# Patient Record
Sex: Female | Born: 1945 | Race: White | Hispanic: No | Smoking: Current every day smoker
Health system: Southern US, Community
[De-identification: ages and names within clinical notes are randomized; demographics above are authoritative.]

## PROBLEM LIST (undated history)

## (undated) DIAGNOSIS — M549 Dorsalgia, unspecified: Secondary | ICD-10-CM

## (undated) DIAGNOSIS — G8929 Other chronic pain: Secondary | ICD-10-CM

## (undated) DIAGNOSIS — K269 Duodenal ulcer, unspecified as acute or chronic, without hemorrhage or perforation: Secondary | ICD-10-CM

## (undated) DIAGNOSIS — J449 Chronic obstructive pulmonary disease, unspecified: Secondary | ICD-10-CM

## (undated) DIAGNOSIS — I10 Essential (primary) hypertension: Secondary | ICD-10-CM

## (undated) DIAGNOSIS — M1990 Unspecified osteoarthritis, unspecified site: Secondary | ICD-10-CM

## (undated) DIAGNOSIS — M810 Age-related osteoporosis without current pathological fracture: Secondary | ICD-10-CM

## (undated) HISTORY — PX: CHOLECYSTECTOMY: SHX55

## (undated) HISTORY — DX: Age-related osteoporosis without current pathological fracture: M81.0

## (undated) HISTORY — DX: Essential (primary) hypertension: I10

## (undated) HISTORY — PX: COLONOSCOPY: SHX174

## (undated) HISTORY — PX: OOPHORECTOMY: SHX86

## (undated) HISTORY — DX: Duodenal ulcer, unspecified as acute or chronic, without hemorrhage or perforation: K26.9

## (undated) HISTORY — PX: ABDOMINAL HYSTERECTOMY: SUR658

## (undated) HISTORY — DX: Chronic obstructive pulmonary disease, unspecified: J44.9

## (undated) HISTORY — DX: Unspecified osteoarthritis, unspecified site: M19.90

---

## 2007-11-08 ENCOUNTER — Ambulatory Visit: Payer: Self-pay | Admitting: Otolaryngology

## 2012-01-05 DIAGNOSIS — Z79899 Other long term (current) drug therapy: Secondary | ICD-10-CM | POA: Diagnosis not present

## 2012-01-05 DIAGNOSIS — I10 Essential (primary) hypertension: Secondary | ICD-10-CM | POA: Diagnosis not present

## 2012-01-12 DIAGNOSIS — I10 Essential (primary) hypertension: Secondary | ICD-10-CM | POA: Diagnosis not present

## 2012-01-12 DIAGNOSIS — N289 Disorder of kidney and ureter, unspecified: Secondary | ICD-10-CM | POA: Diagnosis not present

## 2012-01-12 DIAGNOSIS — M545 Low back pain: Secondary | ICD-10-CM | POA: Diagnosis not present

## 2012-01-26 DIAGNOSIS — N289 Disorder of kidney and ureter, unspecified: Secondary | ICD-10-CM | POA: Diagnosis not present

## 2012-05-14 DIAGNOSIS — J069 Acute upper respiratory infection, unspecified: Secondary | ICD-10-CM | POA: Diagnosis not present

## 2012-05-14 DIAGNOSIS — R0602 Shortness of breath: Secondary | ICD-10-CM | POA: Diagnosis not present

## 2012-05-14 DIAGNOSIS — J45909 Unspecified asthma, uncomplicated: Secondary | ICD-10-CM | POA: Diagnosis not present

## 2012-07-12 DIAGNOSIS — S335XXA Sprain of ligaments of lumbar spine, initial encounter: Secondary | ICD-10-CM | POA: Diagnosis not present

## 2012-07-12 DIAGNOSIS — T148XXA Other injury of unspecified body region, initial encounter: Secondary | ICD-10-CM | POA: Diagnosis not present

## 2012-07-12 DIAGNOSIS — S239XXA Sprain of unspecified parts of thorax, initial encounter: Secondary | ICD-10-CM | POA: Diagnosis not present

## 2012-07-12 DIAGNOSIS — M5380 Other specified dorsopathies, site unspecified: Secondary | ICD-10-CM | POA: Diagnosis not present

## 2012-07-12 DIAGNOSIS — F17200 Nicotine dependence, unspecified, uncomplicated: Secondary | ICD-10-CM | POA: Diagnosis not present

## 2013-05-04 DIAGNOSIS — S32009A Unspecified fracture of unspecified lumbar vertebra, initial encounter for closed fracture: Secondary | ICD-10-CM | POA: Diagnosis not present

## 2013-05-04 DIAGNOSIS — M545 Low back pain, unspecified: Secondary | ICD-10-CM | POA: Diagnosis not present

## 2013-05-04 DIAGNOSIS — M5137 Other intervertebral disc degeneration, lumbosacral region: Secondary | ICD-10-CM | POA: Diagnosis not present

## 2013-05-04 DIAGNOSIS — M949 Disorder of cartilage, unspecified: Secondary | ICD-10-CM | POA: Diagnosis not present

## 2013-05-04 DIAGNOSIS — Z79899 Other long term (current) drug therapy: Secondary | ICD-10-CM | POA: Diagnosis not present

## 2013-05-04 DIAGNOSIS — M8448XA Pathological fracture, other site, initial encounter for fracture: Secondary | ICD-10-CM | POA: Diagnosis not present

## 2013-05-04 DIAGNOSIS — M899 Disorder of bone, unspecified: Secondary | ICD-10-CM | POA: Diagnosis not present

## 2013-05-04 DIAGNOSIS — IMO0002 Reserved for concepts with insufficient information to code with codable children: Secondary | ICD-10-CM | POA: Diagnosis not present

## 2013-05-04 DIAGNOSIS — M948X9 Other specified disorders of cartilage, unspecified sites: Secondary | ICD-10-CM | POA: Diagnosis not present

## 2013-05-04 DIAGNOSIS — M5380 Other specified dorsopathies, site unspecified: Secondary | ICD-10-CM | POA: Diagnosis not present

## 2013-05-04 DIAGNOSIS — M4000 Postural kyphosis, site unspecified: Secondary | ICD-10-CM | POA: Diagnosis not present

## 2013-05-04 DIAGNOSIS — F17200 Nicotine dependence, unspecified, uncomplicated: Secondary | ICD-10-CM | POA: Diagnosis not present

## 2013-05-04 DIAGNOSIS — S22009A Unspecified fracture of unspecified thoracic vertebra, initial encounter for closed fracture: Secondary | ICD-10-CM | POA: Diagnosis not present

## 2013-05-04 DIAGNOSIS — M549 Dorsalgia, unspecified: Secondary | ICD-10-CM | POA: Diagnosis not present

## 2013-05-04 DIAGNOSIS — M51379 Other intervertebral disc degeneration, lumbosacral region without mention of lumbar back pain or lower extremity pain: Secondary | ICD-10-CM | POA: Diagnosis not present

## 2013-05-05 DIAGNOSIS — M899 Disorder of bone, unspecified: Secondary | ICD-10-CM | POA: Diagnosis not present

## 2013-05-05 DIAGNOSIS — M5137 Other intervertebral disc degeneration, lumbosacral region: Secondary | ICD-10-CM | POA: Diagnosis not present

## 2013-05-05 DIAGNOSIS — M8448XA Pathological fracture, other site, initial encounter for fracture: Secondary | ICD-10-CM | POA: Diagnosis not present

## 2013-05-05 DIAGNOSIS — M948X9 Other specified disorders of cartilage, unspecified sites: Secondary | ICD-10-CM | POA: Diagnosis not present

## 2013-05-05 DIAGNOSIS — M545 Low back pain, unspecified: Secondary | ICD-10-CM | POA: Diagnosis not present

## 2013-05-05 DIAGNOSIS — M949 Disorder of cartilage, unspecified: Secondary | ICD-10-CM | POA: Diagnosis not present

## 2013-05-05 DIAGNOSIS — M549 Dorsalgia, unspecified: Secondary | ICD-10-CM | POA: Diagnosis not present

## 2013-05-05 DIAGNOSIS — IMO0002 Reserved for concepts with insufficient information to code with codable children: Secondary | ICD-10-CM | POA: Diagnosis not present

## 2013-05-05 DIAGNOSIS — S22009A Unspecified fracture of unspecified thoracic vertebra, initial encounter for closed fracture: Secondary | ICD-10-CM | POA: Diagnosis not present

## 2013-05-05 DIAGNOSIS — M4000 Postural kyphosis, site unspecified: Secondary | ICD-10-CM | POA: Diagnosis not present

## 2013-05-16 DIAGNOSIS — S22009A Unspecified fracture of unspecified thoracic vertebra, initial encounter for closed fracture: Secondary | ICD-10-CM | POA: Diagnosis not present

## 2013-06-24 ENCOUNTER — Ambulatory Visit: Payer: Self-pay | Admitting: Pain Medicine

## 2013-06-24 DIAGNOSIS — M47817 Spondylosis without myelopathy or radiculopathy, lumbosacral region: Secondary | ICD-10-CM | POA: Diagnosis not present

## 2013-06-24 DIAGNOSIS — M4000 Postural kyphosis, site unspecified: Secondary | ICD-10-CM | POA: Diagnosis not present

## 2013-06-24 DIAGNOSIS — M62838 Other muscle spasm: Secondary | ICD-10-CM | POA: Diagnosis not present

## 2013-06-24 DIAGNOSIS — M899 Disorder of bone, unspecified: Secondary | ICD-10-CM | POA: Diagnosis not present

## 2013-06-24 DIAGNOSIS — M4120 Other idiopathic scoliosis, site unspecified: Secondary | ICD-10-CM | POA: Diagnosis not present

## 2013-06-24 DIAGNOSIS — R071 Chest pain on breathing: Secondary | ICD-10-CM | POA: Diagnosis not present

## 2013-06-24 DIAGNOSIS — S22009A Unspecified fracture of unspecified thoracic vertebra, initial encounter for closed fracture: Secondary | ICD-10-CM | POA: Diagnosis not present

## 2013-06-24 DIAGNOSIS — IMO0002 Reserved for concepts with insufficient information to code with codable children: Secondary | ICD-10-CM | POA: Diagnosis not present

## 2013-06-24 DIAGNOSIS — M949 Disorder of cartilage, unspecified: Secondary | ICD-10-CM | POA: Diagnosis not present

## 2013-06-24 DIAGNOSIS — M546 Pain in thoracic spine: Secondary | ICD-10-CM | POA: Diagnosis not present

## 2013-07-08 DIAGNOSIS — R0989 Other specified symptoms and signs involving the circulatory and respiratory systems: Secondary | ICD-10-CM | POA: Diagnosis not present

## 2013-07-08 DIAGNOSIS — R29818 Other symptoms and signs involving the nervous system: Secondary | ICD-10-CM | POA: Diagnosis not present

## 2013-07-08 DIAGNOSIS — R000 Tachycardia, unspecified: Secondary | ICD-10-CM | POA: Diagnosis not present

## 2013-07-08 DIAGNOSIS — R5381 Other malaise: Secondary | ICD-10-CM | POA: Diagnosis not present

## 2013-07-08 DIAGNOSIS — R5383 Other fatigue: Secondary | ICD-10-CM | POA: Diagnosis not present

## 2013-07-15 ENCOUNTER — Encounter: Payer: Self-pay | Admitting: Cardiovascular Disease

## 2013-07-15 ENCOUNTER — Encounter: Payer: Self-pay | Admitting: *Deleted

## 2013-07-15 ENCOUNTER — Ambulatory Visit (INDEPENDENT_AMBULATORY_CARE_PROVIDER_SITE_OTHER): Payer: Medicare Other | Admitting: Cardiovascular Disease

## 2013-07-15 VITALS — BP 138/92 | HR 90 | Ht <= 58 in | Wt 120.5 lb

## 2013-07-15 DIAGNOSIS — I471 Supraventricular tachycardia: Secondary | ICD-10-CM

## 2013-07-15 DIAGNOSIS — I639 Cerebral infarction, unspecified: Secondary | ICD-10-CM

## 2013-07-15 DIAGNOSIS — R002 Palpitations: Secondary | ICD-10-CM

## 2013-07-15 DIAGNOSIS — I6350 Cerebral infarction due to unspecified occlusion or stenosis of unspecified cerebral artery: Secondary | ICD-10-CM | POA: Diagnosis not present

## 2013-07-15 MED ORDER — CYCLOBENZAPRINE HCL 5 MG PO TABS: 5.0000 mg | ORAL_TABLET | Freq: Three times a day (TID) | ORAL | Status: DC | PRN

## 2013-07-15 NOTE — Patient Instructions (Signed)
Your physician has recommended you make the following change in your medication:  Flexeril 5 mg three times daily a needed for pain    Your physician has recommended that you wear a holter monitor. Holter monitors are medical devices that record the heart's electrical activity. Doctors most often use these monitors to diagnose arrhythmias. Arrhythmias are problems with the speed or rhythm of the heartbeat. The monitor is a small, portable device. You can wear one while you do your normal daily activities. This is usually used to diagnose what is causing palpitations/syncope (passing out).   Your physician has requested that you have an echocardiogram. Echocardiography is a painless test that uses sound waves to create images of your heart. It provides your doctor with information about the size and shape of your heart and how well your heart's chambers and valves are working. This procedure takes approximately one hour. There are no restrictions for this procedure.  Your physician has requested that you have a carotid duplex. This test is an ultrasound of the carotid arteries in your neck. It looks at blood flow through these arteries that supply the brain with blood. Allow one hour for this exam. There are no restrictions or special instructions.   Please have echo and carotid duplex on the same day ASAP  We will place your holter at that time

## 2013-07-17 ENCOUNTER — Ambulatory Visit (HOSPITAL_COMMUNITY)
Admission: RE | Admit: 2013-07-17 | Discharge: 2013-07-17 | Disposition: A | Discharge status: Home / Self Care | Payer: Medicare Other | Source: Ambulatory Visit | Attending: Cardiovascular Disease | Admitting: Cardiovascular Disease

## 2013-07-17 ENCOUNTER — Encounter (INDEPENDENT_AMBULATORY_CARE_PROVIDER_SITE_OTHER): Payer: Medicare Other

## 2013-07-17 ENCOUNTER — Encounter: Payer: Self-pay | Admitting: Radiology

## 2013-07-17 ENCOUNTER — Ambulatory Visit (HOSPITAL_BASED_OUTPATIENT_CLINIC_OR_DEPARTMENT_OTHER)
Admission: RE | Admit: 2013-07-17 | Discharge: 2013-07-17 | Disposition: A | Discharge status: Home / Self Care | Payer: Medicare Other | Source: Ambulatory Visit | Attending: Cardiovascular Disease | Admitting: Cardiovascular Disease

## 2013-07-17 ENCOUNTER — Encounter: Payer: Self-pay | Admitting: Cardiovascular Disease

## 2013-07-17 DIAGNOSIS — I639 Cerebral infarction, unspecified: Secondary | ICD-10-CM

## 2013-07-17 DIAGNOSIS — I6350 Cerebral infarction due to unspecified occlusion or stenosis of unspecified cerebral artery: Secondary | ICD-10-CM | POA: Diagnosis present

## 2013-07-17 DIAGNOSIS — R002 Palpitations: Secondary | ICD-10-CM | POA: Diagnosis not present

## 2013-07-17 DIAGNOSIS — I517 Cardiomegaly: Secondary | ICD-10-CM | POA: Diagnosis not present

## 2013-07-17 DIAGNOSIS — R0989 Other specified symptoms and signs involving the circulatory and respiratory systems: Secondary | ICD-10-CM | POA: Diagnosis not present

## 2013-07-17 NOTE — Progress Notes (Signed)
Primary care physician:Dr. Jacqualine Code  HPI  This is a pleasant 68 year old female who was referred for cardiac evaluation regarding suspected TIA in the right carotid bruit. The patient has been healthy throughout her life. She is a retired Therapist, sports. She fell in March and had fractures in her neck and back. She had significant controlled pain since then. She was evaluated by Dr. Primus Bravo at pain management who is considering performing nerve block. In mid May, she had an episode of slurred speech, difficulty swallowing and facial droop which lasted for about 24 hours. She did not seek medical attention. She denies any chest pain or dyspnea. She does complain of almost daily palpitations which she attributes to being continuously and pain. She has no history of diabetes or hypertension. She does smoke about 10 cigarettes per day and has been doing so for about 50 years. She has no family history of premature coronary artery disease. She was noted to have right carotid bruit. She had recent labs performed which overall was unremarkable.  Allergies  Allergen Reactions  . Valium [Diazepam]      No current outpatient prescriptions on file prior to visit.   No current facility-administered medications on file prior to visit.     History reviewed. No pertinent past medical history.   Past Surgical History  Procedure Laterality Date  . Cholecystectomy    . Oophorectomy    . Colonoscopy       History reviewed. No pertinent family history.   History   Social History  . Marital Status: Widowed    Spouse Name: N/A    Number of Children: N/A  . Years of Education: N/A   Occupational History  . Not on file.   Social History Main Topics  . Smoking status: Current Every Day Smoker -- 1.00 packs/day for 50 years    Types: Cigarettes  . Smokeless tobacco: Not on file  . Alcohol Use: No     Comment: occasional  . Drug Use: No  . Sexual Activity: Not on file   Other Topics Concern  . Not on  file   Social History Narrative  . No narrative on file     ROS A 10 point review of system was performed. It is negative other than that mentioned in the history of present illness.   PHYSICAL EXAM   BP 138/92  Pulse 90  Ht 4\' 10"  (1.473 m)  Wt 120 lb 8 oz (54.658 kg)  BMI 25.19 kg/m2 Constitutional: She is oriented to person, place, and time. She appears well-developed and well-nourished. No distress.  HENT: No nasal discharge.  Head: Normocephalic and atraumatic.  Eyes: Pupils are equal and round. No discharge.  Neck: Normal range of motion. Neck supple. No JVD present. No thyromegaly present. Faint right carotid bruit  Cardiovascular: Normal rate, regular rhythm, normal heart sounds. Exam reveals no gallop and no friction rub. No murmur heard.  Pulmonary/Chest: Effort normal and breath sounds normal. No stridor. No respiratory distress. She has no wheezes. She has no rales. She exhibits no tenderness.  Abdominal: Soft. Bowel sounds are normal. She exhibits no distension. There is no tenderness. There is no rebound and no guarding.  Musculoskeletal: Normal range of motion. She exhibits no edema and no tenderness.  Neurological: She is alert and oriented to person, place, and time. Coordination normal.  Skin: Skin is warm and dry. No rash noted. She is not diaphoretic. No erythema. No pallor.  Psychiatric: She has a normal mood and affect.  Her behavior is normal. Judgment and thought content normal.     AYT:KZSWF  Rhythm  WITHIN NORMAL LIMITS    ASSESSMENT AND PLAN

## 2013-07-17 NOTE — Progress Notes (Signed)
Patient ID: Melissa Watkins, female   DOB: December 21, 1945, 68 y.o.   MRN: [id number] E cardio 48hr holter monitor

## 2013-07-17 NOTE — Assessment & Plan Note (Signed)
The patient's symptoms in May are highly suggestive of TIA . She has a faint right carotid bruit. Thus, I requested carotid Doppler. I also requested an echocardiogram to evaluate for cardiac source of embolism. In the meanwhile, continue aspirin. Consider a brain MRI. However, I am not certain she would be able to hold still given her significant back discomfort.

## 2013-07-17 NOTE — Assessment & Plan Note (Addendum)
This could be due to sinus tachycardia related from uncontrolled pain. However, with suspected CVA, I think we have to exclude fibrillation. I requested a 48-hour Holter monitor for evaluation. Due to significant back pain, I provided her with short course of Flexeril. Side effects were explained.

## 2013-07-17 NOTE — Progress Notes (Signed)
Carotid Duplex Completed. Preliminary results by tech - A mild amount of nonhemodynamic calcified plaque was visualized in the bilateral ICAs.  Oda Cogan, BS, RDMS, RVT

## 2013-07-17 NOTE — Progress Notes (Signed)
2D Echo Performed 07/17/2013    Marygrace Drought, RCS

## 2013-07-24 ENCOUNTER — Telehealth: Payer: Self-pay

## 2013-07-24 ENCOUNTER — Telehealth: Payer: Self-pay | Admitting: *Deleted

## 2013-07-24 NOTE — Telephone Encounter (Signed)
Daughter called wanting u/s results today. If unable to call today then call this number tomorrow [phone]

## 2013-07-24 NOTE — Telephone Encounter (Signed)
Pt daughter in law called and would like carotid results. Please call.

## 2013-07-25 NOTE — Telephone Encounter (Signed)
Carotid doppler showed mild plaque build up bilaterally without significant blockages.

## 2013-07-25 NOTE — Telephone Encounter (Signed)
We need to locate the study and see why it has not been read. Please check with Missy . We have to get the results today.

## 2013-07-25 NOTE — Telephone Encounter (Signed)
Results are scanned in now.

## 2013-07-25 NOTE — Telephone Encounter (Signed)
Reviewed results w/ pt's daugther-in-law. She verbalizes understanding and will call w/ further questions or concerns.

## 2013-07-29 ENCOUNTER — Encounter: Payer: Self-pay | Admitting: *Deleted

## 2013-07-29 ENCOUNTER — Other Ambulatory Visit: Payer: Medicare Other

## 2013-07-29 ENCOUNTER — Telehealth: Payer: Self-pay | Admitting: *Deleted

## 2013-07-29 NOTE — Telephone Encounter (Signed)
She is cleared. Please send a clearance letter.

## 2013-07-29 NOTE — Telephone Encounter (Signed)
I spoke with daughter-in-law is aware of normal sinus rhythm results of monitor. Pt will be going to the pain clinic tomorrow for nerve block injection  She would like Dr. Fletcher Anon to communicate with Dr. Mohammed Kindle [facility] pain management clinic) that she is cleared for the injection tomorrow at [place]

## 2013-07-29 NOTE — Telephone Encounter (Signed)
Spoke with Caryl Pina at Dr. Primus Bravo office & I have faxed clearance letter to them at [place]

## 2013-07-30 ENCOUNTER — Ambulatory Visit: Payer: Self-pay | Admitting: Pain Medicine

## 2013-07-30 DIAGNOSIS — M546 Pain in thoracic spine: Secondary | ICD-10-CM | POA: Diagnosis not present

## 2013-07-30 DIAGNOSIS — M949 Disorder of cartilage, unspecified: Secondary | ICD-10-CM | POA: Diagnosis not present

## 2013-07-30 DIAGNOSIS — M62838 Other muscle spasm: Secondary | ICD-10-CM | POA: Diagnosis not present

## 2013-07-30 DIAGNOSIS — R071 Chest pain on breathing: Secondary | ICD-10-CM | POA: Diagnosis not present

## 2013-07-30 DIAGNOSIS — M542 Cervicalgia: Secondary | ICD-10-CM | POA: Diagnosis not present

## 2013-07-30 DIAGNOSIS — M899 Disorder of bone, unspecified: Secondary | ICD-10-CM | POA: Diagnosis not present

## 2013-07-30 DIAGNOSIS — M4120 Other idiopathic scoliosis, site unspecified: Secondary | ICD-10-CM | POA: Diagnosis not present

## 2013-07-30 DIAGNOSIS — M545 Low back pain, unspecified: Secondary | ICD-10-CM | POA: Diagnosis not present

## 2013-07-30 DIAGNOSIS — Z79899 Other long term (current) drug therapy: Secondary | ICD-10-CM | POA: Diagnosis not present

## 2013-08-26 ENCOUNTER — Ambulatory Visit: Payer: Self-pay | Admitting: Pain Medicine

## 2013-08-26 DIAGNOSIS — M4000 Postural kyphosis, site unspecified: Secondary | ICD-10-CM | POA: Diagnosis not present

## 2013-08-26 DIAGNOSIS — M47817 Spondylosis without myelopathy or radiculopathy, lumbosacral region: Secondary | ICD-10-CM | POA: Diagnosis not present

## 2013-08-26 DIAGNOSIS — M4120 Other idiopathic scoliosis, site unspecified: Secondary | ICD-10-CM | POA: Diagnosis not present

## 2013-08-26 DIAGNOSIS — M47814 Spondylosis without myelopathy or radiculopathy, thoracic region: Secondary | ICD-10-CM | POA: Diagnosis not present

## 2013-08-26 DIAGNOSIS — S32009A Unspecified fracture of unspecified lumbar vertebra, initial encounter for closed fracture: Secondary | ICD-10-CM | POA: Diagnosis not present

## 2013-08-26 DIAGNOSIS — S22009A Unspecified fracture of unspecified thoracic vertebra, initial encounter for closed fracture: Secondary | ICD-10-CM | POA: Diagnosis not present

## 2013-08-26 DIAGNOSIS — M62838 Other muscle spasm: Secondary | ICD-10-CM | POA: Diagnosis not present

## 2013-08-26 DIAGNOSIS — M899 Disorder of bone, unspecified: Secondary | ICD-10-CM | POA: Diagnosis not present

## 2013-08-26 DIAGNOSIS — IMO0002 Reserved for concepts with insufficient information to code with codable children: Secondary | ICD-10-CM | POA: Diagnosis not present

## 2013-08-26 DIAGNOSIS — R030 Elevated blood-pressure reading, without diagnosis of hypertension: Secondary | ICD-10-CM | POA: Diagnosis not present

## 2013-08-26 DIAGNOSIS — Z79899 Other long term (current) drug therapy: Secondary | ICD-10-CM | POA: Diagnosis not present

## 2013-08-26 DIAGNOSIS — R071 Chest pain on breathing: Secondary | ICD-10-CM | POA: Diagnosis not present

## 2013-09-06 DIAGNOSIS — W19XXXA Unspecified fall, initial encounter: Secondary | ICD-10-CM | POA: Diagnosis not present

## 2013-09-06 DIAGNOSIS — M25519 Pain in unspecified shoulder: Secondary | ICD-10-CM | POA: Diagnosis not present

## 2013-09-06 DIAGNOSIS — M542 Cervicalgia: Secondary | ICD-10-CM | POA: Diagnosis not present

## 2013-09-06 DIAGNOSIS — Z8673 Personal history of transient ischemic attack (TIA), and cerebral infarction without residual deficits: Secondary | ICD-10-CM | POA: Diagnosis not present

## 2013-09-06 DIAGNOSIS — M4802 Spinal stenosis, cervical region: Secondary | ICD-10-CM | POA: Diagnosis not present

## 2013-09-06 DIAGNOSIS — M5030 Other cervical disc degeneration, unspecified cervical region: Secondary | ICD-10-CM | POA: Diagnosis not present

## 2013-09-06 DIAGNOSIS — IMO0002 Reserved for concepts with insufficient information to code with codable children: Secondary | ICD-10-CM | POA: Diagnosis not present

## 2013-09-06 DIAGNOSIS — M25559 Pain in unspecified hip: Secondary | ICD-10-CM | POA: Diagnosis not present

## 2013-09-06 DIAGNOSIS — M949 Disorder of cartilage, unspecified: Secondary | ICD-10-CM | POA: Diagnosis not present

## 2013-09-06 DIAGNOSIS — M810 Age-related osteoporosis without current pathological fracture: Secondary | ICD-10-CM | POA: Diagnosis not present

## 2013-09-06 DIAGNOSIS — F17200 Nicotine dependence, unspecified, uncomplicated: Secondary | ICD-10-CM | POA: Diagnosis not present

## 2013-09-06 DIAGNOSIS — S46909A Unspecified injury of unspecified muscle, fascia and tendon at shoulder and upper arm level, unspecified arm, initial encounter: Secondary | ICD-10-CM | POA: Diagnosis not present

## 2013-09-06 DIAGNOSIS — S298XXA Other specified injuries of thorax, initial encounter: Secondary | ICD-10-CM | POA: Diagnosis not present

## 2013-09-06 DIAGNOSIS — T148XXA Other injury of unspecified body region, initial encounter: Secondary | ICD-10-CM | POA: Diagnosis not present

## 2013-09-06 DIAGNOSIS — Z043 Encounter for examination and observation following other accident: Secondary | ICD-10-CM | POA: Diagnosis not present

## 2013-09-06 DIAGNOSIS — Z9181 History of falling: Secondary | ICD-10-CM | POA: Diagnosis not present

## 2013-09-06 DIAGNOSIS — S4980XA Other specified injuries of shoulder and upper arm, unspecified arm, initial encounter: Secondary | ICD-10-CM | POA: Diagnosis not present

## 2013-09-06 DIAGNOSIS — M899 Disorder of bone, unspecified: Secondary | ICD-10-CM | POA: Diagnosis not present

## 2014-05-11 DIAGNOSIS — S22080D Wedge compression fracture of T11-T12 vertebra, subsequent encounter for fracture with routine healing: Secondary | ICD-10-CM | POA: Diagnosis not present

## 2014-05-11 DIAGNOSIS — S22070D Wedge compression fracture of T9-T10 vertebra, subsequent encounter for fracture with routine healing: Secondary | ICD-10-CM | POA: Diagnosis not present

## 2014-05-16 DIAGNOSIS — R2689 Other abnormalities of gait and mobility: Secondary | ICD-10-CM | POA: Diagnosis not present

## 2014-05-16 DIAGNOSIS — S22009S Unspecified fracture of unspecified thoracic vertebra, sequela: Secondary | ICD-10-CM | POA: Diagnosis not present

## 2014-05-16 DIAGNOSIS — Z7982 Long term (current) use of aspirin: Secondary | ICD-10-CM | POA: Diagnosis not present

## 2014-05-16 DIAGNOSIS — W19XXXS Unspecified fall, sequela: Secondary | ICD-10-CM | POA: Diagnosis not present

## 2014-05-16 DIAGNOSIS — Z87891 Personal history of nicotine dependence: Secondary | ICD-10-CM | POA: Diagnosis not present

## 2014-05-20 DIAGNOSIS — S22009S Unspecified fracture of unspecified thoracic vertebra, sequela: Secondary | ICD-10-CM | POA: Diagnosis not present

## 2014-05-20 DIAGNOSIS — W19XXXS Unspecified fall, sequela: Secondary | ICD-10-CM | POA: Diagnosis not present

## 2014-05-20 DIAGNOSIS — Z7982 Long term (current) use of aspirin: Secondary | ICD-10-CM | POA: Diagnosis not present

## 2014-05-20 DIAGNOSIS — R2689 Other abnormalities of gait and mobility: Secondary | ICD-10-CM | POA: Diagnosis not present

## 2014-05-20 DIAGNOSIS — Z87891 Personal history of nicotine dependence: Secondary | ICD-10-CM | POA: Diagnosis not present

## 2014-05-22 DIAGNOSIS — W19XXXS Unspecified fall, sequela: Secondary | ICD-10-CM | POA: Diagnosis not present

## 2014-05-22 DIAGNOSIS — Z87891 Personal history of nicotine dependence: Secondary | ICD-10-CM | POA: Diagnosis not present

## 2014-05-22 DIAGNOSIS — S22009S Unspecified fracture of unspecified thoracic vertebra, sequela: Secondary | ICD-10-CM | POA: Diagnosis not present

## 2014-05-22 DIAGNOSIS — Z7982 Long term (current) use of aspirin: Secondary | ICD-10-CM | POA: Diagnosis not present

## 2014-05-22 DIAGNOSIS — R2689 Other abnormalities of gait and mobility: Secondary | ICD-10-CM | POA: Diagnosis not present

## 2014-05-26 DIAGNOSIS — Z7982 Long term (current) use of aspirin: Secondary | ICD-10-CM | POA: Diagnosis not present

## 2014-05-26 DIAGNOSIS — S22009S Unspecified fracture of unspecified thoracic vertebra, sequela: Secondary | ICD-10-CM | POA: Diagnosis not present

## 2014-05-26 DIAGNOSIS — W19XXXS Unspecified fall, sequela: Secondary | ICD-10-CM | POA: Diagnosis not present

## 2014-05-26 DIAGNOSIS — R2689 Other abnormalities of gait and mobility: Secondary | ICD-10-CM | POA: Diagnosis not present

## 2014-05-26 DIAGNOSIS — Z87891 Personal history of nicotine dependence: Secondary | ICD-10-CM | POA: Diagnosis not present

## 2014-05-28 DIAGNOSIS — Z87891 Personal history of nicotine dependence: Secondary | ICD-10-CM | POA: Diagnosis not present

## 2014-05-28 DIAGNOSIS — S22009S Unspecified fracture of unspecified thoracic vertebra, sequela: Secondary | ICD-10-CM | POA: Diagnosis not present

## 2014-05-28 DIAGNOSIS — Z7982 Long term (current) use of aspirin: Secondary | ICD-10-CM | POA: Diagnosis not present

## 2014-05-28 DIAGNOSIS — R2689 Other abnormalities of gait and mobility: Secondary | ICD-10-CM | POA: Diagnosis not present

## 2014-05-28 DIAGNOSIS — W19XXXS Unspecified fall, sequela: Secondary | ICD-10-CM | POA: Diagnosis not present

## 2014-06-02 DIAGNOSIS — Z7982 Long term (current) use of aspirin: Secondary | ICD-10-CM | POA: Diagnosis not present

## 2014-06-02 DIAGNOSIS — Z87891 Personal history of nicotine dependence: Secondary | ICD-10-CM | POA: Diagnosis not present

## 2014-06-02 DIAGNOSIS — W19XXXS Unspecified fall, sequela: Secondary | ICD-10-CM | POA: Diagnosis not present

## 2014-06-02 DIAGNOSIS — S22009S Unspecified fracture of unspecified thoracic vertebra, sequela: Secondary | ICD-10-CM | POA: Diagnosis not present

## 2014-06-02 DIAGNOSIS — R2689 Other abnormalities of gait and mobility: Secondary | ICD-10-CM | POA: Diagnosis not present

## 2014-06-04 DIAGNOSIS — S22009S Unspecified fracture of unspecified thoracic vertebra, sequela: Secondary | ICD-10-CM | POA: Diagnosis not present

## 2014-06-04 DIAGNOSIS — Z87891 Personal history of nicotine dependence: Secondary | ICD-10-CM | POA: Diagnosis not present

## 2014-06-04 DIAGNOSIS — Z7982 Long term (current) use of aspirin: Secondary | ICD-10-CM | POA: Diagnosis not present

## 2014-06-04 DIAGNOSIS — W19XXXS Unspecified fall, sequela: Secondary | ICD-10-CM | POA: Diagnosis not present

## 2014-06-04 DIAGNOSIS — R2689 Other abnormalities of gait and mobility: Secondary | ICD-10-CM | POA: Diagnosis not present

## 2014-06-09 DIAGNOSIS — R2689 Other abnormalities of gait and mobility: Secondary | ICD-10-CM | POA: Diagnosis not present

## 2014-06-09 DIAGNOSIS — Z87891 Personal history of nicotine dependence: Secondary | ICD-10-CM | POA: Diagnosis not present

## 2014-06-09 DIAGNOSIS — W19XXXS Unspecified fall, sequela: Secondary | ICD-10-CM | POA: Diagnosis not present

## 2014-06-09 DIAGNOSIS — Z7982 Long term (current) use of aspirin: Secondary | ICD-10-CM | POA: Diagnosis not present

## 2014-06-09 DIAGNOSIS — S22009S Unspecified fracture of unspecified thoracic vertebra, sequela: Secondary | ICD-10-CM | POA: Diagnosis not present

## 2014-06-11 DIAGNOSIS — R2689 Other abnormalities of gait and mobility: Secondary | ICD-10-CM | POA: Diagnosis not present

## 2014-06-11 DIAGNOSIS — Z87891 Personal history of nicotine dependence: Secondary | ICD-10-CM | POA: Diagnosis not present

## 2014-06-11 DIAGNOSIS — W19XXXS Unspecified fall, sequela: Secondary | ICD-10-CM | POA: Diagnosis not present

## 2014-06-11 DIAGNOSIS — S22009S Unspecified fracture of unspecified thoracic vertebra, sequela: Secondary | ICD-10-CM | POA: Diagnosis not present

## 2014-06-11 DIAGNOSIS — Z7982 Long term (current) use of aspirin: Secondary | ICD-10-CM | POA: Diagnosis not present

## 2014-06-18 DIAGNOSIS — W19XXXS Unspecified fall, sequela: Secondary | ICD-10-CM | POA: Diagnosis not present

## 2014-06-18 DIAGNOSIS — S22009S Unspecified fracture of unspecified thoracic vertebra, sequela: Secondary | ICD-10-CM | POA: Diagnosis not present

## 2014-06-18 DIAGNOSIS — Z87891 Personal history of nicotine dependence: Secondary | ICD-10-CM | POA: Diagnosis not present

## 2014-06-18 DIAGNOSIS — Z7982 Long term (current) use of aspirin: Secondary | ICD-10-CM | POA: Diagnosis not present

## 2014-06-18 DIAGNOSIS — R2689 Other abnormalities of gait and mobility: Secondary | ICD-10-CM | POA: Diagnosis not present

## 2014-06-23 DIAGNOSIS — Z7982 Long term (current) use of aspirin: Secondary | ICD-10-CM | POA: Diagnosis not present

## 2014-06-23 DIAGNOSIS — R2689 Other abnormalities of gait and mobility: Secondary | ICD-10-CM | POA: Diagnosis not present

## 2014-06-23 DIAGNOSIS — S22009S Unspecified fracture of unspecified thoracic vertebra, sequela: Secondary | ICD-10-CM | POA: Diagnosis not present

## 2014-06-23 DIAGNOSIS — Z87891 Personal history of nicotine dependence: Secondary | ICD-10-CM | POA: Diagnosis not present

## 2014-06-23 DIAGNOSIS — W19XXXS Unspecified fall, sequela: Secondary | ICD-10-CM | POA: Diagnosis not present

## 2014-06-25 DIAGNOSIS — R2689 Other abnormalities of gait and mobility: Secondary | ICD-10-CM | POA: Diagnosis not present

## 2014-06-25 DIAGNOSIS — S22009S Unspecified fracture of unspecified thoracic vertebra, sequela: Secondary | ICD-10-CM | POA: Diagnosis not present

## 2014-06-25 DIAGNOSIS — Z87891 Personal history of nicotine dependence: Secondary | ICD-10-CM | POA: Diagnosis not present

## 2014-06-25 DIAGNOSIS — W19XXXS Unspecified fall, sequela: Secondary | ICD-10-CM | POA: Diagnosis not present

## 2014-06-25 DIAGNOSIS — Z7982 Long term (current) use of aspirin: Secondary | ICD-10-CM | POA: Diagnosis not present

## 2016-01-31 DIAGNOSIS — K269 Duodenal ulcer, unspecified as acute or chronic, without hemorrhage or perforation: Secondary | ICD-10-CM

## 2016-01-31 HISTORY — DX: Duodenal ulcer, unspecified as acute or chronic, without hemorrhage or perforation: K26.9

## 2016-01-31 HISTORY — PX: HIP FRACTURE SURGERY: SHX118

## 2016-02-07 ENCOUNTER — Encounter: Payer: Self-pay | Admitting: Emergency Medicine

## 2016-02-07 ENCOUNTER — Encounter
Admission: EM | Disposition: A | Discharge status: Skilled Nursing Facility | Payer: Self-pay | Source: Home / Self Care | Attending: Internal Medicine

## 2016-02-07 ENCOUNTER — Inpatient Hospital Stay: Payer: Medicare Other | Admitting: Anesthesiology

## 2016-02-07 ENCOUNTER — Inpatient Hospital Stay: Payer: Medicare Other

## 2016-02-07 ENCOUNTER — Emergency Department: Payer: Medicare Other

## 2016-02-07 ENCOUNTER — Inpatient Hospital Stay
Admission: EM | Admit: 2016-02-07 | Discharge: 2016-02-10 | DRG: 470 | Disposition: A | Discharge status: Skilled Nursing Facility | Payer: Medicare Other | Attending: Internal Medicine | Admitting: Internal Medicine

## 2016-02-07 DIAGNOSIS — M549 Dorsalgia, unspecified: Secondary | ICD-10-CM | POA: Diagnosis present

## 2016-02-07 DIAGNOSIS — Z888 Allergy status to other drugs, medicaments and biological substances status: Secondary | ICD-10-CM

## 2016-02-07 DIAGNOSIS — R296 Repeated falls: Secondary | ICD-10-CM | POA: Diagnosis present

## 2016-02-07 DIAGNOSIS — Y92009 Unspecified place in unspecified non-institutional (private) residence as the place of occurrence of the external cause: Secondary | ICD-10-CM

## 2016-02-07 DIAGNOSIS — I10 Essential (primary) hypertension: Secondary | ICD-10-CM | POA: Diagnosis present

## 2016-02-07 DIAGNOSIS — S72011A Unspecified intracapsular fracture of right femur, initial encounter for closed fracture: Secondary | ICD-10-CM | POA: Diagnosis not present

## 2016-02-07 DIAGNOSIS — R52 Pain, unspecified: Secondary | ICD-10-CM

## 2016-02-07 DIAGNOSIS — W19XXXA Unspecified fall, initial encounter: Secondary | ICD-10-CM | POA: Diagnosis not present

## 2016-02-07 DIAGNOSIS — B9620 Unspecified Escherichia coli [E. coli] as the cause of diseases classified elsewhere: Secondary | ICD-10-CM | POA: Diagnosis present

## 2016-02-07 DIAGNOSIS — E860 Dehydration: Secondary | ICD-10-CM | POA: Diagnosis present

## 2016-02-07 DIAGNOSIS — M1990 Unspecified osteoarthritis, unspecified site: Secondary | ICD-10-CM | POA: Diagnosis present

## 2016-02-07 DIAGNOSIS — F17210 Nicotine dependence, cigarettes, uncomplicated: Secondary | ICD-10-CM | POA: Diagnosis present

## 2016-02-07 DIAGNOSIS — S72141A Displaced intertrochanteric fracture of right femur, initial encounter for closed fracture: Secondary | ICD-10-CM | POA: Diagnosis not present

## 2016-02-07 DIAGNOSIS — M546 Pain in thoracic spine: Secondary | ICD-10-CM | POA: Diagnosis not present

## 2016-02-07 DIAGNOSIS — Z8673 Personal history of transient ischemic attack (TIA), and cerebral infarction without residual deficits: Secondary | ICD-10-CM | POA: Diagnosis not present

## 2016-02-07 DIAGNOSIS — Z885 Allergy status to narcotic agent status: Secondary | ICD-10-CM | POA: Diagnosis not present

## 2016-02-07 DIAGNOSIS — Z471 Aftercare following joint replacement surgery: Secondary | ICD-10-CM | POA: Diagnosis not present

## 2016-02-07 DIAGNOSIS — G8929 Other chronic pain: Secondary | ICD-10-CM | POA: Diagnosis present

## 2016-02-07 DIAGNOSIS — Z7982 Long term (current) use of aspirin: Secondary | ICD-10-CM

## 2016-02-07 DIAGNOSIS — Z01811 Encounter for preprocedural respiratory examination: Secondary | ICD-10-CM

## 2016-02-07 DIAGNOSIS — S79911A Unspecified injury of right hip, initial encounter: Secondary | ICD-10-CM | POA: Diagnosis not present

## 2016-02-07 DIAGNOSIS — Z7401 Bed confinement status: Secondary | ICD-10-CM | POA: Diagnosis not present

## 2016-02-07 DIAGNOSIS — R6889 Other general symptoms and signs: Secondary | ICD-10-CM | POA: Diagnosis not present

## 2016-02-07 DIAGNOSIS — S72001A Fracture of unspecified part of neck of right femur, initial encounter for closed fracture: Principal | ICD-10-CM | POA: Diagnosis present

## 2016-02-07 DIAGNOSIS — Z96641 Presence of right artificial hip joint: Secondary | ICD-10-CM | POA: Diagnosis not present

## 2016-02-07 DIAGNOSIS — Z96649 Presence of unspecified artificial hip joint: Secondary | ICD-10-CM

## 2016-02-07 DIAGNOSIS — M25551 Pain in right hip: Secondary | ICD-10-CM | POA: Diagnosis not present

## 2016-02-07 DIAGNOSIS — S299XXA Unspecified injury of thorax, initial encounter: Secondary | ICD-10-CM | POA: Diagnosis not present

## 2016-02-07 DIAGNOSIS — M84459A Pathological fracture, hip, unspecified, initial encounter for fracture: Secondary | ICD-10-CM | POA: Diagnosis not present

## 2016-02-07 DIAGNOSIS — M47812 Spondylosis without myelopathy or radiculopathy, cervical region: Secondary | ICD-10-CM | POA: Diagnosis not present

## 2016-02-07 DIAGNOSIS — D72829 Elevated white blood cell count, unspecified: Secondary | ICD-10-CM | POA: Diagnosis not present

## 2016-02-07 DIAGNOSIS — M25559 Pain in unspecified hip: Secondary | ICD-10-CM | POA: Diagnosis not present

## 2016-02-07 DIAGNOSIS — S72009A Fracture of unspecified part of neck of unspecified femur, initial encounter for closed fracture: Secondary | ICD-10-CM | POA: Diagnosis present

## 2016-02-07 DIAGNOSIS — S72001D Fracture of unspecified part of neck of right femur, subsequent encounter for closed fracture with routine healing: Secondary | ICD-10-CM | POA: Diagnosis not present

## 2016-02-07 DIAGNOSIS — S7291XA Unspecified fracture of right femur, initial encounter for closed fracture: Secondary | ICD-10-CM | POA: Diagnosis not present

## 2016-02-07 DIAGNOSIS — N390 Urinary tract infection, site not specified: Secondary | ICD-10-CM | POA: Diagnosis present

## 2016-02-07 DIAGNOSIS — E876 Hypokalemia: Secondary | ICD-10-CM | POA: Diagnosis present

## 2016-02-07 DIAGNOSIS — W010XXA Fall on same level from slipping, tripping and stumbling without subsequent striking against object, initial encounter: Secondary | ICD-10-CM | POA: Diagnosis present

## 2016-02-07 DIAGNOSIS — M545 Low back pain: Secondary | ICD-10-CM | POA: Diagnosis not present

## 2016-02-07 DIAGNOSIS — D62 Acute posthemorrhagic anemia: Secondary | ICD-10-CM | POA: Diagnosis not present

## 2016-02-07 DIAGNOSIS — Z79899 Other long term (current) drug therapy: Secondary | ICD-10-CM | POA: Diagnosis not present

## 2016-02-07 DIAGNOSIS — M6281 Muscle weakness (generalized): Secondary | ICD-10-CM

## 2016-02-07 HISTORY — DX: Other chronic pain: G89.29

## 2016-02-07 HISTORY — DX: Dorsalgia, unspecified: M54.9

## 2016-02-07 HISTORY — PX: HIP ARTHROPLASTY: SHX981

## 2016-02-07 LAB — BASIC METABOLIC PANEL
ANION GAP: 12 (ref 5–15)
BUN: 31 mg/dL — AB (ref 6–20)
CALCIUM: 9 mg/dL (ref 8.9–10.3)
CO2: 22 mmol/L (ref 22–32)
Chloride: 105 mmol/L (ref 101–111)
Creatinine, Ser: 1.16 mg/dL — ABNORMAL HIGH (ref 0.44–1.00)
GFR calc Af Amer: 54 mL/min — ABNORMAL LOW (ref >60)
GFR, EST NON AFRICAN AMERICAN: 47 mL/min — AB (ref >60)
Glucose, Bld: 126 mg/dL — ABNORMAL HIGH (ref 65–99)
POTASSIUM: 3.6 mmol/L (ref 3.5–5.1)
SODIUM: 139 mmol/L (ref 135–145)

## 2016-02-07 LAB — CBC
HEMATOCRIT: 40 % (ref 35.0–47.0)
Hemoglobin: 13.3 g/dL (ref 12.0–16.0)
MCH: 30.5 pg (ref 26.0–34.0)
MCHC: 33.4 g/dL (ref 32.0–36.0)
MCV: 91.3 fL (ref 80.0–100.0)
Platelets: 227 10*3/uL (ref 150–440)
RBC: 4.38 MIL/uL (ref 3.80–5.20)
RDW: 14 % (ref 11.5–14.5)
WBC: 15.2 10*3/uL — AB (ref 3.6–11.0)

## 2016-02-07 LAB — CBC WITH DIFFERENTIAL/PLATELET
BASOS PCT: 1 %
Basophils Absolute: 0.2 10*3/uL — ABNORMAL HIGH (ref 0–0.1)
EOS ABS: 0 10*3/uL (ref 0–0.7)
EOS PCT: 0 %
HCT: 41.7 % (ref 35.0–47.0)
HEMOGLOBIN: 13.9 g/dL (ref 12.0–16.0)
LYMPHS ABS: 0.7 10*3/uL — AB (ref 1.0–3.6)
Lymphocytes Relative: 5 %
MCH: 30.1 pg (ref 26.0–34.0)
MCHC: 33.3 g/dL (ref 32.0–36.0)
MCV: 90.5 fL (ref 80.0–100.0)
MONO ABS: 0.8 10*3/uL (ref 0.2–0.9)
MONOS PCT: 5 %
NEUTROS PCT: 89 %
Neutro Abs: 12.7 10*3/uL — ABNORMAL HIGH (ref 1.4–6.5)
Platelets: 209 10*3/uL (ref 150–440)
RBC: 4.61 MIL/uL (ref 3.80–5.20)
RDW: 14.1 % (ref 11.5–14.5)
WBC: 14.3 10*3/uL — ABNORMAL HIGH (ref 3.6–11.0)

## 2016-02-07 LAB — COMPREHENSIVE METABOLIC PANEL
ALBUMIN: 3.7 g/dL (ref 3.5–5.0)
ALK PHOS: 80 U/L (ref 38–126)
ALT: 20 U/L (ref 14–54)
AST: 36 U/L (ref 15–41)
Anion gap: 9 (ref 5–15)
BUN: 28 mg/dL — AB (ref 6–20)
CO2: 24 mmol/L (ref 22–32)
CREATININE: 1.21 mg/dL — AB (ref 0.44–1.00)
Calcium: 9 mg/dL (ref 8.9–10.3)
Chloride: 106 mmol/L (ref 101–111)
GFR calc Af Amer: 52 mL/min — ABNORMAL LOW (ref >60)
GFR calc non Af Amer: 45 mL/min — ABNORMAL LOW (ref >60)
GLUCOSE: 136 mg/dL — AB (ref 65–99)
Potassium: 3 mmol/L — ABNORMAL LOW (ref 3.5–5.1)
SODIUM: 139 mmol/L (ref 135–145)
Total Bilirubin: 0.8 mg/dL (ref 0.3–1.2)
Total Protein: 8.1 g/dL (ref 6.5–8.1)

## 2016-02-07 LAB — URINALYSIS, COMPLETE (UACMP) WITH MICROSCOPIC
BILIRUBIN URINE: NEGATIVE
Glucose, UA: NEGATIVE mg/dL
HGB URINE DIPSTICK: NEGATIVE
KETONES UR: 5 mg/dL — AB
LEUKOCYTES UA: NEGATIVE
Nitrite: NEGATIVE
PH: 5 (ref 5.0–8.0)
Protein, ur: 100 mg/dL — AB
SPECIFIC GRAVITY, URINE: 1.026 (ref 1.005–1.030)

## 2016-02-07 LAB — PROTIME-INR
INR: 0.98
INR: 0.93
PROTHROMBIN TIME: 13 s (ref 11.4–15.2)
Prothrombin Time: 12.5 seconds (ref 11.4–15.2)

## 2016-02-07 LAB — MAGNESIUM: MAGNESIUM: 1.8 mg/dL (ref 1.7–2.4)

## 2016-02-07 LAB — APTT: APTT: 31 s (ref 24–36)

## 2016-02-07 SURGERY — HEMIARTHROPLASTY, HIP, DIRECT ANTERIOR APPROACH, FOR FRACTURE
Anesthesia: Spinal | Site: Hip | Laterality: Right | Wound class: Clean

## 2016-02-07 MED ORDER — FENTANYL CITRATE (PF) 100 MCG/2ML IJ SOLN: 25.0000 ug | INTRAMUSCULAR | Status: DC | PRN

## 2016-02-07 MED ORDER — POLYETHYLENE GLYCOL 3350 17 G PO PACK: 17.0000 g | PACK | Freq: Every day | ORAL | Status: DC | PRN

## 2016-02-07 MED ORDER — BUPIVACAINE HCL (PF) 0.5 % IJ SOLN
INTRAMUSCULAR | Status: AC
  Filled 2016-02-07: qty 10

## 2016-02-07 MED ORDER — ALUM & MAG HYDROXIDE-SIMETH 200-200-20 MG/5ML PO SUSP: 30.0000 mL | ORAL | Status: DC | PRN

## 2016-02-07 MED ORDER — MORPHINE SULFATE (PF) 2 MG/ML IV SOLN: 2.0000 mg | INTRAVENOUS | Status: DC | PRN

## 2016-02-07 MED ORDER — LACTATED RINGERS IV SOLN
INTRAVENOUS | Status: DC | PRN
  Administered 2016-02-07: 19:00:00 via INTRAVENOUS

## 2016-02-07 MED ORDER — MORPHINE SULFATE (PF) 2 MG/ML IV SOLN
INTRAVENOUS | Status: AC
  Administered 2016-02-07: 2 mg via INTRAVENOUS
  Filled 2016-02-07: qty 1

## 2016-02-07 MED ORDER — METOPROLOL TARTRATE 25 MG PO TABS
25.0000 mg | ORAL_TABLET | Freq: Two times a day (BID) | ORAL | Status: DC
  Administered 2016-02-07: 25 mg via ORAL
  Filled 2016-02-07: qty 1

## 2016-02-07 MED ORDER — ACETAMINOPHEN 325 MG PO TABS: 650.0000 mg | ORAL_TABLET | Freq: Four times a day (QID) | ORAL | Status: DC | PRN

## 2016-02-07 MED ORDER — PHENOL 1.4 % MT LIQD
1.0000 | OROMUCOSAL | Status: DC | PRN
  Filled 2016-02-07: qty 177

## 2016-02-07 MED ORDER — HYDROCODONE-ACETAMINOPHEN 5-325 MG PO TABS
1.0000 | ORAL_TABLET | ORAL | Status: DC | PRN
  Administered 2016-02-07: 2 via ORAL
  Administered 2016-02-07: 1 via ORAL
  Administered 2016-02-07: 2 via ORAL
  Administered 2016-02-08 (×2): 1 via ORAL
  Filled 2016-02-07: qty 1
  Filled 2016-02-07: qty 2
  Filled 2016-02-07 (×2): qty 1
  Filled 2016-02-07: qty 2

## 2016-02-07 MED ORDER — METHOCARBAMOL 1000 MG/10ML IJ SOLN
500.0000 mg | Freq: Four times a day (QID) | INTRAVENOUS | Status: DC | PRN
  Filled 2016-02-07: qty 5

## 2016-02-07 MED ORDER — ORAL CARE MOUTH RINSE
15.0000 mL | Freq: Two times a day (BID) | OROMUCOSAL | Status: DC
  Administered 2016-02-08 – 2016-02-09 (×3): 15 mL via OROMUCOSAL

## 2016-02-07 MED ORDER — PROMETHAZINE HCL 25 MG/ML IJ SOLN: 12.5000 mg | Freq: Once | INTRAMUSCULAR | Status: DC

## 2016-02-07 MED ORDER — ONDANSETRON HCL 4 MG/2ML IJ SOLN
4.0000 mg | Freq: Four times a day (QID) | INTRAMUSCULAR | Status: DC | PRN
  Administered 2016-02-07: 4 mg via INTRAVENOUS

## 2016-02-07 MED ORDER — ACETAMINOPHEN 650 MG RE SUPP: 650.0000 mg | Freq: Four times a day (QID) | RECTAL | Status: DC | PRN

## 2016-02-07 MED ORDER — MIDAZOLAM HCL 2 MG/2ML IJ SOLN
INTRAMUSCULAR | Status: AC
  Filled 2016-02-07: qty 2

## 2016-02-07 MED ORDER — MAGNESIUM CITRATE PO SOLN
1.0000 | Freq: Once | ORAL | Status: DC | PRN
  Filled 2016-02-07: qty 296

## 2016-02-07 MED ORDER — ONDANSETRON HCL 4 MG PO TABS: 4.0000 mg | ORAL_TABLET | Freq: Four times a day (QID) | ORAL | Status: DC | PRN

## 2016-02-07 MED ORDER — HYDRALAZINE HCL 20 MG/ML IJ SOLN
INTRAMUSCULAR | Status: AC
  Filled 2016-02-07: qty 1

## 2016-02-07 MED ORDER — MORPHINE SULFATE (PF) 2 MG/ML IV SOLN
2.0000 mg | Freq: Once | INTRAVENOUS | Status: AC
  Administered 2016-02-07: 2 mg via INTRAVENOUS

## 2016-02-07 MED ORDER — FENTANYL CITRATE (PF) 100 MCG/2ML IJ SOLN
INTRAMUSCULAR | Status: AC
  Filled 2016-02-07: qty 2

## 2016-02-07 MED ORDER — ONDANSETRON HCL 4 MG/2ML IJ SOLN: 4.0000 mg | Freq: Four times a day (QID) | INTRAMUSCULAR | Status: DC | PRN

## 2016-02-07 MED ORDER — ACETAMINOPHEN 325 MG PO TABS
650.0000 mg | ORAL_TABLET | Freq: Four times a day (QID) | ORAL | Status: DC | PRN
  Administered 2016-02-08 – 2016-02-10 (×3): 650 mg via ORAL
  Filled 2016-02-07 (×3): qty 2

## 2016-02-07 MED ORDER — ONDANSETRON HCL 4 MG PO TABS
4.0000 mg | ORAL_TABLET | Freq: Four times a day (QID) | ORAL | Status: DC | PRN
  Administered 2016-02-08: 4 mg via ORAL
  Filled 2016-02-07: qty 1

## 2016-02-07 MED ORDER — NEOMYCIN-POLYMYXIN B GU 40-200000 IR SOLN
Status: AC
  Filled 2016-02-07: qty 20

## 2016-02-07 MED ORDER — PROPOFOL 500 MG/50ML IV EMUL
INTRAVENOUS | Status: AC
Start: 2016-02-07 — End: 2016-02-07
  Filled 2016-02-07: qty 50

## 2016-02-07 MED ORDER — MORPHINE SULFATE (PF) 2 MG/ML IV SOLN
2.0000 mg | INTRAVENOUS | Status: DC | PRN
  Administered 2016-02-07: 2 mg via INTRAVENOUS
  Filled 2016-02-07: qty 1

## 2016-02-07 MED ORDER — PROPOFOL 500 MG/50ML IV EMUL
INTRAVENOUS | Status: DC | PRN
  Administered 2016-02-07: 15 ug/kg/min via INTRAVENOUS

## 2016-02-07 MED ORDER — MENTHOL 3 MG MT LOZG
1.0000 | LOZENGE | OROMUCOSAL | Status: DC | PRN
  Filled 2016-02-07: qty 9

## 2016-02-07 MED ORDER — SODIUM CHLORIDE 0.9 % IV SOLN
INTRAVENOUS | Status: DC
  Administered 2016-02-07: 06:00:00 via INTRAVENOUS

## 2016-02-07 MED ORDER — CEFAZOLIN SODIUM-DEXTROSE 2-4 GM/100ML-% IV SOLN
2.0000 g | Freq: Four times a day (QID) | INTRAVENOUS | Status: AC
  Administered 2016-02-07 – 2016-02-08 (×2): 2 g via INTRAVENOUS
  Filled 2016-02-07 (×3): qty 100

## 2016-02-07 MED ORDER — POTASSIUM CHLORIDE CRYS ER 20 MEQ PO TBCR
40.0000 meq | EXTENDED_RELEASE_TABLET | Freq: Once | ORAL | Status: AC
  Administered 2016-02-07: 40 meq via ORAL
  Filled 2016-02-07: qty 2

## 2016-02-07 MED ORDER — FERROUS SULFATE 325 (65 FE) MG PO TABS
325.0000 mg | ORAL_TABLET | Freq: Three times a day (TID) | ORAL | Status: DC
  Administered 2016-02-08 – 2016-02-09 (×6): 325 mg via ORAL
  Filled 2016-02-07 (×5): qty 1

## 2016-02-07 MED ORDER — HYDRALAZINE HCL 20 MG/ML IJ SOLN
10.0000 mg | Freq: Once | INTRAMUSCULAR | Status: AC
  Administered 2016-02-07: 10 mg via INTRAVENOUS

## 2016-02-07 MED ORDER — CEFAZOLIN SODIUM-DEXTROSE 2-4 GM/100ML-% IV SOLN
2.0000 g | INTRAVENOUS | Status: DC
  Filled 2016-02-07: qty 100

## 2016-02-07 MED ORDER — SENNA 8.6 MG PO TABS
1.0000 | ORAL_TABLET | Freq: Two times a day (BID) | ORAL | Status: DC
  Administered 2016-02-07 – 2016-02-09 (×5): 8.6 mg via ORAL
  Filled 2016-02-07 (×5): qty 1

## 2016-02-07 MED ORDER — CHLORHEXIDINE GLUCONATE 0.12 % MT SOLN
15.0000 mL | Freq: Two times a day (BID) | OROMUCOSAL | Status: DC
  Administered 2016-02-07 – 2016-02-10 (×6): 15 mL via OROMUCOSAL
  Filled 2016-02-07 (×4): qty 15

## 2016-02-07 MED ORDER — SUCCINYLCHOLINE CHLORIDE 200 MG/10ML IV SOSY
PREFILLED_SYRINGE | INTRAVENOUS | Status: AC
  Filled 2016-02-07: qty 10

## 2016-02-07 MED ORDER — ONDANSETRON HCL 4 MG/2ML IJ SOLN
INTRAMUSCULAR | Status: AC
  Administered 2016-02-07: 4 mg via INTRAVENOUS
  Filled 2016-02-07: qty 2

## 2016-02-07 MED ORDER — FENTANYL CITRATE (PF) 100 MCG/2ML IJ SOLN
50.0000 ug | Freq: Once | INTRAMUSCULAR | Status: AC
  Administered 2016-02-07: 50 ug via INTRAVENOUS
  Filled 2016-02-07: qty 2

## 2016-02-07 MED ORDER — GLYCOPYRROLATE 0.2 MG/ML IJ SOLN
INTRAMUSCULAR | Status: AC
  Filled 2016-02-07: qty 1

## 2016-02-07 MED ORDER — SODIUM CHLORIDE 0.9 % IV SOLN
75.0000 mL/h | INTRAVENOUS | Status: DC
  Administered 2016-02-07: 75 mL/h via INTRAVENOUS

## 2016-02-07 MED ORDER — PROMETHAZINE HCL 25 MG/ML IJ SOLN
12.5000 mg | Freq: Four times a day (QID) | INTRAMUSCULAR | Status: DC | PRN
  Administered 2016-02-07: 12.5 mg via INTRAVENOUS
  Filled 2016-02-07: qty 1

## 2016-02-07 MED ORDER — CEFAZOLIN SODIUM-DEXTROSE 2-3 GM-% IV SOLR
INTRAVENOUS | Status: DC | PRN
  Administered 2016-02-07: 2 g via INTRAVENOUS

## 2016-02-07 MED ORDER — LACTATED RINGERS IV SOLN
INTRAVENOUS | Status: DC | PRN
  Administered 2016-02-07: 18:00:00 via INTRAVENOUS

## 2016-02-07 MED ORDER — FENTANYL CITRATE (PF) 100 MCG/2ML IJ SOLN
INTRAMUSCULAR | Status: DC | PRN
  Administered 2016-02-07 (×3): 25 ug via INTRAVENOUS

## 2016-02-07 MED ORDER — ONDANSETRON HCL 4 MG/2ML IJ SOLN: 4.0000 mg | Freq: Once | INTRAMUSCULAR | Status: DC | PRN

## 2016-02-07 MED ORDER — NEOMYCIN-POLYMYXIN B GU 40-200000 IR SOLN
Status: DC | PRN
  Administered 2016-02-07: 4 mL

## 2016-02-07 MED ORDER — HYDRALAZINE HCL 20 MG/ML IJ SOLN
10.0000 mg | INTRAMUSCULAR | Status: DC | PRN
  Administered 2016-02-07 – 2016-02-09 (×2): 10 mg via INTRAVENOUS
  Filled 2016-02-07 (×2): qty 1

## 2016-02-07 MED ORDER — SENNOSIDES-DOCUSATE SODIUM 8.6-50 MG PO TABS: 1.0000 | ORAL_TABLET | Freq: Every evening | ORAL | Status: DC | PRN

## 2016-02-07 MED ORDER — DOCUSATE SODIUM 100 MG PO CAPS
100.0000 mg | ORAL_CAPSULE | Freq: Two times a day (BID) | ORAL | Status: DC
  Administered 2016-02-07 – 2016-02-10 (×6): 100 mg via ORAL
  Filled 2016-02-07 (×6): qty 1

## 2016-02-07 MED ORDER — METHOCARBAMOL 500 MG PO TABS
500.0000 mg | ORAL_TABLET | Freq: Four times a day (QID) | ORAL | Status: DC | PRN
  Administered 2016-02-08: 500 mg via ORAL
  Filled 2016-02-07: qty 1

## 2016-02-07 MED ORDER — SODIUM CHLORIDE 0.9 % IV SOLN: Freq: Once | INTRAVENOUS | Status: DC

## 2016-02-07 MED ORDER — ENOXAPARIN SODIUM 40 MG/0.4ML ~~LOC~~ SOLN
40.0000 mg | SUBCUTANEOUS | Status: DC
  Administered 2016-02-08 – 2016-02-09 (×2): 40 mg via SUBCUTANEOUS
  Filled 2016-02-07 (×2): qty 0.4

## 2016-02-07 MED ORDER — ONDANSETRON HCL 4 MG/2ML IJ SOLN
4.0000 mg | Freq: Once | INTRAMUSCULAR | Status: AC
  Administered 2016-02-07: 4 mg via INTRAVENOUS

## 2016-02-07 MED ORDER — BISACODYL 10 MG RE SUPP: 10.0000 mg | Freq: Every day | RECTAL | Status: DC | PRN

## 2016-02-07 SURGICAL SUPPLY — 55 items
BLADE SAGITTAL WIDE XTHICK NO (BLADE) ×3 IMPLANT
BLADE SURG SZ10 CARB STEEL (BLADE) ×3 IMPLANT
BNDG COHESIVE 4X5 TAN STRL (GAUZE/BANDAGES/DRESSINGS) ×6 IMPLANT
CANISTER SUCT 1200ML W/VALVE (MISCELLANEOUS) ×3 IMPLANT
CANISTER SUCT 3000ML PPV (MISCELLANEOUS) ×6 IMPLANT
CAPT HIP HEMI 2 ×3 IMPLANT
DRAPE IMP U-DRAPE 54X76 (DRAPES) ×3 IMPLANT
DRAPE INCISE IOBAN 66X60 STRL (DRAPES) ×3 IMPLANT
DRAPE SHEET LG 3/4 BI-LAMINATE (DRAPES) ×9 IMPLANT
DRAPE SURG 17X11 SM STRL (DRAPES) ×6 IMPLANT
DRAPE TABLE BACK 80X90 (DRAPES) ×3 IMPLANT
DRAPE U-SHAPE 47X51 STRL (DRAPES) ×3 IMPLANT
DRSG OPSITE POSTOP 4X10 (GAUZE/BANDAGES/DRESSINGS) ×3 IMPLANT
DURAPREP 26ML APPLICATOR (WOUND CARE) ×12 IMPLANT
ELECT BLADE 6.5 EXT (BLADE) ×3 IMPLANT
ELECT CAUTERY BLADE 6.4 (BLADE) ×3 IMPLANT
ELECT REM PT RETURN 9FT ADLT (ELECTROSURGICAL) ×3
ELECTRODE REM PT RTRN 9FT ADLT (ELECTROSURGICAL) ×1 IMPLANT
GAUZE PETRO XEROFOAM 1X8 (MISCELLANEOUS) ×6 IMPLANT
GAUZE SPONGE 4X4 12PLY STRL (GAUZE/BANDAGES/DRESSINGS) ×3 IMPLANT
GLOVE BIOGEL PI IND STRL 7.5 (GLOVE) ×3 IMPLANT
GLOVE BIOGEL PI IND STRL 9 (GLOVE) ×1 IMPLANT
GLOVE BIOGEL PI INDICATOR 7.5 (GLOVE) ×6
GLOVE BIOGEL PI INDICATOR 9 (GLOVE) ×2
GLOVE SURG 9.0 ORTHO LTXF (GLOVE) ×6 IMPLANT
GLOVE SURG SYN 7.0 (GLOVE) ×9 IMPLANT
GOWN STRL REUS TWL 2XL XL LVL4 (GOWN DISPOSABLE) ×3 IMPLANT
GOWN STRL REUS W/ TWL LRG LVL3 (GOWN DISPOSABLE) ×2 IMPLANT
GOWN STRL REUS W/TWL LRG LVL3 (GOWN DISPOSABLE) ×4
HANDPIECE INTERPULSE COAX TIP (DISPOSABLE) ×2
HEMOVAC 400ML (MISCELLANEOUS)
HIP CAPITATED HEMI 2 ×1 IMPLANT
KIT DRAIN HEMOVAC JP 7FR 400ML (MISCELLANEOUS) IMPLANT
KIT RM TURNOVER STRD PROC AR (KITS) ×3 IMPLANT
NDL SAFETY 18GX1.5 (NEEDLE) ×3 IMPLANT
NEEDLE FILTER BLUNT 18X 1/2SAF (NEEDLE) ×2
NEEDLE FILTER BLUNT 18X1 1/2 (NEEDLE) ×1 IMPLANT
NEEDLE MAYO CATGUT SZ4 (NEEDLE) ×3 IMPLANT
NS IRRIG 1000ML POUR BTL (IV SOLUTION) ×3 IMPLANT
PACK HIP PROSTHESIS (MISCELLANEOUS) ×3 IMPLANT
PILLOW ABDUC SM (MISCELLANEOUS) ×3 IMPLANT
RETRIEVER SUT HEWSON (MISCELLANEOUS) ×3 IMPLANT
SET HNDPC FAN SPRY TIP SCT (DISPOSABLE) ×1 IMPLANT
SLEEVE CABLE 2MM VT (Orthopedic Implant) ×3 IMPLANT
SOL .9 NS 3000ML IRR  AL (IV SOLUTION) ×2
SOL .9 NS 3000ML IRR UROMATIC (IV SOLUTION) ×1 IMPLANT
STAPLER SKIN PROX 35W (STAPLE) ×3 IMPLANT
SUT MNCRL 3 0 RB1 (SUTURE) ×1 IMPLANT
SUT MONOCRYL 3 0 RB1 (SUTURE) ×2
SUT TICRON 2-0 30IN 311381 (SUTURE) ×12 IMPLANT
SUT VIC AB 0 CT1 36 (SUTURE) ×6 IMPLANT
SUT VIC AB 2-0 CT2 27 (SUTURE) ×6 IMPLANT
SYRINGE 10CC LL (SYRINGE) ×6 IMPLANT
TAPE MICROFOAM 4IN (TAPE) IMPLANT
TAPE TRANSPORE STRL 2 31045 (GAUZE/BANDAGES/DRESSINGS) ×3 IMPLANT

## 2016-02-07 NOTE — NC FL2 (Signed)
[place] LEVEL OF CARE SCREENING TOOL     IDENTIFICATION  Patient Name: Melissa Watkins Birthdate: 1945-10-08 Sex: female Admission Date (Current Location): 02/07/2016  [place] and [place] Number:  Engineering geologist and Address:  [facility], [address], [place], [place] [address]      Provider Number: [id number]  Attending Physician Name and Address:  Fritzi Mandes, MD  Relative Name and Phone Number:       Current Level of Care: Hospital Recommended Level of Care: [place] Prior Approval Number:    Date Approved/Denied:   PASRR Number:  [id number])  Discharge Plan: SNF    Current Diagnoses: Patient Active Problem List   Diagnosis Date Noted  . Hip fracture ([place]) 02/07/2016  . CVA (cerebral vascular accident) ([place]) 07/17/2013  . Palpitations 07/17/2013    Orientation RESPIRATION BLADDER Height & Weight     Self, Time, Situation, Place  Normal Continent Weight: 111 lb 6.4 oz (50.5 kg) Height:  5' (152.4 cm)  BEHAVIORAL SYMPTOMS/MOOD NEUROLOGICAL BOWEL NUTRITION STATUS   (none)  (none) Continent Diet (NPO for surgery )  AMBULATORY STATUS COMMUNICATION OF NEEDS Skin   Extensive Assist Verbally Normal                       Personal Care Assistance Level of Assistance  Bathing, Feeding, Dressing Bathing Assistance: Limited assistance Feeding assistance: Independent Dressing Assistance: Limited assistance     Functional Limitations Info  Sight, Hearing, Speech Sight Info: Adequate Hearing Info: Adequate Speech Info: Adequate    SPECIAL CARE FACTORS FREQUENCY  PT (By licensed PT), OT (By licensed OT)     PT Frequency:  (5) OT Frequency:  (5)            Contractures      Additional Factors Info  Code Status, Allergies Code Status Info:  (Full Code. ) Allergies Info:  (Oxycodone-acetaminophen, Valium Diazepam)           Current Medications (02/07/2016):  This is the current  hospital active medication list Current Facility-Administered Medications  Medication Dose Route Frequency Provider Last Rate Last Dose  . 0.9 %  sodium chloride infusion   Intravenous Continuous Saundra [place], MD 75 mL/hr at 02/07/16 0610    . acetaminophen (TYLENOL) tablet 650 mg  650 mg Oral Q6H PRN Saundra Shell[place], MD       Or  . acetaminophen (TYLENOL) suppository 650 mg  650 mg Rectal Q6H PRN Saundra Shelling, MD      . chlorhexidine (PERIDEX) 0.12 % solution 15 mL  15 mL Mouth Rinse BID Pavan Pyreddy, MD      . hydrALAZINE (APRESOLINE) injection 10 mg  10 mg Intravenous Q4H PRN Saundra Shelling, MD   10 mg at 02/07/16 0446  . HYDROcodone-acetaminophen (NORCO/VICODIN) 5-325 MG per tablet 1-2 tablet  1-2 tablet Oral Q4H PRN Saundra Shelling, MD   2 tablet at 02/07/16 0756  . MEDLINE mouth rinse  15 mL Mouth Rinse q12n4p Pavan Pyreddy, MD      . metoprolol tartrate (LOPRESSOR) tablet 25 mg  25 mg Oral BID Fritzi Mandes, MD      . morphine 2 MG/ML injection 2 mg  2 mg Intravenous Q4H PRN Saundra [place], MD   2 mg at 02/07/16 0526  . ondansetron (ZOFRAN) tablet 4 mg  4 mg Oral Q6H PRN Saundra Shelling, MD       Or  . ondansetron (ZOFRAN) injection 4 mg  4 mg Intravenous Q6H PRN Saundra [place]ing, MD   4 mg at 02/07/16 0443  . senna-docusate (Senokot-S) tablet 1 tablet  1 tablet Oral QHS PRN Saundra [place]ing, MD         Discharge Medications: Please see discharge summary for a list of discharge medications.  Relevant Imaging Results:  Relevant Lab Results:   Additional Information  (SSN: [id number])  Maryana Pittmon, Veronia Beets, LCSW

## 2016-02-07 NOTE — Progress Notes (Addendum)
[place] [facility] [place] NAME: Melissa Watkins    MR#:  [id number]  DATE OF BIRTH:  April 04, 1945  SUBJECTIVE:  Came in after fall, mechanical  REVIEW OF SYSTEMS:   Review of Systems  Constitutional: Negative for chills, fever and weight loss.  HENT: Negative for ear discharge, ear pain and nosebleeds.   Eyes: Negative for blurred vision, pain and discharge.  Respiratory: Negative for sputum production, shortness of breath, wheezing and stridor.   Cardiovascular: Negative for chest pain, palpitations, orthopnea and PND.  Gastrointestinal: Negative for abdominal pain, diarrhea, nausea and vomiting.  Genitourinary: Negative for frequency and urgency.  Musculoskeletal: Positive for falls and joint pain. Negative for back pain.  Neurological: Negative for sensory change, speech change, focal weakness and weakness.  Psychiatric/Behavioral: Negative for depression and hallucinations. The patient is not nervous/anxious.    Tolerating Diet: Tolerating PT:   DRUG ALLERGIES:   Allergies  Allergen Reactions  . Oxycodone-Acetaminophen Other (See Comments)    Hallucinations  . Valium [Diazepam]     VITALS:  Blood pressure (!) 150/70, pulse (!) 105, temperature 98.4 F (36.9 C), temperature source Oral, resp. rate 20, height 5' (1.524 m), weight 50.5 kg (111 lb 6.4 oz), SpO2 94 %.  PHYSICAL EXAMINATION:   Physical Exam  GENERAL:  71 y.o.-year-old patient lying in the bed with no acute distress.  EYES: Pupils equal, round, reactive to light and accommodation. No scleral icterus. Extraocular muscles intact.  HEENT: Head atraumatic, normocephalic. Oropharynx and nasopharynx clear.  NECK:  Supple, no jugular venous distention. No thyroid enlargement, no tenderness.  LUNGS: Normal breath sounds bilaterally, no wheezing, rales, rhonchi. No use of accessory muscles of respiration.  CARDIOVASCULAR: S1, S2 normal. No murmurs, rubs, or gallops.   ABDOMEN: Soft, nontender, nondistended. Bowel sounds present. No organomegaly or mass.  EXTREMITIES: No cyanosis, clubbing or edema b/l.    NEUROLOGIC: Cranial nerves II through XII are intact. No focal Motor or sensory deficits b/l.   PSYCHIATRIC:  patient is alert and oriented x 3.  SKIN: No obvious rash, lesion, or ulcer.   LABORATORY PANEL:  CBC  Recent Labs Lab 02/07/16 0617  WBC 15.2*  HGB 13.3  HCT 40.0  PLT 227    Chemistries   Recent Labs Lab 02/07/16 0143 02/07/16 0617  NA 139 139  K 3.0* 3.6  CL 106 105  CO2 24 22  GLUCOSE 136* 126*  BUN 28* 31*  CREATININE 1.21* 1.16*  CALCIUM 9.0 9.0  AST 36  --   ALT 20  --   ALKPHOS 80  --   BILITOT 0.8  --    Cardiac Enzymes No results for input(s): TROPONINI in the last 168 hours. RADIOLOGY:  Dg Chest 1 View  Result Date: 02/07/2016 CLINICAL DATA:  Fall.  Right hip fracture. EXAM: CHEST 1 VIEW COMPARISON:  None. FINDINGS: Shallow inspiration. Normal heart size and pulmonary vascularity. No focal airspace disease or consolidation in the lungs. No blunting of costophrenic angles. No pneumothorax. Mediastinal contours appear intact. Tortuous aorta. IMPRESSION: No active disease. Electronically Signed   By: Lucienne Capers M.D.   On: 02/07/2016 02:38   Dg Hip Unilat With Pelvis 2-3 Views Right  Result Date: 02/07/2016 CLINICAL DATA:  Patient fell 1 day ago after losing balance. EXAM: DG HIP (WITH OR WITHOUT PELVIS) 2-3V RIGHT COMPARISON:  None. FINDINGS: There is an acute transverse fracture of the subcapital right proximal femur with varus angulation of the fracture fragments.  No dislocation at the hip joint. Possible nondisplaced acute fracture of the right superior pubic ramus. Probable old fracture deformities of the left superior and inferior pubic rami. Diffuse bone demineralization. No destructive bone lesions. Soft tissues are unremarkable. IMPRESSION: Acute transverse fracture of the subcapital right proximal femur  with varus angulation of the fracture fragments. Electronically Signed   By: Lucienne Capers M.D.   On: 02/07/2016 02:37   ASSESSMENT AND PLAN:   Melissa Watkins  is a 71 y.o. female with a known history of chronic back pain presented to the emergency room after she had a fall yesterday. Patient lost balance and fell on her right hip yesterday at home in the evening. Since this morning she had a lot of pain in the right hip area and she was very uncomfortable  1. Right proximal femur fracture s/p mechanical fall at home -pt denies any Cp or sob. -she has no cardiac history. -echo 2015 showed EF 55-60% -EKg NSR and LVH -Pt is at low to intermediate risk for surgery -spoke with DR K -d/w pt and son william  2. HTN -not on any meds ( used to be on it ) -start metoprolol in the perioperative period  3. Hypokalemia -repleted  4. Leukocytosis appears reactive  5. Dehydration -cont IVF  6. DVT prophylaxis After surgery Till then SCD and TEDS  Case discussed with Care Management/Social Worker. Management plans discussed with the patient, family and they are in agreement.  CODE STATUS: full  DVT Prophylaxis: as above  TOTAL TIME TAKING CARE OF THIS PATIENT: 30 minutes.  >50% time spent on counselling and coordination of care  POSSIBLE D/C IN 2-3 DAYS, DEPENDING ON CLINICAL CONDITION.  Note: This dictation was prepared with Dragon dictation along with smaller phrase technology. Any transcriptional errors that result from this process are unintentional.  Shon Indelicato M.D on 02/07/2016 at 9:28 AM  Between 7am to 6pm - Pager - [phone]  After 6pm go to [email or web address] - password [email or web address] [facility]  [facility]  Office  [phone]  CC: Primary care physician; No PCP Per Patient

## 2016-02-07 NOTE — ED Provider Notes (Signed)
[facility] Emergency Department Provider Note  ____________________________________________  Time seen: Approximately 1:43 AM  I have reviewed the triage vital signs and the nursing notes.   HISTORY  Chief Complaint Fall   HPI Melissa Watkins is a 71 y.o. female history of CVA who walks with a walker who presents for evaluation of right hip pain. Patient sustained a fall yesterday. Has not been able to walk or bear weight. Today and the last was call for worsening pain. Patient arrives with shortened and externally rotated right leg. She reports that she let go of her walker and lost her balance and fell. She has a history of multiple falls.Patient denies head trauma or LOC, she does not take any blood thinners. She denies headache, palpitations, dizziness, chest pain, shortness of breath, abdominal pain, back pain both preceding or after the fall. She currently endorses severe pain located in her right hip that has been constant since the fall nonradiating and worse with movement.  Past Medical History:  Diagnosis Date  . Back pain, chronic     Patient Active Problem List   Diagnosis Date Noted  . Hip fracture ([place]) 02/07/2016  . CVA (cerebral vascular accident) ([place]) 07/17/2013  . Palpitations 07/17/2013    Past Surgical History:  Procedure Laterality Date  . CHOLECYSTECTOMY    . COLONOSCOPY    . OOPHORECTOMY      Prior to Admission medications   Medication Sig Start Date End Date Taking? Authorizing Provider  acetaminophen (TYLENOL) 500 MG tablet Take 500 mg by mouth as needed.    Historical Provider, MD  aspirin 81 MG tablet Take 81 mg by mouth daily.    Historical Provider, MD  cyclobenzaprine (FLEXERIL) 5 MG tablet Take 1 tablet (5 mg total) by mouth 3 (three) times daily as needed for muscle spasms. 07/15/13   Wellington Hampshire, MD    Allergies Valium [diazepam]  Family History  Problem Relation Age of Onset  . Asthma Son     Social  History Social History  Substance Use Topics  . Smoking status: Current Every Day Smoker    Packs/day: 1.00    Years: 50.00    Types: Cigarettes  . Smokeless tobacco: Not on file  . Alcohol use No     Comment: occasional    Review of Systems  Constitutional: Negative for fever. Eyes: Negative for visual changes. ENT: Negative for sore throat. Neck: No neck pain  Cardiovascular: Negative for chest pain. Respiratory: Negative for shortness of breath. Gastrointestinal: Negative for abdominal pain, vomiting or diarrhea. Genitourinary: Negative for dysuria. Musculoskeletal: Negative for back pain. + R hip pain Skin: Negative for rash. Neurological: Negative for headaches, weakness or numbness. Psych: No SI or HI  ____________________________________________   PHYSICAL EXAM:  VITAL SIGNS: ED Triage Vitals  Enc Vitals Group     BP 02/07/16 0134 (!) 156/110     Pulse Rate 02/07/16 0134 97     Resp 02/07/16 0134 20     Temp 02/07/16 0134 98.3 F (36.8 C)     Temp Source 02/07/16 0134 Oral     SpO2 02/07/16 0134 97 %     Weight 02/07/16 0135 114 lb (51.7 kg)     Height 02/07/16 0135 5' (1.524 m)     Head Circumference --      Peak Flow --      Pain Score 02/07/16 0136 9     Pain Loc --      Pain Edu? --  Excl. in [place]? --    Constitutional: Alert and oriented. No acute distress. Does not appear intoxicated. HEENT Head: Normocephalic and atraumatic. Face: No facial bony tenderness. Stable midface Ears: No hemotympanum bilaterally. No Battle sign Eyes: No eye injury. PERRL. No raccoon eyes Nose: Nontender. No epistaxis. No rhinorrhea Mouth/Throat: Mucous membranes are moist. No oropharyngeal blood. No dental injury. Airway patent without stridor. Normal voice. Neck: no C-collar in place. No midline c-spine tenderness.  Cardiovascular: Normal rate, regular rhythm. Normal and symmetric distal pulses are present in all extremities. Pulmonary/Chest: Chest wall is stable  and nontender to palpation/compression. Normal respiratory effort. Breath sounds are normal. No crepitus.  Abdominal: Soft, nontender, non distended. Musculoskeletal: RLE is shortened and externally rotated with significant ttp over the R hip and very limited ROM of the R hip. Nontender with normal full range of motion in all other extremities. No deformities. No thoracic or lumbar midline spinal tenderness. Pelvis is stable. Skin: Skin is warm, dry and intact. No abrasions or contutions. Psychiatric: Speech and behavior are appropriate. Neurological: Normal speech and language. Moves all extremities to command. No gross focal neurologic deficits are appreciated.  Glascow Coma Score: 4 - Opens eyes on own 6 - Follows simple motor commands 5 - Alert and oriented GCS: 15   ____________________________________________   LABS (all labs ordered are listed, but only abnormal results are displayed)  Labs Reviewed  COMPREHENSIVE METABOLIC PANEL - Abnormal; Notable for the following:       Result Value   Potassium 3.0 (*)    Glucose, Bld 136 (*)    BUN 28 (*)    Creatinine, Ser 1.21 (*)    GFR calc non Af Amer 45 (*)    GFR calc Af Amer 52 (*)    All other components within normal limits  CBC WITH DIFFERENTIAL/PLATELET - Abnormal; Notable for the following:    WBC 14.3 (*)    Neutro Abs 12.7 (*)    Lymphs Abs 0.7 (*)    Basophils Absolute 0.2 (*)    All other components within normal limits  URINALYSIS, COMPLETE (UACMP) WITH MICROSCOPIC - Abnormal; Notable for the following:    Color, Urine YELLOW (*)    APPearance HAZY (*)    Ketones, ur 5 (*)    Protein, ur 100 (*)    Bacteria, UA RARE (*)    Squamous Epithelial / LPF 0-5 (*)    All other components within normal limits  URINE CULTURE  PROTIME-INR  TYPE AND SCREEN  TYPE AND SCREEN   ____________________________________________  EKG  ED ECG REPORT I, Rudene Re, the attending physician, personally viewed and  interpreted this ECG.  Normal sinus rhythm with occasional PVCs, rate of 97, normal intervals, normal axis, no ST elevations or depressions.  ____________________________________________  RADIOLOGY  XR hip: Acute transverse fracture of the subcapital right proximal femur with varus angulation of the fracture fragments.  CXR: No active disease. ____________________________________________   PROCEDURES  Procedure(s) performed: None Procedures Critical Care performed:  None ____________________________________________   INITIAL IMPRESSION / ASSESSMENT AND PLAN / ED COURSE   71 y.o. female history of CVA who walks with a walker who presents for evaluation of right hip pain s/p mechanical fall. Patient has shortened and externally rotated right lower extremity with significant tenderness to palpation on her right hip and limited range of motion concerning for hip fracture. We'll give IV for no, get an x-ray. No other injuries based on history and physical exam.  Clinical Course  X-ray concerning for an acute subcapital right proximal femur fracture. Patient will be admitted to the Hospitalist service for further management.  Pertinent labs & imaging results that were available during my care of the patient were reviewed by me and considered in my medical decision making (see chart for details).    ____________________________________________   FINAL CLINICAL IMPRESSION(S) / ED DIAGNOSES  Final diagnoses:  Fall  Closed fracture of right hip, initial encounter ([place])      NEW MEDICATIONS STARTED DURING THIS VISIT:  New Prescriptions   No medications on file     Note:  This document was prepared using Dragon voice recognition software and may include unintentional dictation errors.    Rudene Re, MD 02/07/16 [phone]

## 2016-02-07 NOTE — Transfer of Care (Signed)
Immediate Anesthesia Transfer of Care Note  Patient: Melissa Watkins  Procedure(s) Performed: Procedure(s): ARTHROPLASTY BIPOLAR HIP (HEMIARTHROPLASTY) (Right)  Patient Location: PACU  Anesthesia Type:General  Level of Consciousness: awake and confused  Airway & Oxygen Therapy: Patient Spontanous Breathing and Patient connected to face mask oxygen  Post-op Assessment: Report given to RN and Post -op Vital signs reviewed and stable  Post vital signs: Reviewed and stable  Last Vitals:  Vitals:   02/07/16 0544 02/07/16 0751  BP: (!) 156/69 (!) 150/70  Pulse: (!) 119 (!) 105  Resp: 20 20  Temp: 36.8 C 36.9 C    Last Pain:  Vitals:   02/07/16 1658  TempSrc:   PainSc: 5          Complications: No apparent anesthesia complications

## 2016-02-07 NOTE — Clinical Social Work Note (Signed)
Clinical Social Work Assessment  Patient Details  Name: Melissa Watkins MRN: [id number] Date of Birth: 06-04-1945  Date of referral:  02/07/16               Reason for consult:  Facility Placement                Permission sought to share information with:  Chartered certified accountant granted to share information::  Yes, Verbal Permission Granted  Name::      [place]::   [place]   Relationship::     Contact Information:     Housing/Transportation Living arrangements for the past 2 months:  [place] of Information:  Patient, Adult Children Patient Interpreter Needed:  None Criminal Activity/Legal Involvement Pertinent to Current Situation/Hospitalization:  No - Comment as needed Significant Relationships:  Adult Children Lives with:  Adult Children Do you feel safe going back to the place where you live?  Yes Need for family participation in patient care:  Yes (Comment)  Care giving concerns:  Patient lives in [place] with her son Melissa Watkins [phone].    Social Worker assessment / plan:  Holiday representative (CSW) received SNF consult. Patient is having surgery today for a hip fracture. CSW met with patient and her son Melissa Watkins was at bedside prior to surgery today. CSW introduced self and explained role of CSW department. Per son patient lives with him in [place]. Son reported that his daughter also lives with them part time when she is not at her mother's house. CSW explained that PT will evaluate patient after surgery and recommend SNF or home health. Son and patient prefer SNF. CSW explained that Medicare will pay for SNF if patient has a 3 night inpatient qualifying stay at [facility]. Patient was admitted to inpatient today on 02/07/16. Patient and son are agreeable to SNF search in [place]. FL2 complete and faxed out. CSW will continue to follow and assist as needed.   Employment status:   Retired Forensic scientist:  Medicare PT Recommendations:  Not assessed at this time Information / Referral to community resources:  [place]  Patient/Family's Response to care:  Patient and son prefer SNF.   Patient/Family's Understanding of and Emotional Response to Diagnosis, Current Treatment, and Prognosis:  Patient and son were pleasant and thanked CSW for visit.   Emotional Assessment Appearance:    Attitude/Demeanor/Rapport:    Affect (typically observed):  Calm Orientation:  Oriented to Self, Oriented to Place, Oriented to  Time, Fluctuating Orientation (Suspected and/or reported Sundowners) Alcohol / Substance use:  Not Applicable Psych involvement (Current and /or in the community):  No (Comment)  Discharge Needs  Concerns to be addressed:  Discharge Planning Concerns Readmission within the last 30 days:  No Current discharge risk:  Dependent with Mobility Barriers to Discharge:  Continued Medical Work up   [employer], Veronia Beets, LCSW 02/07/2016, 7:13 PM

## 2016-02-07 NOTE — Care Management Important Message (Signed)
Important Message  Patient Details  Name: Melissa Watkins MRN: [id number] Date of Birth: 01/21/1946   Medicare Important Message Given:  Yes    Jolly Mango, RN 02/07/2016, 10:42 AM

## 2016-02-07 NOTE — ED Notes (Signed)
Called floor for pt coming and med update

## 2016-02-07 NOTE — Consult Note (Signed)
ORTHOPAEDIC CONSULTATION  REQUESTING PHYSICIAN: Fritzi Mandes, MD  Chief Complaint: Right hip pain status post fall  HPI: Melissa Watkins is a 71 y.o. female who complains of  right hip pain after a fall at home. Patient was brought in through the emergency department and x-rays revealed a displaced right femoral neck hip fracture. She was admitted to the hospitalist service for preoperative evaluation. Orthopedic surgery was consulted for management of the patient's hip fracture. Patient is seen in her hospital room. She was sleeping comfortably when I entered the room. Patient denies any significant right hip pain at the moment.  Past Medical History:  Diagnosis Date  . Back pain, chronic    Past Surgical History:  Procedure Laterality Date  . CHOLECYSTECTOMY    . COLONOSCOPY    . OOPHORECTOMY     Social History   Social History  . Marital status: Widowed    Spouse name: N/A  . Number of children: N/A  . Years of education: N/A   Occupational History  . retired    Social History Main Topics  . Smoking status: Current Every Day Smoker    Packs/day: 1.00    Years: 50.00    Types: Cigarettes  . Smokeless tobacco: Never Used  . Alcohol use No     Comment: occasional  . Drug use: No  . Sexual activity: Not Asked   Other Topics Concern  . None   Social History Narrative  . None   Family History  Problem Relation Age of Onset  . Asthma Son    Allergies  Allergen Reactions  . Oxycodone-Acetaminophen Other (See Comments)    Hallucinations  . Valium [Diazepam]    Prior to Admission medications   Medication Sig Start Date End Date Taking? Authorizing Provider  acetaminophen (TYLENOL) 500 MG tablet Take 500 mg by mouth as needed.   Yes Historical Provider, MD  aspirin 81 MG tablet Take 81 mg by mouth daily.   Yes Historical Provider, MD   Dg Chest 1 View  Result Date: 02/07/2016 CLINICAL DATA:  Fall.  Right hip fracture. EXAM: CHEST 1 VIEW COMPARISON:  None.  FINDINGS: Shallow inspiration. Normal heart size and pulmonary vascularity. No focal airspace disease or consolidation in the lungs. No blunting of costophrenic angles. No pneumothorax. Mediastinal contours appear intact. Tortuous aorta. IMPRESSION: No active disease. Electronically Signed   By: Lucienne Capers M.D.   On: 02/07/2016 02:38   Dg Hip Unilat With Pelvis 2-3 Views Right  Result Date: 02/07/2016 CLINICAL DATA:  Patient fell 1 day ago after losing balance. EXAM: DG HIP (WITH OR WITHOUT PELVIS) 2-3V RIGHT COMPARISON:  None. FINDINGS: There is an acute transverse fracture of the subcapital right proximal femur with varus angulation of the fracture fragments. No dislocation at the hip joint. Possible nondisplaced acute fracture of the right superior pubic ramus. Probable old fracture deformities of the left superior and inferior pubic rami. Diffuse bone demineralization. No destructive bone lesions. Soft tissues are unremarkable. IMPRESSION: Acute transverse fracture of the subcapital right proximal femur with varus angulation of the fracture fragments. Electronically Signed   By: Lucienne Capers M.D.   On: 02/07/2016 02:37    Positive ROS: All other systems have been reviewed and were otherwise negative with the exception of those mentioned in the HPI and as above.  Physical Exam: General: Alert, no acute distress  MUSCULOSKELETAL: Right lower extremity: Skin overlying the right hip is intact. There is no erythema or ecchymosis or swelling. Patient's thigh  compartment is soft and compressible. She has intact sensation light touch in palpable pedal pulses. She has intact motor function and can flex and extend her toes and dorsiflex and plantarflex her ankle. Patient has shortening and external rotation to the right lower extremity.  Assessment: Right femoral neck hip fracture, displaced  Plan: I splinted the patient the nature of her fracture. I explained to her that the treatment for this  is a hemiarthroplasty.  I also spoke with her son [name] by phone. I reviewed the details of the operation with him as well as the postoperative course. I also explained potential complications to surgery. He understands the risks include but are not limited to: Infection requiring removal of prosthesis, bleeding requiring blood transfusion, nerve or blood vessel injury especially injury to the sciatic nerve leading to numbness or foot drop, leg length discrepancy, change in lower extremity rotation, fracture, dislocation, hardware failure, persistent right hip pain and the need for further surgery, including conversion to a total hip arthroplasty. He also understands the medical risks include but are not limited to DVT and pulmonary embolism, myocardial infarction, stroke, pneumonia, respiratory failure and death. Patient son understood these risks and agreed with the plan for surgery.  She has been nothing by mouth since admission at 5 AM. She has been cleared by the hospitalist for surgery. I personally spoken with Dr. Fritzi Mandes regarding clearance. I reviewed all laboratory and radiographic studies in preparation for this case.  I marked the surgical site with my initials and the word yes according to the hospital's correct site of surgery protocol. Surgery is scheduled for later today.    Thornton Park, MD    02/07/2016 1:00 PM

## 2016-02-07 NOTE — H&P (Signed)
[facility] NAME: Melissa Watkins    MR#:  [id number]  DATE OF BIRTH:  [birth year]  DATE OF ADMISSION:  02/07/2016  PRIMARY CARE PHYSICIAN: No PCP Per Patient   REQUESTING/REFERRING PHYSICIAN:   CHIEF COMPLAINT:   Chief Complaint  Patient presents with  . Fall    HISTORY OF PRESENT ILLNESS: Melissa Watkins  is a 72 y.o. female with a known history of chronic back pain presented to the emergency room after she had a fall yesterday. Patient lost balance and fell on her right hip yesterday at home in the evening. Since this morning she had a lot of pain in the right hip area and she was very uncomfortable. The pain in the right hip is aching in nature 9 out of 10 on a scale of 1-10. No history of any loss of consciousness. No headaches, dizziness and blurry vision. Patient was evaluated with x-ray of the hip which showed a right femur fracture in the proximal aspect. Patient during the workup in the emergency room had elevated WBC count and low potassium level. Potassium was supplemented. Hospitalist service was consulted for further care of the patient. No complaints of any chest pain, shortness of breath and palpitations.  PAST MEDICAL HISTORY:   Past Medical History:  Diagnosis Date  . Back pain, chronic     PAST SURGICAL HISTORY: Past Surgical History:  Procedure Laterality Date  . CHOLECYSTECTOMY    . COLONOSCOPY    . OOPHORECTOMY      SOCIAL HISTORY:  Social History  Substance Use Topics  . Smoking status: Current Every Day Smoker    Packs/day: 1.00    Years: 50.00    Types: Cigarettes  . Smokeless tobacco: Not on file  . Alcohol use No     Comment: occasional    FAMILY HISTORY:  Family History  Problem Relation Age of Onset  . Asthma Son     DRUG ALLERGIES:  Allergies  Allergen Reactions  . Valium [Diazepam]     REVIEW OF SYSTEMS:   CONSTITUTIONAL: No fever, fatigue or weakness.  EYES: No blurred  or double vision.  EARS, NOSE, AND THROAT: No tinnitus or ear pain.  RESPIRATORY: No cough, shortness of breath, wheezing or hemoptysis.  CARDIOVASCULAR: No chest pain, orthopnea, edema.  GASTROINTESTINAL: No nausea, vomiting, diarrhea or abdominal pain.  GENITOURINARY: No dysuria, hematuria.  ENDOCRINE: No polyuria, nocturia,  HEMATOLOGY: No anemia, easy bruising or bleeding SKIN: No rash or lesion. MUSCULOSKELETAL: hip pain on right side  NEUROLOGIC: No tingling, numbness, weakness.  PSYCHIATRY: No anxiety or depression.   MEDICATIONS AT HOME:  Prior to Admission medications   Medication Sig Start Date End Date Taking? Authorizing Provider  acetaminophen (TYLENOL) 500 MG tablet Take 500 mg by mouth as needed.    Historical Provider, MD  aspirin 81 MG tablet Take 81 mg by mouth daily.    Historical Provider, MD  cyclobenzaprine (FLEXERIL) 5 MG tablet Take 1 tablet (5 mg total) by mouth 3 (three) times daily as needed for muscle spasms. 07/15/13   Wellington Hampshire, MD      PHYSICAL EXAMINATION:   VITAL SIGNS: Blood pressure (!) 156/110, pulse 97, temperature 98.3 F (36.8 C), temperature source Oral, resp. rate 20, height 5' (1.524 m), weight 51.7 kg (114 lb), SpO2 97 %.  GENERAL:  71 y.o.-year-old patient lying in the bed with no acute distress.  EYES: Pupils equal, round, reactive to light and  accommodation. No scleral icterus. Extraocular muscles intact.  HEENT: Head atraumatic, normocephalic. Oropharynx and nasopharynx clear.  NECK:  Supple, no jugular venous distention. No thyroid enlargement, no tenderness.  LUNGS: Normal breath sounds bilaterally, no wheezing, rales,rhonchi or crepitation. No use of accessory muscles of respiration.  CARDIOVASCULAR: S1, S2 normal. No murmurs, rubs, or gallops.  ABDOMEN: Soft, nontender, nondistended. Bowel sounds present. No organomegaly or mass.  EXTREMITIES: No pedal edema, cyanosis, or clubbing.  Tenderness right hip noted. NEUROLOGIC:  Cranial nerves II through XII are intact. Muscle strength 5/5 in all extremities. Sensation intact. Gait not checked.  PSYCHIATRIC: The patient is alert and oriented x 3.  SKIN: No obvious rash, lesion, or ulcer.   LABORATORY PANEL:   CBC  Recent Labs Lab 02/07/16 0143  WBC 14.3*  HGB 13.9  HCT 41.7  PLT 209  MCV 90.5  MCH 30.1  MCHC 33.3  RDW 14.1  LYMPHSABS 0.7*  MONOABS 0.8  EOSABS 0.0  BASOSABS 0.2*   ------------------------------------------------------------------------------------------------------------------  Chemistries   Recent Labs Lab 02/07/16 0143  NA 139  K 3.0*  CL 106  CO2 24  GLUCOSE 136*  BUN 28*  CREATININE 1.21*  CALCIUM 9.0  AST 36  ALT 20  ALKPHOS 80  BILITOT 0.8   ------------------------------------------------------------------------------------------------------------------ estimated creatinine clearance is 31.5 mL/min (by C-G formula based on SCr of 1.21 mg/dL (H)). ------------------------------------------------------------------------------------------------------------------ No results for input(s): TSH, T4TOTAL, T3FREE, THYROIDAB in the last 72 hours.  Invalid input(s): FREET3   Coagulation profile  Recent Labs Lab 02/07/16 0143  INR 0.98   ------------------------------------------------------------------------------------------------------------------- No results for input(s): DDIMER in the last 72 hours. -------------------------------------------------------------------------------------------------------------------  Cardiac Enzymes No results for input(s): CKMB, TROPONINI, MYOGLOBIN in the last 168 hours.  Invalid input(s): CK ------------------------------------------------------------------------------------------------------------------ Invalid input(s): POCBNP  ---------------------------------------------------------------------------------------------------------------  Urinalysis No results found  for: COLORURINE, APPEARANCEUR, LABSPEC, PHURINE, GLUCOSEU, HGBUR, BILIRUBINUR, KETONESUR, PROTEINUR, UROBILINOGEN, NITRITE, LEUKOCYTESUR   RADIOLOGY: Dg Chest 1 View  Result Date: 02/07/2016 CLINICAL DATA:  Fall.  Right hip fracture. EXAM: CHEST 1 VIEW COMPARISON:  None. FINDINGS: Shallow inspiration. Normal heart size and pulmonary vascularity. No focal airspace disease or consolidation in the lungs. No blunting of costophrenic angles. No pneumothorax. Mediastinal contours appear intact. Tortuous aorta. IMPRESSION: No active disease. Electronically Signed   By: Lucienne Capers M.D.   On: 02/07/2016 02:38   Dg Hip Unilat With Pelvis 2-3 Views Right  Result Date: 02/07/2016 CLINICAL DATA:  Patient fell 1 day ago after losing balance. EXAM: DG HIP (WITH OR WITHOUT PELVIS) 2-3V RIGHT COMPARISON:  None. FINDINGS: There is an acute transverse fracture of the subcapital right proximal femur with varus angulation of the fracture fragments. No dislocation at the hip joint. Possible nondisplaced acute fracture of the right superior pubic ramus. Probable old fracture deformities of the left superior and inferior pubic rami. Diffuse bone demineralization. No destructive bone lesions. Soft tissues are unremarkable. IMPRESSION: Acute transverse fracture of the subcapital right proximal femur with varus angulation of the fracture fragments. Electronically Signed   By: Lucienne Capers M.D.   On: 02/07/2016 02:37    EKG: Orders placed or performed during the hospital encounter of 02/07/16  . ED EKG  . ED EKG    IMPRESSION AND PLAN: 71 year old female patient presented to the emergency room for fall. Admitting diagnosis 1. Right proximal femur fracture 2. Accidental fall 3. Hypokalemia 4. Leukocytosis 5. Dehydration 6. Elevated blood pressure secondary to pain Treatment plan Admit patient to medical floor IV fluid hydration Pain management  Keep patient nothing by mouth Orthopedic surgery consultation  for repair of femur fracture Supplement potassium Check urinalysis to rule out infection Follow-up WBC count.  All the records are reviewed and case discussed with ED provider. Management plans discussed with the patient, family and they are in agreement.  CODE STATUS:FULL CODE Surrogate decision maker : Son Code Status History    This patient does not have a recorded code status. Please follow your organizational policy for patients in this situation.       TOTAL TIME TAKING CARE OF THIS PATIENT: 50 minutes.    Saundra Shelling M.D on 02/07/2016 at 3:19 AM  Between 7am to 6pm - Pager - [phone]  After 6pm go to [email or web address] - password [email or web address] [facility]  [facility]  Office  [phone]  CC: Primary care physician; No PCP Per Patient

## 2016-02-07 NOTE — ED Notes (Signed)
Patient transfer to 142

## 2016-02-07 NOTE — Op Note (Signed)
02/07/2016  8:59 PM  PATIENT:  Melissa Watkins   MRN: [id number]  PRE-OPERATIVE DIAGNOSIS:  Right Femoral Neck Hip Fracture   POST-OPERATIVE DIAGNOSIS:  Right- Femoral Neck Hip Fracture   PROCEDURE:  Procedure(s): RIGHT HIP HEMIARTHROPLASTY   PREOPERATIVE INDICATIONS:    Melissa Watkins is an 71 y.o. female who was admitted with a diagnosis of Right Femoral Neck Hip Fracture after a fall at home .  I have recommended surgical fixation with hemiarthroplasty for this injury. I have explained the surgery and the postoperative course to the patient and then her son by phone who have agreed with surgical management of this fracture.    The risks benefits and alternatives were discussed with the patient and their family including but not limited to the risks of  infection requiring removal of the prosthesis, bleeding requiring blood transfusion, nerve injury especially to the sciatic nerve leading to foot drop or lower extremity numbness, periprosthetic fracture, dislocation leg length discrepancy, change in lower extremity rotation persistent hip pain, loosening or failure of the components and the need for revision surgery. Medical risks include but are not limited to DVT and pulmonary embolism, myocardial infarction, stroke, pneumonia, respiratory failure and death.  OPERATIVE REPORT     SURGEON:  Thornton Park, MD    ASSISTANT:  Surgical tech    ANESTHESIA:  spinal    COMPLICATIONS:  None.   SPECIMEN: Femoral head to pathology    COMPONENTS:  Stryker Accolade TMZF femoral component size 4.5 with a 45 mm +0 neck adjustment sleeve.    PROCEDURE IN DETAIL:   The patient was met in the holding area and  identified.  The appropriate hip was identified and marked at the operative site after verbally confirming with the patient that this was the correct site of surgery.  The patient was then transported to the OR  and  underwent spinal anesthesia.  The patient was then placed in the  lateral decubitus position with the operative side up and secured on the operating room table with a pegboard and all bony prominences were adequately padded. This included an axillary roll and additional padding around the nonoperative leg to prevent compression to the common peroneal nerve.    The operative lower extremity was prepped and draped in a sterile fashion.  A time out was performed prior to incision to verify patient's name, date of birth, medical record number, correct site of surgery correct procedure to be performed. The timeout was also used to verify the patient received antibiotics now appropriate instruments, implants and radiographic studies were available in the room. Once all in attendance were in agreement case began.    A posterolateral approach was utilized via sharp dissection  carried down to the subcutaneous tissue.  Bleeding vessels were coagulated using electrocautery.  The fascia lata was identified and incised along the length of the skin incision.  The gluteus maximus muscle was then split in line with its fibers. Self-retaining retractors were  inserted.  With the hip internally rotated, the short external rotators  were identified and removed from the posterior attachment from the greater trochanter. The piriformis was tagged for later repair. The capsule was identified and a T-shaped capsulotomy was performed. The capsule was tagged with #2 Tycron for later repair.  The femoral neck fracture was exposed, and the femoral head was removed using a corkscrew device. This was measured to be 45 mm in diameter. The attention was then turned to proximal femur preparation.  The fracture was  noted to extend into the medial calcar. There was comminution at the medial calcar. An oscillating saw was used to perform a proximal femoral osteotomy 1 fingerbreadth above the lesser trochanter. The trial 45 mm femoral head was placed into the acetabulum and had an excellent suction fit. The  attention was then turned back to femoral preparation.    A femoral skid and Cobra retractor were placed under the femoral neck to allow for adequate visualization.   A box osteotome was used to make the initial entry into the proximal femur. A single hand reamer was used to prepare the femoral canal. A T-shaped femoral canal sounder was then used to ensure no penetration femoral cortex had occurred during reaming. A Dall-Miles cable was placed around the medial calcar to avoid propagation of the fracture involving the medial calcar.  The cable was tensioned to avoid movement of the cable.   The proximal femur was then sequentially broached by hand. A 4.5 femoral trial broach was found to have best medial to lateral canal fit. Once adequate mediolateral canal fill was achieved the trial femoral broach, neck, and head was assembled and the hip was reduced. It was found to have excellent stability, equivalent leg lengths with functional range of motion. A portable AP pelvis was taken in the operating room to confirm adequate sizing of the femoral prosthesis and to evaluate leg lengths. The trial components were found to provide excellent medial to lateral femoral canal fill and equivalent leg lengths.  The trial components were then removed.  I copiously irrigated the femoral canal and then impacted the actual Accolade TMZF size 4.5 femoral prosthesis into place into the appropriate version, slightly anteverted to the normal anatomy, and I impacted the actual 45 mm Stryker Unitrax femoral component with a +0 neck adjustment sleeve into place. The hip was then reduced and taken through functional range of motion and found to have excellent stability. Leg lengths were restored. The hip joint was copiously irrigated.   A soft tissue repair of the capsule and external rotators was performed using #2 Tycron Excellent posterior capsular repair was achieved. The fascia lata was then closed with interrupted 0 Vicryl  suture. The subcutaneous tissues were closed with 2-0 Vicryl and the skin approximated with staples.   The patient was then placed supine on the operative table. Leg lengths were checked clinically and found to be equivalent. An abduction pillow was placed between the lower extremities. The patient was then transferred to a hospital bed and brought to the PACU in stable condition. I was scrubbed and present the entire case and all sharp and instrument counts were correct at the conclusion of the case. An abduction pillow was placed between the patient's legs.  I spoke with the patient's son in the patient's hospital room to let him know the case was completed without complication patient was stable in recovery room.   Timoteo Gaul, MD Orthopedic Surgeon

## 2016-02-07 NOTE — Anesthesia Preprocedure Evaluation (Signed)
Anesthesia Evaluation  Patient identified by MRN, date of birth, ID band Patient awake    Reviewed: Allergy & Precautions, H&P , NPO status , Patient's Chart, lab work & pertinent test results  Airway Mallampati: III  TM Distance: >3 FB Neck ROM: full    Dental  (+) Edentulous Upper, Edentulous Lower   Pulmonary    Pulmonary exam normal        Cardiovascular negative cardio ROS Normal cardiovascular exam     Neuro/Psych CVA negative psych ROS   GI/Hepatic negative GI ROS, Neg liver ROS,   Endo/Other  negative endocrine ROS  Renal/GU negative Renal ROS  negative genitourinary   Musculoskeletal   Abdominal   Peds  Hematology negative hematology ROS (+)   Anesthesia Other Findings   Reproductive/Obstetrics negative OB ROS                             Anesthesia Physical Anesthesia Plan  ASA: III and emergent  Anesthesia Plan: Spinal   Post-op Pain Management:    Induction:   Airway Management Planned:   Additional Equipment:   Intra-op Plan:   Post-operative Plan:   Informed Consent: I have reviewed the patients History and Physical, chart, labs and discussed the procedure including the risks, benefits and alternatives for the proposed anesthesia with the patient or authorized representative who has indicated his/her understanding and acceptance.     Plan Discussed with: Anesthesiologist  Anesthesia Plan Comments:         Anesthesia Quick Evaluation

## 2016-02-07 NOTE — ED Triage Notes (Signed)
Patient presents to Emergency Department via EMS with complaints of fall yesterday.  Right leg shortened and externally rotated.  Pt has feces under nails and in diaper.  Reports 9/10 pain in right hip.

## 2016-02-07 NOTE — Progress Notes (Signed)
Anticoagulation monitoring(Lovenox):  71 yo  female ordered Lovenox 30 mg Q24h  Filed Weights   02/07/16 0135 02/07/16 0544  Weight: 114 lb (51.7 kg) 111 lb 6.4 oz (50.5 kg)   BMI    Lab Results  Component Value Date   CREATININE 1.16 (H) 02/07/2016   CREATININE 1.21 (H) 02/07/2016   Estimated Creatinine Clearance: 32.9 mL/min (by C-G formula based on SCr of 1.16 mg/dL (H)). Hemoglobin & Hematocrit     Component Value Date/Time   HGB 13.3 02/07/2016 0617   HCT 40.0 02/07/2016 0617     Per Protocol for Patient with estCrcl > 30 ml/min and BMI < 40, will transition to Lovenox 40 mg Q24h.

## 2016-02-07 NOTE — OR Nursing (Signed)
Patient's upper denture and purse given to Darlina Sicilian, Therapist, sports.

## 2016-02-07 NOTE — ED Notes (Signed)
Pt cleaned up from having feces on nearly all parts of body, new diaper put on

## 2016-02-07 NOTE — Clinical Social Work Placement (Signed)
   CLINICAL SOCIAL WORK PLACEMENT  NOTE  Date:  02/07/2016  Patient Details  Name: Melissa Watkins MRN: [id number] Date of Birth: 1945/09/17  Clinical Social Work is seeking post-discharge placement for this patient at the [place] level of care (*CSW will initial, date and re-position this form in  chart as items are completed):  Yes   Patient/family provided with [facility] Work Department's list of facilities offering this level of care within the geographic area requested by the patient (or if unable, by the patient's family).  Yes   Patient/family informed of their freedom to choose among providers that offer the needed level of care, that participate in Medicare, Medicaid or managed care program needed by the patient, have an available bed and are willing to accept the patient.  Yes   Patient/family informed of [facility]'s ownership interest in [facility] and [facility], as well as of the fact that they are under no obligation to receive care at these facilities.  PASRR submitted to EDS on 02/07/16     PASRR number received on 02/07/16     Existing PASRR number confirmed on       FL2 transmitted to all facilities in geographic area requested by pt/family on 02/07/16     FL2 transmitted to all facilities within larger geographic area on       Patient informed that his/her managed care company has contracts with or will negotiate with certain facilities, including the following:            Patient/family informed of bed offers received.  Patient chooses bed at       Physician recommends and patient chooses bed at      Patient to be transferred to   on  .  Patient to be transferred to facility by       Patient family notified on   of transfer.  Name of family member notified:        PHYSICIAN       Additional Comment:    _______________________________________________ Cadynce Garrette, Veronia Beets, LCSW 02/07/2016, 7:12 PM

## 2016-02-08 LAB — BASIC METABOLIC PANEL
Anion gap: 9 (ref 5–15)
BUN: 37 mg/dL — ABNORMAL HIGH (ref 6–20)
CALCIUM: 8.4 mg/dL — AB (ref 8.9–10.3)
CO2: 21 mmol/L — AB (ref 22–32)
Chloride: 110 mmol/L (ref 101–111)
Creatinine, Ser: 1.24 mg/dL — ABNORMAL HIGH (ref 0.44–1.00)
GFR, EST AFRICAN AMERICAN: 50 mL/min — AB (ref >60)
GFR, EST NON AFRICAN AMERICAN: 43 mL/min — AB (ref >60)
Glucose, Bld: 86 mg/dL (ref 65–99)
Potassium: 3.6 mmol/L (ref 3.5–5.1)
SODIUM: 140 mmol/L (ref 135–145)

## 2016-02-08 LAB — CBC
HCT: 34 % — ABNORMAL LOW (ref 35.0–47.0)
Hemoglobin: 11.4 g/dL — ABNORMAL LOW (ref 12.0–16.0)
MCH: 31 pg (ref 26.0–34.0)
MCHC: 33.5 g/dL (ref 32.0–36.0)
MCV: 92.5 fL (ref 80.0–100.0)
PLATELETS: 168 10*3/uL (ref 150–440)
RBC: 3.67 MIL/uL — ABNORMAL LOW (ref 3.80–5.20)
RDW: 14.8 % — AB (ref 11.5–14.5)
WBC: 14.5 10*3/uL — AB (ref 3.6–11.0)

## 2016-02-08 MED ORDER — CEPHALEXIN 500 MG PO CAPS
500.0000 mg | ORAL_CAPSULE | Freq: Two times a day (BID) | ORAL | Status: DC
  Administered 2016-02-08 – 2016-02-09 (×3): 500 mg via ORAL
  Filled 2016-02-08 (×3): qty 1

## 2016-02-08 MED ORDER — HYDROCODONE-ACETAMINOPHEN 7.5-325 MG PO TABS
1.0000 | ORAL_TABLET | ORAL | Status: DC | PRN
  Administered 2016-02-08 (×2): 1 via ORAL
  Administered 2016-02-09 (×2): 2 via ORAL
  Administered 2016-02-09 – 2016-02-10 (×3): 1 via ORAL
  Filled 2016-02-08: qty 1
  Filled 2016-02-08: qty 2
  Filled 2016-02-08 (×4): qty 1
  Filled 2016-02-08: qty 2

## 2016-02-08 NOTE — Progress Notes (Signed)
Pt came back on the floor per bed from PACU accompanied by RN Amy, alert and oriented to person, place and situation. Post hemiarthroplasty of right femoral fracture with honeycomb dressing on operative site clean, dry and intact. Both lower extremities responds to commands, weak against resistance, bilateral pedal pulses moderate. Pt c/o pain on the surgical site, will administer PRN pain medicine. Fluids infusing well via peripheral IV on the left forearm. Vital signs taken by NT, stable and afebrile. Sons at bedside. Will continue to monitor.

## 2016-02-08 NOTE — Care Management (Signed)
PT recommending SNF. CSW seeking placement. Will assist as needed.

## 2016-02-08 NOTE — Anesthesia Postprocedure Evaluation (Signed)
Anesthesia Post Note  Patient: Melissa Watkins  Procedure(s) Performed: Procedure(s) (LRB): ARTHROPLASTY BIPOLAR HIP (HEMIARTHROPLASTY) (Right)  Anesthesia Type: Spinal Level of consciousness: awake, awake and alert and oriented Pain management: pain level controlled Respiratory status: spontaneous breathing Cardiovascular status: stable Postop Assessment: no headache and no backache Anesthetic complications: no     Last Vitals:  Vitals:   02/08/16 0831 02/08/16 1212  BP: (!) 165/71 (!) 156/60  Pulse: (!) 108 (!) 117  Resp:  17  Temp: 37.2 C 36.9 C    Last Pain:  Vitals:   02/08/16 1214  TempSrc:   PainSc: 0-No pain                 Brieanne Mignone,  [place] Semrad R

## 2016-02-08 NOTE — Progress Notes (Signed)
[facility] [place] NAME: Melissa Watkins    MR#:  [id number]  DATE OF BIRTH:  [birth year]  SUBJECTIVE:  Came in after fall, mechanical POD #1 No complaints  REVIEW OF SYSTEMS:   Review of Systems  Constitutional: Negative for chills, fever and weight loss.  HENT: Negative for ear discharge, ear pain and nosebleeds.   Eyes: Negative for blurred vision, pain and discharge.  Respiratory: Negative for sputum production, shortness of breath, wheezing and stridor.   Cardiovascular: Negative for chest pain, palpitations, orthopnea and PND.  Gastrointestinal: Negative for abdominal pain, diarrhea, nausea and vomiting.  Genitourinary: Negative for frequency and urgency.  Musculoskeletal: Positive for falls and joint pain. Negative for back pain.  Neurological: Negative for sensory change, speech change, focal weakness and weakness.  Psychiatric/Behavioral: Negative for depression and hallucinations. The patient is not nervous/anxious.    Tolerating Diet:yes Tolerating PT: pending  DRUG ALLERGIES:   Allergies  Allergen Reactions  . Oxycodone-Acetaminophen Other (See Comments)    Hallucinations  . Valium [Diazepam]     VITALS:  Blood pressure (!) 113/50, pulse (!) 116, temperature 97.7 F (36.5 C), temperature source Oral, resp. rate 18, height 5' (1.524 m), weight 50.5 kg (111 lb 6.4 oz), SpO2 98 %.  PHYSICAL EXAMINATION:   Physical Exam  GENERAL:  71 y.o.-year-old patient lying in the bed with no acute distress.  EYES: Pupils equal, round, reactive to light and accommodation. No scleral icterus. Extraocular muscles intact.  HEENT: Head atraumatic, normocephalic. Oropharynx and nasopharynx clear.  NECK:  Supple, no jugular venous distention. No thyroid enlargement, no tenderness.  LUNGS: Normal breath sounds bilaterally, no wheezing, rales, rhonchi. No use of accessory muscles of respiration.  CARDIOVASCULAR: S1, S2 normal. No  murmurs, rubs, or gallops.  ABDOMEN: Soft, nontender, nondistended. Bowel sounds present. No organomegaly or mass.  EXTREMITIES: No cyanosis, clubbing or edema b/l.    NEUROLOGIC: Cranial nerves II through XII are intact. No focal Motor or sensory deficits b/l.   PSYCHIATRIC:  patient is alert and oriented x 3.  SKIN: No obvious rash, lesion, or ulcer.   LABORATORY PANEL:  CBC  Recent Labs Lab 02/08/16 0421  WBC 14.5*  HGB 11.4*  HCT 34.0*  PLT 168    Chemistries   Recent Labs Lab 02/07/16 0143  02/07/16 1511 02/08/16 0421  NA 139  < >  --  140  K 3.0*  < >  --  3.6  CL 106  < >  --  110  CO2 24  < >  --  21*  GLUCOSE 136*  < >  --  86  BUN 28*  < >  --  37*  CREATININE 1.21*  < >  --  1.24*  CALCIUM 9.0  < >  --  8.4*  MG  --   --  1.8  --   AST 36  --   --   --   ALT 20  --   --   --   ALKPHOS 80  --   --   --   BILITOT 0.8  --   --   --   < > = values in this interval not displayed. Cardiac Enzymes No results for input(s): TROPONINI in the last 168 hours. RADIOLOGY:  Dg Chest 1 View  Result Date: 02/07/2016 CLINICAL DATA:  Fall.  Right hip fracture. EXAM: CHEST 1 VIEW COMPARISON:  None. FINDINGS: Shallow inspiration. Normal heart size and  pulmonary vascularity. No focal airspace disease or consolidation in the lungs. No blunting of costophrenic angles. No pneumothorax. Mediastinal contours appear intact. Tortuous aorta. IMPRESSION: No active disease. Electronically Signed   By: Lucienne Capers M.D.   On: 02/07/2016 02:38   Dg Pelvis 1-2 Views  Result Date: 02/07/2016 CLINICAL DATA:  Hip fracture. EXAM: PELVIS - 1-2 VIEW COMPARISON:  02/07/2016 FINDINGS: On the side marked R for Right, we demonstrate a rasp-like margin along the femoral stem portion of a right hip prosthesis. This has serrated margins similar to a rasp. There appears to be a cerclage in place although the lateral margin of the cerclage is omitted and accordingly continuity of the cerclage or the  reason the cerclage was placed is not readily visible. The stem has a flat disc femoral head margin with several plate like lmibs inferiorly, anteriorly, and posteriorly, and extends into a very faintly radiolucent femoral head component, presumably an unusual prosthesis or a sizing portion of the prosthesis. This has a hazy density without trabeculation so I do not think it represents the original femoral head. Gas tracks within the soft tissues in the region of the implant, and overlies the pubic rami. IMPRESSION: 1. Radiographs demonstrate a rasp-like, serrated margin device in the right femur corresponding to the expected location of the hip implant. I am unsure whether this is simply an unusually serrated-margined implant or whether this is a rasp device. It appears to have a proximal disc component along the femoral head along with several fixation plate limbs into what is presumably a radiolucent prosthetic femoral head. There is a cerclage wire in place which is only partially included on today's exam. The lateral margin of the cerclage wire is not included. Electronically Signed   By: Van Clines M.D.   On: 02/07/2016 20:21   Dg Hip Port Unilat With Pelvis 1v Right  Result Date: 02/07/2016 CLINICAL DATA:  Postoperative pain. EXAM: DG HIP (WITH OR WITHOUT PELVIS) 1V PORT RIGHT COMPARISON:  RIGHT hip radiographs February 07, 2016 at 0203 hours FINDINGS: Status post RIGHT hip hemiarthroplasty with intact well-seated non cemented components. Proximal femoral cerclage wire. Osteopenia without destructive bony lesions. Linear lucency RIGHT superolateral acetabulum. RIGHT hip subcutaneous gas, soft tissue swelling and skin staples. IMPRESSION: Status post recent RIGHT hip hemiarthroplasty. Suspected nondisplaced acetabular fracture. Electronically Signed   By: Elon Alas M.D.   On: 02/07/2016 21:31   Dg Hip Unilat With Pelvis 2-3 Views Right  Result Date: 02/07/2016 CLINICAL DATA:  Patient fell 1  day ago after losing balance. EXAM: DG HIP (WITH OR WITHOUT PELVIS) 2-3V RIGHT COMPARISON:  None. FINDINGS: There is an acute transverse fracture of the subcapital right proximal femur with varus angulation of the fracture fragments. No dislocation at the hip joint. Possible nondisplaced acute fracture of the right superior pubic ramus. Probable old fracture deformities of the left superior and inferior pubic rami. Diffuse bone demineralization. No destructive bone lesions. Soft tissues are unremarkable. IMPRESSION: Acute transverse fracture of the subcapital right proximal femur with varus angulation of the fracture fragments. Electronically Signed   By: Lucienne Capers M.D.   On: 02/07/2016 02:37   ASSESSMENT AND PLAN:   Melissa Watkins  is a 71 y.o. female with a known history of chronic back pain presented to the emergency room after she had a fall yesterday. Patient lost balance and fell on her right hip yesterday at home in the evening. Since this morning she had a lot of pain in the  right hip area and she was very uncomfortable  1. Right proximal femur fracture s/p mechanical fall at home s/p surgery POD#1 -pt denies any Cp or sob. -she has no cardiac history. -echo 2015 showed EF 55-60% -EKg NSR and LVH  2. HTN -not on any meds ( used to be on it ) -on metoprolol in the perioperative period  3. Hypokalemia -repleted  4. Leukocytosis appears reactive  5. Dehydration -cont IVF  6. DVT prophylaxis Lovenox Case discussed with Care Management/Social Worker. Management plans discussed with the patient, family and they are in agreement.  CODE STATUS: full  DVT Prophylaxis: as above  TOTAL TIME TAKING CARE OF THIS PATIENT: 30 minutes.  >50% time spent on counselling and coordination of care  POSSIBLE D/C IN 2-3 DAYS, DEPENDING ON CLINICAL CONDITION.  Note: This dictation was prepared with Dragon dictation along with smaller phrase technology. Any transcriptional errors that  result from this process are unintentional.  Sakib Noguez M.D on 02/08/2016 at 8:12 PM  Between 7am to 6pm - Pager - [phone]  After 6pm go to [email or web address] - password [email or web address] [facility]  [facility]  Office  [phone]  CC: Primary care physician; No PCP Per Patient

## 2016-02-08 NOTE — Progress Notes (Addendum)
PT is recommending SNF. Social work intern met with patient and patient's sons, Michael and William at bedside. Social work intern introduced herself and presented bed offers. Patient sons and patient have chosen [facility]. Clinical Social Worker (CSW) left a message with Debra, admissions coordinator at [facility] making her aware of accepted bed offer. Social work intern will continue to assist as needed.   MacKenzie Rayfield, Social Work Intern  [phone] 

## 2016-02-08 NOTE — Evaluation (Signed)
Physical Therapy Evaluation Patient Details Name: Melissa Watkins MRN: [id number] DOB: [birth year] Today's Date: 02/08/2016   History of Present Illness  Pt. is a 71 y.o. female who was admitted to [facility] for a posterior approach hemiarthroplasty repair (02/07/16) of a right femoral neck fracture.    Clinical Impression  Pt willing to participate in PT evaluation despite complaints of pain in R hip. Her O2 was 96% and HR 110 while on 2l/min of O2 via nasal cannula. During treatment we removed her from O2 after consulting with nurses and O2 sat stayed at 91%. O2 was reapplied to pt and the end of treatment. Incision site appears to be healing normally with no signs of infection. Her general sensation was [facility] for B LE . Patient's strength was grossly 3-/5 on R LE and 3/5 for L LE , UE appeared to be [facility]. Patient also displays deficits in ROM and is unable to achieve neutral dorsiflexion for B LE . Patient required max assit x 2 for moving from supine to sitting to ensure hip precautions were followed. She as able to maintain sitting balance with min guarding for 10 seconds but then required mod assistance. She stated she ambulates with a wc at baseline and is able to transfer from the wc to bed and bathroom with modified independence. Pt currently displays severe deficits in bed mobility and overall strength. She will benefit from skilled PT and we recommend she be admitted to a STR upon dc.     Follow Up Recommendations SNF    Equipment Recommendations       Recommendations for Other Services       Precautions / Restrictions Precautions Precautions: Fall;Posterior Hip Precaution Booklet Issued: No Precaution Comments: Posterior Hip precautions. Restrictions Weight Bearing Restrictions: Yes RLE Weight Bearing: Weight bearing as tolerated      Mobility  Bed Mobility Overal bed mobility: Needs Assistance Bed Mobility: Supine to Sit     Supine to sit: Max assist;+2 for safety/equipment      General bed mobility comments: Required verbal cues and assist x2 to maintain hip precautions. Requires assitance sitting up, scooting and moving legs over the edge of the bed  Transfers Overall transfer level:  (Did not attempt at this time due to pain and low activity tolerance)                  Ambulation/Gait Ambulation/Gait assistance:  (Did not attempt at this time, pt uses wc to ambulate at baseline)              Stairs            Wheelchair Mobility    Modified Rankin (Stroke Patients Only)       Balance Overall balance assessment: Needs assistance Sitting-balance support: Bilateral upper extremity supported Sitting balance-Leahy Scale: Fair Sitting balance - Comments: was able to maintain independent sitting balance for 10 seconds, but required PT assistance immediately after to maintain upright posture  Postural control: Posterior lean     Standing balance comment: Did not attempt at this time                             Pertinent Vitals/Pain Pain Assessment: 0-10 Pain Score: 8  Pain Location: RIght Hip Pain Descriptors / Indicators: Aching Pain Intervention(s): Monitored during session    Home Living Family/patient expects to be discharged to:: Private residence Living Arrangements: Children Available Help at Discharge: Family Type of Home:  House Home Access: Level entry     Home Layout: One level Home Equipment: [place] - 2 wheels;Wheelchair - manual      Prior Function Level of Independence: Independent with assistive device(s)   Gait / Transfers Assistance Needed: ambulates and transfers into wc at baseline            Hand Dominance   Dominant Hand: Right    Extremity/Trunk Assessment   Upper Extremity Assessment Upper Extremity Assessment: Overall WFL for tasks assessed    Lower Extremity Assessment Lower Extremity Assessment: Generalized weakness (L LE grossly 3/5, R LE grossly 3-/5)        Communication   Communication: HOH  Cognition Arousal/Alertness: Awake/alert Behavior During Therapy: WFL for tasks assessed/performed Overall Cognitive Status: Within Functional Limits for tasks assessed                      General Comments      Exercises Other Exercises Other Exercises: Supine AAROM - short arc quads, ankle pumps, hip abd, heel slides - 1x10 with min to mod assist from PT, performed on L LE (requires frequent verbal cues and instructions) Other Exercises: Sitting AROM - Long arc quads - 1x4- unable to move through full available ROM independently, both R and L LE    Assessment/Plan    PT Assessment Patient needs continued PT services  PT Problem List Decreased strength;Decreased range of motion;Decreased activity tolerance;Decreased balance;Decreased mobility;Decreased knowledge of precautions;Pain;Decreased knowledge of use of DME;Cardiopulmonary status limiting activity          PT Treatment Interventions DME instruction;Functional mobility training;Therapeutic exercise;Balance training;Therapeutic activities;Patient/family education;Wheelchair mobility training;Gait training    PT Goals (Current goals can be found in the Care Plan section)  Acute Rehab PT Goals Patient Stated Goal: To return home PT Goal Formulation: With patient Time For Goal Achievement: 02/22/16 Potential to Achieve Goals: Fair    Frequency BID   Barriers to discharge Inaccessible home environment      Co-evaluation               End of Session Equipment Utilized During Treatment: Oxygen (2 L/min) Activity Tolerance: Patient limited by pain Patient left: in bed;with call bell/phone within reach;with bed alarm set (with hip abd wedge between legs)           Time: 5520-8022 PT Time Calculation (min) (ACUTE ONLY): 40 min   Charges:         PT G Codes:        Melissa [facility] 02/08/2016, 11:43 AM

## 2016-02-08 NOTE — Progress Notes (Signed)
PT Cancellation Note  Patient Details Name: Melissa Watkins MRN: [id number] DOB: 08-11-1945   Cancelled Treatment:    Reason Eval/Treat Not Completed: Pain limiting ability to participate (Pt refused treatment after insisting multiple times. She stated pain was 9/10 and did not want to participate this afternoon. Son was in the room and stated nursing was in the process of giving her pain meds)   Orlene Och Student PT 02/08/2016, 4:38 PM

## 2016-02-08 NOTE — Progress Notes (Signed)
Subjective:  Postoperative day #1 status post right hip hemiarthroplasty. Patient reports right hip pain as marked.  The patient's sons is at the bedside along with another adult female.  Objective:   VITALS:   Vitals:   02/08/16 0502 02/08/16 0831 02/08/16 0933 02/08/16 1212  BP: (!) 159/64 (!) 165/71  (!) 156/60  Pulse: (!) 116 (!) 108  (!) 117  Resp:    17  Temp:  99 F (37.2 C)  98.4 F (36.9 C)  TempSrc:  Oral  Oral  SpO2: 90% 96% 96% 93%  Weight:      Height:        PHYSICAL EXAM:  Right lower extremity: Patient has a small amount of serosanguineous drainage on her dressing. Her thigh compartments are soft and compressible.  Patient has palpable pedal pulses, intact sensation light touch and demonstrates slight laxity extension of her toes but her motion is limited due to pain.   LABS  Results for orders placed or performed during the hospital encounter of 02/07/16 (from the past 24 hour(s))  Magnesium     Status: None   Collection Time: 02/07/16  3:11 PM  Result Value Ref Range   Magnesium 1.8 1.7 - 2.4 mg/dL  Protime-INR     Status: None   Collection Time: 02/07/16  3:11 PM  Result Value Ref Range   Prothrombin Time 12.5 11.4 - 15.2 seconds   INR 0.93   APTT     Status: None   Collection Time: 02/07/16  3:11 PM  Result Value Ref Range   aPTT 31 24 - 36 seconds  CBC     Status: Abnormal   Collection Time: 02/08/16  4:21 AM  Result Value Ref Range   WBC 14.5 (H) 3.6 - 11.0 K/uL   RBC 3.67 (L) 3.80 - 5.20 MIL/uL   Hemoglobin 11.4 (L) 12.0 - 16.0 g/dL   HCT 34.0 (L) 35.0 - 47.0 %   MCV 92.5 80.0 - 100.0 fL   MCH 31.0 26.0 - 34.0 pg   MCHC 33.5 32.0 - 36.0 g/dL   RDW 14.8 (H) 11.5 - 14.5 %   Platelets 168 150 - 440 K/uL  Basic metabolic panel     Status: Abnormal   Collection Time: 02/08/16  4:21 AM  Result Value Ref Range   Sodium 140 135 - 145 mmol/L   Potassium 3.6 3.5 - 5.1 mmol/L   Chloride 110 101 - 111 mmol/L   CO2 21 (L) 22 - 32 mmol/L   Glucose, Bld 86 65 - 99 mg/dL   BUN 37 (H) 6 - 20 mg/dL   Creatinine, Ser 1.24 (H) 0.44 - 1.00 mg/dL   Calcium 8.4 (L) 8.9 - 10.3 mg/dL   GFR calc non Af Amer 43 (L) >60 mL/min   GFR calc Af Amer 50 (L) >60 mL/min   Anion gap 9 5 - 15    Dg Chest 1 View  Result Date: 02/07/2016 CLINICAL DATA:  Fall.  Right hip fracture. EXAM: CHEST 1 VIEW COMPARISON:  None. FINDINGS: Shallow inspiration. Normal heart size and pulmonary vascularity. No focal airspace disease or consolidation in the lungs. No blunting of costophrenic angles. No pneumothorax. Mediastinal contours appear intact. Tortuous aorta. IMPRESSION: No active disease. Electronically Signed   By: Lucienne Capers M.D.   On: 02/07/2016 02:38   Dg Pelvis 1-2 Views  Result Date: 02/07/2016 CLINICAL DATA:  Hip fracture. EXAM: PELVIS - 1-2 VIEW COMPARISON:  02/07/2016 FINDINGS: On the side marked R for  Right, we demonstrate a rasp-like margin along the femoral stem portion of a right hip prosthesis. This has serrated margins similar to a rasp. There appears to be a cerclage in place although the lateral margin of the cerclage is omitted and accordingly continuity of the cerclage or the reason the cerclage was placed is not readily visible. The stem has a flat disc femoral head margin with several plate like lmibs inferiorly, anteriorly, and posteriorly, and extends into a very faintly radiolucent femoral head component, presumably an unusual prosthesis or a sizing portion of the prosthesis. This has a hazy density without trabeculation so I do not think it represents the original femoral head. Gas tracks within the soft tissues in the region of the implant, and overlies the pubic rami. IMPRESSION: 1. Radiographs demonstrate a rasp-like, serrated margin device in the right femur corresponding to the expected location of the hip implant. I am unsure whether this is simply an unusually serrated-margined implant or whether this is a rasp device. It appears to  have a proximal disc component along the femoral head along with several fixation plate limbs into what is presumably a radiolucent prosthetic femoral head. There is a cerclage wire in place which is only partially included on today's exam. The lateral margin of the cerclage wire is not included. Electronically Signed   By: Van Clines M.D.   On: 02/07/2016 20:21   Dg Hip Port Unilat With Pelvis 1v Right  Result Date: 02/07/2016 CLINICAL DATA:  Postoperative pain. EXAM: DG HIP (WITH OR WITHOUT PELVIS) 1V PORT RIGHT COMPARISON:  RIGHT hip radiographs February 07, 2016 at 0203 hours FINDINGS: Status post RIGHT hip hemiarthroplasty with intact well-seated non cemented components. Proximal femoral cerclage wire. Osteopenia without destructive bony lesions. Linear lucency RIGHT superolateral acetabulum. RIGHT hip subcutaneous gas, soft tissue swelling and skin staples. IMPRESSION: Status post recent RIGHT hip hemiarthroplasty. Suspected nondisplaced acetabular fracture. Electronically Signed   By: Elon Alas M.D.   On: 02/07/2016 21:31   Dg Hip Unilat With Pelvis 2-3 Views Right  Result Date: 02/07/2016 CLINICAL DATA:  Patient fell 1 day ago after losing balance. EXAM: DG HIP (WITH OR WITHOUT PELVIS) 2-3V RIGHT COMPARISON:  None. FINDINGS: There is an acute transverse fracture of the subcapital right proximal femur with varus angulation of the fracture fragments. No dislocation at the hip joint. Possible nondisplaced acute fracture of the right superior pubic ramus. Probable old fracture deformities of the left superior and inferior pubic rami. Diffuse bone demineralization. No destructive bone lesions. Soft tissues are unremarkable. IMPRESSION: Acute transverse fracture of the subcapital right proximal femur with varus angulation of the fracture fragments. Electronically Signed   By: Lucienne Capers M.D.   On: 02/07/2016 02:37    Assessment/Plan: 1 Day Post-Op   Principal Problem:   Hip  fracture ([place])  Patient is stable postop. Her hemoglobin and hematocrit remained within acceptable limits. Patient's main complaint is pain. Patient has chronic low back pain as well. I explained to the family that this will need to be worked up as an outpatient. I'm going to place a pain management consult for her. Continue PT as patient can tolerate. She will need SNF upon discharge.    [place] , MD 02/08/2016, 2:22 PM

## 2016-02-08 NOTE — Evaluation (Signed)
Occupational Therapy Evaluation Patient Details Name: Melissa Watkins MRN: [id number] DOB: February 13, 1945 Today's Date: 02/08/2016    History of Present Illness Pt. is a 71 y.o. female who was admitted to [facility] for a posterior approach hemiarthroplasty repair of a right femoral neck fracture.   Clinical Impression   Pt. Is a 71 y.o. Female who was admitted to [facility] for a posterior Approach hemiarthroplasty repair of a right femoral neck fracture. Pt. Presents with weakness, pain, decreased functional mobility, and posterior hip precautions which hinder her ability to complete ADL and IADL functioning. Pt. Could benefit from skilled OT services for ADL training, A/E training, UE there. Ex, there. Activities, and pt. education about posterior hip precautions and DME. Pt. Could benefit from SNF level of care.    Follow Up Recommendations  SNF    Equipment Recommendations       Recommendations for Other Services       Precautions / Restrictions Precautions Precautions: Fall;Posterior Hip Precaution Booklet Issued: No Precaution Comments: Posterior Hip precautions. Restrictions Weight Bearing Restrictions: Yes RLE Weight Bearing: Weight bearing as tolerated         Balance Overall balance assessment: Needs assistance Sitting-balance support: Bilateral upper extremity supported Sitting balance-Leahy Scale: Fair Sitting balance - Comments: was able to maintain independent sitting balance for 10 seconds, but required PT assistance immediately after to maintain upright posture  Postural control: Posterior lean     Standing balance comment: Did not attempt at this time                            ADL Overall ADL's : Needs assistance/impaired Eating/Feeding: Set up   Grooming: Set up                                 General ADL Comments: Pt. education was provided about A/E use for LE ADLs, and posterior hip precautions.     Vision     Perception      Praxis      Pertinent Vitals/Pain Pain Assessment: 0-10 Pain Score: 8  Pain Location: RIght Hip Pain Descriptors / Indicators: Aching Pain Intervention(s): Monitored during session     Hand Dominance Right   Extremity/Trunk Assessment Upper Extremity Assessment Upper Extremity Assessment: Overall WFL for tasks assessed       Communication Communication Communication: HOH   Cognition Arousal/Alertness: Awake/alert Behavior During Therapy: WFL for tasks assessed/performed Overall Cognitive Status: Within Functional Limits for tasks assessed                     General Comments       Exercises   Shoulder Instructions     Home Living Family/patient expects to be discharged to:: Private residence Living Arrangements: Children Available Help at Discharge: Family Type of Home: House Home Access: Level entry     Home Layout: One level               Home Equipment: Environmental consultant - 2 wheels;Wheelchair - manual          Prior Functioning/Environment Level of Independence: Independent with assistive device(s)  Gait / Transfers Assistance Needed: ambulates and transfers into wc at baseline               OT Problem List: Decreased strength;Pain;Decreased knowledge of use of DME or AE;Decreased knowledge of precautions;Decreased range of motion   OT  Treatment/Interventions: Self-care/ADL training;Therapeutic exercise;DME and/or AE instruction;Patient/family education    OT Goals(Current goals can be found in the care plan section) Acute Rehab OT Goals Patient Stated Goal: To return home OT Goal Formulation: With patient Potential to Achieve Goals: Good  OT Frequency: Min 1X/week   Barriers to D/C:            Co-evaluation              End of Session    Activity Tolerance: Patient tolerated treatment well Patient left: in bed;with call bell/phone within reach   Time: 0952-1012 OT Time Calculation (min): 20 min Charges:  OT General  Charges $OT Visit: 1 Procedure OT Evaluation $OT Eval Moderate Complexity: 1 Procedure G-Codes:    Harrel Carina, MS, OTR/L 02/08/2016, 11:02 AM

## 2016-02-08 NOTE — Progress Notes (Signed)
Social work Theatre manager met with patient and patient's son [name] at bedside. Social work Theatre manager explained that [facility] is a non-smoking facility and patient could not smoke while she is there. Both the patient and son verbally agreed they understood. Social work Theatre manager will continue to assist as needed.   Danie Chandler, Social Work Intern  [phone]

## 2016-02-09 ENCOUNTER — Inpatient Hospital Stay: Payer: Medicare Other

## 2016-02-09 ENCOUNTER — Encounter: Payer: Self-pay | Admitting: Orthopedic Surgery

## 2016-02-09 LAB — BASIC METABOLIC PANEL
ANION GAP: 9 (ref 5–15)
BUN: 85 mg/dL — ABNORMAL HIGH (ref 6–20)
CHLORIDE: 109 mmol/L (ref 101–111)
CO2: 21 mmol/L — AB (ref 22–32)
Calcium: 8.4 mg/dL — ABNORMAL LOW (ref 8.9–10.3)
Creatinine, Ser: 1.48 mg/dL — ABNORMAL HIGH (ref 0.44–1.00)
GFR calc Af Amer: 41 mL/min — ABNORMAL LOW (ref >60)
GFR calc non Af Amer: 35 mL/min — ABNORMAL LOW (ref >60)
Glucose, Bld: 148 mg/dL — ABNORMAL HIGH (ref 65–99)
POTASSIUM: 4 mmol/L (ref 3.5–5.1)
Sodium: 139 mmol/L (ref 135–145)

## 2016-02-09 LAB — CBC
HEMATOCRIT: 23.2 % — AB (ref 35.0–47.0)
HEMOGLOBIN: 7.6 g/dL — AB (ref 12.0–16.0)
MCH: 30.9 pg (ref 26.0–34.0)
MCHC: 32.8 g/dL (ref 32.0–36.0)
MCV: 94.1 fL (ref 80.0–100.0)
Platelets: 185 10*3/uL (ref 150–440)
RBC: 2.47 MIL/uL — AB (ref 3.80–5.20)
RDW: 14.4 % (ref 11.5–14.5)
WBC: 12.2 10*3/uL — AB (ref 3.6–11.0)

## 2016-02-09 LAB — URINE CULTURE

## 2016-02-09 LAB — ABO/RH: ABO/RH(D): O POS

## 2016-02-09 LAB — SURGICAL PATHOLOGY

## 2016-02-09 LAB — PREPARE RBC (CROSSMATCH)

## 2016-02-09 LAB — HEMOGLOBIN: Hemoglobin: 7.6 g/dL — ABNORMAL LOW (ref 12.0–16.0)

## 2016-02-09 MED ORDER — CEPHALEXIN 250 MG PO CAPS
250.0000 mg | ORAL_CAPSULE | Freq: Two times a day (BID) | ORAL | Status: DC
  Administered 2016-02-09 – 2016-02-10 (×2): 250 mg via ORAL
  Filled 2016-02-09 (×2): qty 1

## 2016-02-09 MED ORDER — SODIUM CHLORIDE 0.9 % IV SOLN
Freq: Once | INTRAVENOUS | Status: AC
  Administered 2016-02-09: 11:00:00 via INTRAVENOUS

## 2016-02-09 MED ORDER — ENSURE ENLIVE PO LIQD
237.0000 mL | Freq: Two times a day (BID) | ORAL | Status: DC
  Administered 2016-02-09 (×2): 237 mL via ORAL

## 2016-02-09 MED ORDER — METOPROLOL TARTRATE 25 MG PO TABS
25.0000 mg | ORAL_TABLET | Freq: Two times a day (BID) | ORAL | Status: DC
  Administered 2016-02-09 (×2): 25 mg via ORAL
  Filled 2016-02-09 (×3): qty 1

## 2016-02-09 MED ORDER — ENOXAPARIN SODIUM 30 MG/0.3ML ~~LOC~~ SOLN
30.0000 mg | SUBCUTANEOUS | Status: DC
Start: 2016-02-10 — End: 2016-02-10
  Administered 2016-02-10: 30 mg via SUBCUTANEOUS
  Filled 2016-02-09: qty 0.3

## 2016-02-09 NOTE — Progress Notes (Signed)
Plan is for patient to D/C to [facility] tomorrow if medically stable. Deborah admissions coordinator at [facility] is aware of above.   [employer], LCSW [phone]

## 2016-02-09 NOTE — Progress Notes (Signed)
Initial Nutrition Assessment  DOCUMENTATION CODES:   Non-severe (moderate) malnutrition in context of chronic illness  INTERVENTION:  1. Ensure Enlive po BID, each supplement provides 350 kcal and 20 grams of protein  NUTRITION DIAGNOSIS:   Malnutrition related to chronic illness as evidenced by severe depletion of muscle mass, moderate depletions of muscle mass, severe depletion of body fat.  GOAL:   Patient will meet greater than or equal to 90% of their needs  MONITOR:   PO intake, I & O's, Labs, Weight trends, Supplement acceptance  REASON FOR ASSESSMENT:   Other (Comment) (Hip Fx)    ASSESSMENT:   Melissa Watkins  is a 71 y.o. female with a known history of chronic back pain presented to the emergency room after she had a fall yesterday. Patient lost balance and fell on her right hip yesterday at home in the evening.  Spoke with Ms. Sean at bedside. No family available, she was unable to provide accurate history. Also hard of hearing.  Spoke with Marjory Lies RN, patient ate eggs, toast for breakfast consumed 20%. Nutrition-Focused physical exam completed. Findings are moderate-severe fat depletion, moderate-severe muscle depletion, and no edema.  Has dentures No evidence of chewing/swallowing problems. No nausea/vomiting  Labs and medications reviewed: Iron, Colace, Senokot NS @ 53m/hr  Diet Order:  Diet heart healthy/carb modified Room service appropriate? Yes; Fluid consistency: Thin  Skin:  Reviewed, no issues  Last BM:  02/08/2016  Height:   Ht Readings from Last 1 Encounters:  02/07/16 5' (1.524 m)    Weight:   Wt Readings from Last 1 Encounters:  02/07/16 111 lb 6.4 oz (50.5 kg)    Ideal Body Weight:  45.45 kg  BMI:  Body mass index is 21.76 kg/m.  Estimated Nutritional Needs:   Kcal:  1261-1350 calories  Protein:  51-62 gm  Fluid:  >/= 1.2L  EDUCATION NEEDS:   No education needs identified at this time  WSatira Anis Reon Hunley, MS, RD  LDN Inpatient Clinical Dietitian Pager [phone]

## 2016-02-09 NOTE — Progress Notes (Signed)
Subjective:  Patient seen tonight in her hospital bed. Patient is asleep upon my arrival. She is arousable but confused. She does not appear uncomfortable. Patient is not in any acute distress.   Objective:   VITALS:   Vitals:   02/09/16 1049 02/09/16 1315 02/09/16 1503 02/09/16 2011  BP: 137/66 (!) 180/76 129/90 (!) 140/52  Pulse: 93 89 92 96  Resp: '18 16 18 19  '$ Temp: 98.4 F (36.9 C) 98.7 F (37.1 C) 98.8 F (37.1 C) 97.6 F (36.4 C)  TempSrc: Oral Oral Oral Oral  SpO2: 99% 99% 98% 97%  Weight:      Height:        PHYSICAL EXAM:  Right lower extremity: Patient has ecchymosis around her incision. She has moderate serosanguinous drainage on her bandage. Her thigh compartments are soft and compressible.  Patient is unable to cooperate with exam and therefore motor and sensory function cannot be determined. She has palpable pedal pulses.   LABS  Results for orders placed or performed during the hospital encounter of 02/07/16 (from the past 24 hour(s))  CBC     Status: Abnormal   Collection Time: 02/09/16  4:00 AM  Result Value Ref Range   WBC 12.2 (H) 3.6 - 11.0 K/uL   RBC 2.47 (L) 3.80 - 5.20 MIL/uL   Hemoglobin 7.6 (L) 12.0 - 16.0 g/dL   HCT 23.2 (L) 35.0 - 47.0 %   MCV 94.1 80.0 - 100.0 fL   MCH 30.9 26.0 - 34.0 pg   MCHC 32.8 32.0 - 36.0 g/dL   RDW 14.4 11.5 - 14.5 %   Platelets 185 150 - 440 K/uL  Basic metabolic panel     Status: Abnormal   Collection Time: 02/09/16  4:00 AM  Result Value Ref Range   Sodium 139 135 - 145 mmol/L   Potassium 4.0 3.5 - 5.1 mmol/L   Chloride 109 101 - 111 mmol/L   CO2 21 (L) 22 - 32 mmol/L   Glucose, Bld 148 (H) 65 - 99 mg/dL   BUN 85 (H) 6 - 20 mg/dL   Creatinine, Ser 1.48 (H) 0.44 - 1.00 mg/dL   Calcium 8.4 (L) 8.9 - 10.3 mg/dL   GFR calc non Af Amer 35 (L) >60 mL/min   GFR calc Af Amer 41 (L) >60 mL/min   Anion gap 9 5 - 15  Hemoglobin     Status: Abnormal   Collection Time: 02/09/16  7:41 AM  Result Value Ref Range   Hemoglobin 7.6 (L) 12.0 - 16.0 g/dL  ABO/Rh     Status: None   Collection Time: 02/09/16  7:41 AM  Result Value Ref Range   ABO/RH(D) O POS   Prepare RBC     Status: None   Collection Time: 02/09/16  8:11 AM  Result Value Ref Range   Order Confirmation ORDER PROCESSED BY BLOOD BANK     Dg Cervical Spine Complete  Result Date: 02/09/2016 CLINICAL DATA:  71 year old with chronic pain in the cervical, thoracic and lumbar spine. Recent hip surgery. No reported acute injury. EXAM: CERVICAL SPINE - COMPLETE 4+ VIEW COMPARISON:  None. FINDINGS: The bones are demineralized. The alignment is normal. No evidence of acute fracture. There is multilevel facet hypertrophy. The disc spaces are relatively preserved. Oblique views are suboptimal. There is possible osseous foraminal narrowing. IMPRESSION: Cervical spondylosis with possible osseous foraminal narrowing, suboptimally evaluated. No acute osseous findings. Electronically Signed   By: Richardean Sale M.D.   On: 02/09/2016 10:33  Dg Thoracic Spine 2 View  Result Date: 02/09/2016 CLINICAL DATA:  71 year old with chronic pain in the cervical, thoracic and lumbar spine. Recent hip surgery. No reported acute injury. EXAM: THORACIC SPINE 2 VIEWS COMPARISON:  Limited correlation made with one-view chest 02/07/2016. FINDINGS: The bones are diffusely demineralized. There are numerous age-indeterminate compression deformities, involving the T5, T7, T8, T11, T12 and L1 vertebral bodies. The L1 fracture is most severe. The alignment is near anatomic with a mild scoliosis. No widening of the interpedicular distance identified. IMPRESSION: Multiple age-indeterminate thoracic compression deformities. Consider MRI for further aging if there is concern of acute fracture. Electronically Signed   By: Richardean Sale M.D.   On: 02/09/2016 10:43   Dg Lumbar Spine 2-3 Views  Result Date: 02/09/2016 CLINICAL DATA:  71 year old with chronic pain in the cervical, thoracic and  lumbar spine. Recent hip surgery. No reported acute injury. EXAM: LUMBAR SPINE - 2-3 VIEW COMPARISON:  None. FINDINGS: Five lumbar type vertebral bodies. The bones are diffusely demineralized. There are multiple lower thoracic compression deformities. In addition, there are superior endplate compression deformities at L1 and L4. The L4 fracture results in greater than 50% loss vertebral body height anteriorly. No widening of the interpedicular distance or significant osseous retropulsion identified. Mild disc space narrowing is present at L5-S1. There is mild facet disease throughout the lumbar spine. Previous right hip arthroplasty noted. Lucency projecting over the right pubic bones could reflect a hernia in the appropriate clinical context. IMPRESSION: 1. Multiple thoracolumbar compression deformities, age indeterminate. Lumbar involvement is most severe at L4. These could be further evaluated with MRI if clinically warranted. 2. Lucency projecting over the right pubic bones, potentially a hernia. Correlate clinically. Electronically Signed   By: Richardean Sale M.D.   On: 02/09/2016 10:46    Assessment/Plan: 2 Days Post-Op   Principal Problem:   Hip fracture ([place])  Patient has significant drop in her hemoglobin and hematocrit today. This may be due to fluid dilution versus postop anemia. Patient was transfused 1 unit of packed red blood cells today. She will have a repeat CBC in the morning.  Continue physical therapy.  Patient will require SNF upon discharge.    Thornton Park , MD 02/09/2016, 10:06 PM

## 2016-02-09 NOTE — Progress Notes (Signed)
Physical Therapy Treatment Patient Details Name: Melissa Watkins MRN: [id number] DOB: [birth year] Today's Date: 02/09/2016    History of Present Illness Pt. is a 71 y.o. female who was admitted to [facility] for a posterior approach hemiarthroplasty repair (02/07/16) of a right femoral neck fracture. Hgb 7.4, scheduled for transfusions 02/09/16.     PT Comments    Pt displayed a decrease in cognitive status this morning and demonstrated confusion. Stated she was feeling "sore" in her R hip. Her Hgb is 7.4 this morning and nursing said she is stable enough for PT treatment. She was only able to perform ankle pumps without assistance, she required 90% assistance and frequent verbal cues to complete the remainder of her there ex (heel slides, hip abd, short arc quads). Pt also presented with positive clonus test bilaterally that was not noticed during initial evaluation. MD notified of findings and is performing imaging. Pt displayed a decrease in functional and cognitive status, and a decreased ability to participate in PT . PT frequency will be reduced to once a day. She will continue to benefit from skilled PT to improve strength and functional deficits.     Follow Up Recommendations  SNF     Equipment Recommendations       Recommendations for Other Services       Precautions / Restrictions Precautions Precautions: Fall;Posterior Hip Restrictions Weight Bearing Restrictions: Yes RLE Weight Bearing: Weight bearing as tolerated    Mobility  Bed Mobility               General bed mobility comments: Not appropriate at this time due to cognitive impairments that limit participation  Transfers                 General transfer comment: Did not attempt due to poor cognitive status and limited strength  Ambulation/Gait             General Gait Details: Did not attempt due to poor cognitive status and impaired strength    Stairs            Wheelchair Mobility     Modified Rankin (Stroke Patients Only)       Balance       Sitting balance - Comments: Did not attempt due to poor cognitive status        Standing balance comment: Did not attempt at this time                    Cognition Arousal/Alertness: Lethargic Behavior During Therapy: Flat affect Overall Cognitive Status: Impaired/Different from baseline Area of Impairment: Attention;Following commands   Current Attention Level: Selective   Following Commands: Follows one step commands inconsistently       General Comments:  (pt also HOH )    Exercises Other Exercises Other Exercises: Supine AROM - Ankle pumps 1x12 (completed under PT supervision with frequent verbal cues),  Other Exercises: Supine AAROM - heel slides, hip Abduction, short arc quads - 1 x10, required 90% assistance from PT with frequent verbal cues (Displayed increase in pain during there-ex)    General Comments        Pertinent Vitals/Pain Pain Descriptors / Indicators: Aching Pain Intervention(s): Monitored during session (monitored based on pt's facial expression )    Home Living                      Prior Function            PT Goals (  current goals can now be found in the care plan section) Acute Rehab PT Goals Patient Stated Goal: Go to a SNF PT Goal Formulation: With patient/family Time For Goal Achievement: 02/22/16 Potential to Achieve Goals: Fair Progress towards PT goals: Not progressing toward goals - comment (Pt not progressing towards goals due to lack of participation and decreased cognitive status)    Frequency    7X/week      PT Plan Frequency needs to be updated    Co-evaluation             End of Session Equipment Utilized During Treatment: Oxygen Activity Tolerance: Patient limited by lethargy;Patient limited by pain Patient left: in bed;with bed alarm set;with call bell/phone within reach     Time: 0824-0847 PT Time Calculation (min)  (ACUTE ONLY): 23 min  Charges:                       G Codes:      Brinton Brandel 08-Mar-2016, 9:03 AM

## 2016-02-09 NOTE — Progress Notes (Signed)
Pt's hemoglobin today 7.6 from a previous 11.4. MD Pyreddy paged and made aware, no new orders this time. Will continue to monitor.

## 2016-02-09 NOTE — Progress Notes (Signed)
OT Cancellation Note  Patient Details Name: Melissa Watkins MRN: [id number] DOB: May 19, 1945   Cancelled Treatment:    Reason Eval/Treat Not Completed: Medical issues which prohibited therapy (Pt. too lethargic to alert and participate in session. Will continues to monitor and eval when appropriate.)  Harrel Carina, MS, OTR/L 02/09/2016, 1:53 PM

## 2016-02-09 NOTE — Progress Notes (Signed)
Renal Adjustment  Scr increased overnight; CrCl now 26 ml/min. Will renally adjust Keflex to 250 mg PO q12 hours.   Larene Beach, PharmD

## 2016-02-09 NOTE — Progress Notes (Signed)
Anticoagulation monitoring(Lovenox):  71 yo  female ordered Lovenox 40 mg Q24h  Filed Weights   02/07/16 0135 02/07/16 0544  Weight: 114 lb (51.7 kg) 111 lb 6.4 oz (50.5 kg)   BMI    Lab Results  Component Value Date   CREATININE 1.48 (H) 02/09/2016   CREATININE 1.24 (H) 02/08/2016   CREATININE 1.16 (H) 02/07/2016   Estimated Creatinine Clearance: 25.8 mL/min (by C-G formula based on SCr of 1.48 mg/dL (H)). Hemoglobin & Hematocrit     Component Value Date/Time   HGB 7.6 (L) 02/09/2016 0741   HCT 23.2 (L) 02/09/2016 0400     Per Protocol for Patient with estCrcl < 30 ml/min and BMI < 40, will transition to Lovenox 30  mg Q24h.

## 2016-02-09 NOTE — Progress Notes (Signed)
[facility] NAME: Melissa Watkins    MR#:  [id number]  DATE OF BIRTH:  11-15-1945  SUBJECTIVE:  Came in after fall, mechanical POD #2 Hip pain  REVIEW OF SYSTEMS:   Review of Systems  Constitutional: Negative for chills, fever and weight loss.  HENT: Negative for ear discharge, ear pain and nosebleeds.   Eyes: Negative for blurred vision, pain and discharge.  Respiratory: Negative for sputum production, shortness of breath, wheezing and stridor.   Cardiovascular: Negative for chest pain, palpitations, orthopnea and PND.  Gastrointestinal: Negative for abdominal pain, diarrhea, nausea and vomiting.  Genitourinary: Negative for frequency and urgency.  Musculoskeletal: Positive for falls and joint pain. Negative for back pain.  Neurological: Negative for sensory change, speech change, focal weakness and weakness.  Psychiatric/Behavioral: Negative for depression and hallucinations. The patient is not nervous/anxious.    Tolerating Diet:yes Tolerating PT:STR  DRUG ALLERGIES:   Allergies  Allergen Reactions  . Oxycodone-Acetaminophen Other (See Comments)    Hallucinations  . Valium [Diazepam]     VITALS:  Blood pressure (!) 150/68, pulse (!) 119, temperature 98.1 F (36.7 C), temperature source Oral, resp. rate 20, height 5' (1.524 m), weight 50.5 kg (111 lb 6.4 oz), SpO2 94 %.  PHYSICAL EXAMINATION:   Physical Exam  GENERAL:  71 y.o.-year-old patient lying in the bed with no acute distress.  EYES: Pupils equal, round, reactive to light and accommodation. No scleral icterus. Extraocular muscles intact. pallor HEENT: Head atraumatic, normocephalic. Oropharynx and nasopharynx clear.  NECK:  Supple, no jugular venous distention. No thyroid enlargement, no tenderness.  LUNGS: Normal breath sounds bilaterally, no wheezing, rales, rhonchi. No use of accessory muscles of respiration.  CARDIOVASCULAR: S1, S2 normal. No  murmurs, rubs, or gallops.  ABDOMEN: Soft, nontender, nondistended. Bowel sounds present. No organomegaly or mass.  EXTREMITIES: No cyanosis, clubbing or edema b/l.    NEUROLOGIC: Cranial nerves II through XII are intact. No focal Motor or sensory deficits b/l.   PSYCHIATRIC:  patient is alert and oriented x 3.  SKIN: No obvious rash, lesion, or ulcer.   LABORATORY PANEL:  CBC  Recent Labs Lab 02/09/16 0400 02/09/16 0741  WBC 12.2*  --   HGB 7.6* 7.6*  HCT 23.2*  --   PLT 185  --     Chemistries   Recent Labs Lab 02/07/16 0143  02/07/16 1511  02/09/16 0400  NA 139  < >  --   < > 139  K 3.0*  < >  --   < > 4.0  CL 106  < >  --   < > 109  CO2 24  < >  --   < > 21*  GLUCOSE 136*  < >  --   < > 148*  BUN 28*  < >  --   < > 85*  CREATININE 1.21*  < >  --   < > 1.48*  CALCIUM 9.0  < >  --   < > 8.4*  MG  --   --  1.8  --   --   AST 36  --   --   --   --   ALT 20  --   --   --   --   ALKPHOS 80  --   --   --   --   BILITOT 0.8  --   --   --   --   < > =  values in this interval not displayed. Cardiac Enzymes No results for input(s): TROPONINI in the last 168 hours. RADIOLOGY:  Dg Pelvis 1-2 Views  Result Date: 02/07/2016 CLINICAL DATA:  Hip fracture. EXAM: PELVIS - 1-2 VIEW COMPARISON:  02/07/2016 FINDINGS: On the side marked R for Right, we demonstrate a rasp-like margin along the femoral stem portion of a right hip prosthesis. This has serrated margins similar to a rasp. There appears to be a cerclage in place although the lateral margin of the cerclage is omitted and accordingly continuity of the cerclage or the reason the cerclage was placed is not readily visible. The stem has a flat disc femoral head margin with several plate like lmibs inferiorly, anteriorly, and posteriorly, and extends into a very faintly radiolucent femoral head component, presumably an unusual prosthesis or a sizing portion of the prosthesis. This has a hazy density without trabeculation so I do not  think it represents the original femoral head. Gas tracks within the soft tissues in the region of the implant, and overlies the pubic rami. IMPRESSION: 1. Radiographs demonstrate a rasp-like, serrated margin device in the right femur corresponding to the expected location of the hip implant. I am unsure whether this is simply an unusually serrated-margined implant or whether this is a rasp device. It appears to have a proximal disc component along the femoral head along with several fixation plate limbs into what is presumably a radiolucent prosthetic femoral head. There is a cerclage wire in place which is only partially included on today's exam. The lateral margin of the cerclage wire is not included. Electronically Signed   By: Van Clines M.D.   On: 02/07/2016 20:21   Dg Hip Port Unilat With Pelvis 1v Right  Result Date: 02/07/2016 CLINICAL DATA:  Postoperative pain. EXAM: DG HIP (WITH OR WITHOUT PELVIS) 1V PORT RIGHT COMPARISON:  RIGHT hip radiographs February 07, 2016 at 0203 hours FINDINGS: Status post RIGHT hip hemiarthroplasty with intact well-seated non cemented components. Proximal femoral cerclage wire. Osteopenia without destructive bony lesions. Linear lucency RIGHT superolateral acetabulum. RIGHT hip subcutaneous gas, soft tissue swelling and skin staples. IMPRESSION: Status post recent RIGHT hip hemiarthroplasty. Suspected nondisplaced acetabular fracture. Electronically Signed   By: Elon Alas M.D.   On: 02/07/2016 21:31   ASSESSMENT AND PLAN:   Melissa Watkins  is a 71 y.o. female with a known history of chronic back pain presented to the emergency room after she had a fall yesterday. Patient lost balance and fell on her right hip yesterday at home in the evening. Since this morning she had a lot of pain in the right hip area and she was very uncomfortable  1. Right proximal femur fracture s/p mechanical fall at home s/p surgery POD# 2 -pt denies any Cp or sob. -she has no  cardiac history. -echo 2015 showed EF 55-60% -EKg NSR and LVH  2. HTN -not on any meds ( used to be on it ) -on metoprolol in the perioperative period  3. Hypokalemia -repleted  4.Post op anemia, acute -13.0---11.4--7.6--7.6--1 unit BT--  5. Dehydration -resolved  6. DVT prophylaxis Lovenox  D/c to rehab in am if remains stable Case discussed with Care Management/Social Worker. Management plans discussed with the patient, family and they are in agreement.  CODE STATUS: full  DVT Prophylaxis: as above  TOTAL TIME TAKING CARE OF THIS PATIENT: 30 minutes.  >50% time spent on counselling and coordination of care  POSSIBLE D/C IN 1 DAYS, DEPENDING ON CLINICAL CONDITION.  Note: This dictation was prepared with Dragon dictation along with smaller phrase technology. Any transcriptional errors that result from this process are unintentional.  Roniya Tetro M.D on 02/09/2016 at 8:18 AM  Between 7am to 6pm - Pager - [phone]  After 6pm go to [email or web address] - password [email or web address] [facility]  [facility]  Office  [phone]  CC: Primary care physician; No PCP Per Patient

## 2016-02-10 DIAGNOSIS — R609 Edema, unspecified: Secondary | ICD-10-CM | POA: Diagnosis not present

## 2016-02-10 DIAGNOSIS — K449 Diaphragmatic hernia without obstruction or gangrene: Secondary | ICD-10-CM | POA: Diagnosis present

## 2016-02-10 DIAGNOSIS — Z471 Aftercare following joint replacement surgery: Secondary | ICD-10-CM | POA: Diagnosis not present

## 2016-02-10 DIAGNOSIS — B356 Tinea cruris: Secondary | ICD-10-CM | POA: Diagnosis not present

## 2016-02-10 DIAGNOSIS — M6281 Muscle weakness (generalized): Secondary | ICD-10-CM | POA: Diagnosis not present

## 2016-02-10 DIAGNOSIS — G8929 Other chronic pain: Secondary | ICD-10-CM | POA: Diagnosis present

## 2016-02-10 DIAGNOSIS — Z781 Physical restraint status: Secondary | ICD-10-CM | POA: Diagnosis not present

## 2016-02-10 DIAGNOSIS — J209 Acute bronchitis, unspecified: Secondary | ICD-10-CM | POA: Diagnosis present

## 2016-02-10 DIAGNOSIS — N1330 Unspecified hydronephrosis: Secondary | ICD-10-CM | POA: Diagnosis not present

## 2016-02-10 DIAGNOSIS — Z96641 Presence of right artificial hip joint: Secondary | ICD-10-CM | POA: Diagnosis not present

## 2016-02-10 DIAGNOSIS — J441 Chronic obstructive pulmonary disease with (acute) exacerbation: Secondary | ICD-10-CM | POA: Diagnosis present

## 2016-02-10 DIAGNOSIS — D62 Acute posthemorrhagic anemia: Secondary | ICD-10-CM | POA: Diagnosis not present

## 2016-02-10 DIAGNOSIS — M549 Dorsalgia, unspecified: Secondary | ICD-10-CM | POA: Diagnosis present

## 2016-02-10 DIAGNOSIS — D649 Anemia, unspecified: Secondary | ICD-10-CM | POA: Diagnosis not present

## 2016-02-10 DIAGNOSIS — J9601 Acute respiratory failure with hypoxia: Secondary | ICD-10-CM | POA: Diagnosis not present

## 2016-02-10 DIAGNOSIS — S72001D Fracture of unspecified part of neck of right femur, subsequent encounter for closed fracture with routine healing: Secondary | ICD-10-CM | POA: Diagnosis not present

## 2016-02-10 DIAGNOSIS — N320 Bladder-neck obstruction: Secondary | ICD-10-CM | POA: Diagnosis present

## 2016-02-10 DIAGNOSIS — N289 Disorder of kidney and ureter, unspecified: Secondary | ICD-10-CM | POA: Diagnosis not present

## 2016-02-10 DIAGNOSIS — Z7982 Long term (current) use of aspirin: Secondary | ICD-10-CM | POA: Diagnosis not present

## 2016-02-10 DIAGNOSIS — I10 Essential (primary) hypertension: Secondary | ICD-10-CM | POA: Diagnosis not present

## 2016-02-10 DIAGNOSIS — E860 Dehydration: Secondary | ICD-10-CM | POA: Diagnosis present

## 2016-02-10 DIAGNOSIS — Z9049 Acquired absence of other specified parts of digestive tract: Secondary | ICD-10-CM | POA: Diagnosis not present

## 2016-02-10 DIAGNOSIS — E871 Hypo-osmolality and hyponatremia: Secondary | ICD-10-CM | POA: Diagnosis present

## 2016-02-10 DIAGNOSIS — K922 Gastrointestinal hemorrhage, unspecified: Secondary | ICD-10-CM | POA: Diagnosis not present

## 2016-02-10 DIAGNOSIS — Z7401 Bed confinement status: Secondary | ICD-10-CM | POA: Diagnosis not present

## 2016-02-10 DIAGNOSIS — I517 Cardiomegaly: Secondary | ICD-10-CM | POA: Diagnosis not present

## 2016-02-10 DIAGNOSIS — K269 Duodenal ulcer, unspecified as acute or chronic, without hemorrhage or perforation: Secondary | ICD-10-CM | POA: Diagnosis present

## 2016-02-10 DIAGNOSIS — E876 Hypokalemia: Secondary | ICD-10-CM | POA: Diagnosis not present

## 2016-02-10 DIAGNOSIS — R918 Other nonspecific abnormal finding of lung field: Secondary | ICD-10-CM | POA: Diagnosis not present

## 2016-02-10 DIAGNOSIS — M25551 Pain in right hip: Secondary | ICD-10-CM | POA: Diagnosis not present

## 2016-02-10 DIAGNOSIS — A419 Sepsis, unspecified organism: Secondary | ICD-10-CM | POA: Diagnosis not present

## 2016-02-10 DIAGNOSIS — D508 Other iron deficiency anemias: Secondary | ICD-10-CM | POA: Diagnosis not present

## 2016-02-10 DIAGNOSIS — K209 Esophagitis, unspecified: Secondary | ICD-10-CM | POA: Diagnosis present

## 2016-02-10 DIAGNOSIS — Z79899 Other long term (current) drug therapy: Secondary | ICD-10-CM | POA: Diagnosis not present

## 2016-02-10 DIAGNOSIS — R6889 Other general symptoms and signs: Secondary | ICD-10-CM | POA: Diagnosis not present

## 2016-02-10 DIAGNOSIS — I129 Hypertensive chronic kidney disease with stage 1 through stage 4 chronic kidney disease, or unspecified chronic kidney disease: Secondary | ICD-10-CM | POA: Diagnosis present

## 2016-02-10 DIAGNOSIS — N189 Chronic kidney disease, unspecified: Secondary | ICD-10-CM | POA: Diagnosis present

## 2016-02-10 DIAGNOSIS — S7291XA Unspecified fracture of right femur, initial encounter for closed fracture: Secondary | ICD-10-CM | POA: Diagnosis not present

## 2016-02-10 DIAGNOSIS — J439 Emphysema, unspecified: Secondary | ICD-10-CM | POA: Diagnosis not present

## 2016-02-10 DIAGNOSIS — K921 Melena: Secondary | ICD-10-CM | POA: Diagnosis present

## 2016-02-10 DIAGNOSIS — D72829 Elevated white blood cell count, unspecified: Secondary | ICD-10-CM | POA: Diagnosis not present

## 2016-02-10 DIAGNOSIS — N179 Acute kidney failure, unspecified: Secondary | ICD-10-CM | POA: Diagnosis present

## 2016-02-10 DIAGNOSIS — F17210 Nicotine dependence, cigarettes, uncomplicated: Secondary | ICD-10-CM | POA: Diagnosis not present

## 2016-02-10 DIAGNOSIS — J440 Chronic obstructive pulmonary disease with acute lower respiratory infection: Secondary | ICD-10-CM | POA: Diagnosis present

## 2016-02-10 DIAGNOSIS — R2681 Unsteadiness on feet: Secondary | ICD-10-CM | POA: Diagnosis not present

## 2016-02-10 LAB — TYPE AND SCREEN
Blood Product Expiration Date: 201801132359
Blood Product Expiration Date: 201801262359
ISSUE DATE / TIME: 201801101016
ISSUE DATE / TIME: 201801101027
Unit Type and Rh: 5100
Unit Type and Rh: 5100

## 2016-02-10 LAB — CBC
HCT: 27.5 % — ABNORMAL LOW (ref 35.0–47.0)
Hemoglobin: 9.2 g/dL — ABNORMAL LOW (ref 12.0–16.0)
MCH: 30.1 pg (ref 26.0–34.0)
MCHC: 33.4 g/dL (ref 32.0–36.0)
MCV: 90.2 fL (ref 80.0–100.0)
PLATELETS: 221 10*3/uL (ref 150–440)
RBC: 3.04 MIL/uL — AB (ref 3.80–5.20)
RDW: 15.7 % — AB (ref 11.5–14.5)
WBC: 16.6 10*3/uL — ABNORMAL HIGH (ref 3.6–11.0)

## 2016-02-10 MED ORDER — CEPHALEXIN 250 MG PO CAPS: 250.0000 mg | ORAL_CAPSULE | Freq: Two times a day (BID) | ORAL | 0 refills | Status: DC

## 2016-02-10 MED ORDER — ENSURE ENLIVE PO LIQD: 237.0000 mL | Freq: Two times a day (BID) | ORAL | 12 refills | Status: DC

## 2016-02-10 MED ORDER — HYDROCODONE-ACETAMINOPHEN 7.5-325 MG PO TABS: 1.0000 | ORAL_TABLET | ORAL | 0 refills | Status: DC | PRN

## 2016-02-10 MED ORDER — ENOXAPARIN SODIUM 30 MG/0.3ML ~~LOC~~ SOLN: 30.0000 mg | SUBCUTANEOUS | 0 refills | Status: DC

## 2016-02-10 MED ORDER — FERROUS SULFATE 325 (65 FE) MG PO TABS: 325.0000 mg | ORAL_TABLET | Freq: Three times a day (TID) | ORAL | 3 refills | Status: DC

## 2016-02-10 MED ORDER — METOPROLOL TARTRATE 25 MG PO TABS: 25.0000 mg | ORAL_TABLET | Freq: Two times a day (BID) | ORAL | 2 refills | Status: DC

## 2016-02-10 NOTE — Progress Notes (Signed)
Patient is medically stable for discharge today to [facility]. Per Hilda Blades, admissions coordinator at [facility], patient can come to room 325. Social work Theatre manager sent D/C orders in [place]. Social work met with patient at bedside and explained that patient will discharge today to [facility]. Patient verbally agreed she understood. Social work Theatre manager spoke with patient's son [name], making him aware of patient's discharge today. RN will call report and arrange EMS for transport. Please re-consult if future social work needs arise. Social work Architectural technologist off.   Danie Chandler, Social Work Intern  [phone]

## 2016-02-10 NOTE — Progress Notes (Signed)
Physical Therapy Treatment Patient Details Name: Melissa Watkins MRN: [id number] DOB: Nov 23, 1945 Today's Date: 02/10/2016    History of Present Illness Pt. is a 71 y.o. female who was admitted to [facility] for a posterior approach hemiarthroplasty repair (02/07/16) of a right femoral neck fracture.    PT Comments    Pt in bed with complaints of back pain.  Moaning.  Participated in exercises as described below.  Minimal participation and at one point she did drift off to sleep.  To edge of bed with max a x 2.  Sitting edge of bed with mod a x 1 for balance.  She stood x 1 with Max a x 2 with walker.  She was able to tolerate standing for up to 45 seconds but unable to step.  She was able to weight bear well on RLE(operative)  but LLE she required assist to put her foot flat on the floor once standing.  She returned to sitting and was able to stand pivot to recliner at bedside with max a x 2 in hopes to relieve back pain.   Follow Up Recommendations  SNF     Equipment Recommendations       Recommendations for Other Services       Precautions / Restrictions Precautions Precautions: Fall;Posterior Hip Restrictions Weight Bearing Restrictions: Yes RLE Weight Bearing: Weight bearing as tolerated    Mobility  Bed Mobility Overal bed mobility: Needs Assistance Bed Mobility: Supine to Sit     Supine to sit: Max assist;+2 for safety/equipment        Transfers Overall transfer level: Needs assistance Equipment used: Rolling walker (2 wheeled) Transfers: Stand Pivot Transfers   Stand pivot transfers: +2 physical assistance;Max assist          Ambulation/Gait             General Gait Details: Did not attempt due to poor cognitive status and impaired strength    Stairs            Wheelchair Mobility    Modified Rankin (Stroke Patients Only)       Balance Overall balance assessment: Needs assistance Sitting-balance support: Bilateral upper extremity  supported Sitting balance-Leahy Scale: Fair   Postural control: Posterior lean Standing balance support: Bilateral upper extremity supported Standing balance-Leahy Scale: Poor                      Cognition Arousal/Alertness: Lethargic Behavior During Therapy: Flat affect Overall Cognitive Status: Impaired/Different from baseline Area of Impairment: Attention;Following commands       Following Commands: Follows one step commands inconsistently            Exercises Other Exercises Other Exercises: Supine AAROM - heel slides, hip Abduction, short arc quads - 1 x10, required 90% assistance from PT with frequent verbal cues    General Comments        Pertinent Vitals/Pain Pain Assessment: Faces Faces Pain Scale: Hurts whole lot Pain Descriptors / Indicators: Operative site guarding;Moaning Pain Intervention(s): Limited activity within patient's tolerance;Patient requesting pain meds-RN notified    Home Living                      Prior Function            PT Goals (current goals can now be found in the care plan section) Progress towards PT goals: Progressing toward goals    Frequency    7X/week      PT  Plan      Co-evaluation             End of Session Equipment Utilized During Treatment: Oxygen Activity Tolerance: Patient limited by lethargy;Patient limited by pain Patient left: in chair;with family/visitor present;with nursing/sitter in room;with chair alarm set     Time: 619 042 2910 PT Time Calculation (min) (ACUTE ONLY): 24 min  Charges:  $Therapeutic Exercise: 8-22 mins $Therapeutic Activity: 8-22 mins                    G Codes:      Chesley Noon 03-04-2016, 10:51 AM

## 2016-02-10 NOTE — Care Management Important Message (Signed)
Important Message  Patient Details  Name: Melissa Watkins MRN: [id number] Date of Birth: Feb 03, 1945   Medicare Important Message Given:  Yes    Jolly Mango, RN 02/10/2016, 10:12 AM

## 2016-02-10 NOTE — Clinical Social Work Placement (Signed)
   CLINICAL SOCIAL WORK PLACEMENT  NOTE  Date:  02/10/2016  Patient Details  Name: Melissa Watkins MRN: [id number] Date of Birth: 04-03-1945  Clinical Social Work is seeking post-discharge placement for this patient at the [place] level of care (*CSW will initial, date and re-position this form in  chart as items are completed):  Yes   Patient/family provided with [place] Work Department's list of facilities offering this level of care within the geographic area requested by the patient (or if unable, by the patient's family).  Yes   Patient/family informed of their freedom to choose among providers that offer the needed level of care, that participate in Medicare, Medicaid or managed care program needed by the patient, have an available bed and are willing to accept the patient.  Yes   Patient/family informed of 's ownership interest in [facility] and [facility], as well as of the fact that they are under no obligation to receive care at these facilities.  PASRR submitted to EDS on 02/07/16     PASRR number received on 02/07/16     Existing PASRR number confirmed on       FL2 transmitted to all facilities in geographic area requested by pt/family on 02/07/16     FL2 transmitted to all facilities within larger geographic area on       Patient informed that his/her managed care company has contracts with or will negotiate with certain facilities, including the following:        Yes   Patient/family informed of bed offers received.  Patient chooses bed at  [facility])     Physician recommends and patient chooses bed at      Patient to be transferred to  [facility]) on 02/10/16.  Patient to be transferred to facility by  [facility])     Patient family notified on 02/10/16 of transfer.  Name of family member notified:   (Patient's son Melissa Watkins is aware of patient's discharge today.)     PHYSICIAN      Additional Comment:    _______________________________________________ Danie Chandler, Student-Social Work 02/10/2016, 9:33 AM

## 2016-02-10 NOTE — Discharge Summary (Signed)
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 Back pain, chronic     HOSPITAL COURSE:   PhyllisLeggateis a 71 y.o.femalewith a known history of chronic back pain presented to the emergency room after she had a fall yesterday.Patient lost balance and fell on her right hip yesterday at home in the evening.Since this morning she had a lot of pain in the right hip area and she was very uncomfortable  1.Right proximal femur fracture s/p mechanical fall at home s/p surgery POD# 3 -pt denies any Cp or sob. -she has no cardiac history. -echo 2015 showed EF 55-60% -EKg NSR and LVH  2.HTN -not on any meds ( used to be on it ) -on metoprolol in the perioperative period  3.Hypokalemia -repleted  4.Post op anemia, acute -13.0---11.4--7.6--7.6--1 unit BT--9.2  5.chronic djd  Thoracolumbar spine with old compression fractures -prn pain meds  6.DVT prophylaxis with lovenox  7. E coli UTi -on po keflex  D/c home D/w dr Raliegh Ip CONSULTS OBTAINED:    DRUG ALLERGIES:   Allergies  Allergen Reactions  . Oxycodone-Acetaminophen Other (See Comments)    Hallucinations  . Valium [Diazepam]     DISCHARGE MEDICATIONS:   Current Discharge Medication List    START taking these medications   Details  cephALEXin (KEFLEX) 250 MG capsule Take 1 capsule (250 mg total) by mouth every 12 (twelve) hours. Qty: 10 capsule, Refills: 0     enoxaparin (LOVENOX) 30 MG/0.3ML injection Inject 0.3 mLs (30 mg total) into the skin daily. Qty: 14 Syringe, Refills: 0    feeding supplement, ENSURE ENLIVE, (ENSURE ENLIVE) LIQD Take 237 mLs by mouth 2 (two) times daily between meals. Qty: 237 mL, Refills: 12    ferrous sulfate 325 (65 FE) MG tablet Take 1 tablet (325 mg total) by mouth 3 (three) times daily after meals. Qty: 90 tablet, Refills: 3    HYDROcodone-acetaminophen (NORCO) 7.5-325 MG tablet Take 1-2 tablets by mouth every 4 (four) hours as needed for moderate pain. Qty: 30 tablet, Refills: 0    metoprolol tartrate (LOPRESSOR) 25 MG tablet Take 1 tablet (25 mg total) by mouth 2 (two) times daily. Qty: 60 tablet, Refills: 2      CONTINUE these medications which have NOT CHANGED   Details  acetaminophen (TYLENOL) 500 MG tablet Take 500 mg by mouth as needed.    aspirin 81 MG tablet Take 81 mg by mouth daily.        If you experience worsening of your admission symptoms, develop shortness of breath, life threatening emergency, suicidal or homicidal thoughts you must seek medical attention immediately by calling 911 or calling your MD immediately  if symptoms less severe.  You Must read complete instructions/literature along with all the possible adverse reactions/side effects for all the Medicines you take and that have been prescribed to you. Take any new Medicines after you have completely understood and accept all the possible adverse reactions/side effects.  Please note  You were cared for by a hospitalist during your hospital stay. If you have any questions about your discharge medications or the care you received while you were in the hospital after you are discharged, you can call the unit and asked to speak with the hospitalist on call if the hospitalist that took care of you is not available. Once you are discharged, your primary care physician will handle any further medical issues. Please note that NO REFILLS  for any discharge medications will be authorized once you are discharged, as it is imperative that you return to your primary care physician (or establish a relationship with a primary care physician if you do not have one) for your aftercare needs so that they can reassess your need for medications and monitor your lab values. Today   SUBJECTIVE   No new issues per Rn  VITAL SIGNS:  Blood pressure (!) 171/91, pulse (!) 108, temperature 97.4 F (36.3 C), temperature source Oral, resp. rate (!) 24, height 5' (1.524 m), weight 50.5 kg (111 lb 6.4 oz), SpO2 98 %.  I/O:   Intake/Output Summary (Last 24 hours) at 02/10/16 0829 Last data filed at 02/09/16 1309  Gross per 24 hour  Intake              310 ml  Output                0 ml  Net              310 ml    PHYSICAL EXAMINATION:  GENERAL:  71 y.o.-year-old patient lying in the bed with no acute distress. Appears older than her age EYES: Pupils equal, round, reactive to light and accommodation. No scleral icterus. Extraocular muscles intact.  HEENT: Head atraumatic, normocephalic. Oropharynx and nasopharynx clear.  NECK:  Supple, no jugular venous distention. No thyroid enlargement, no tenderness.  LUNGS: Normal breath sounds bilaterally, no wheezing, rales,rhonchi or crepitation. No use of accessory muscles of respiration.  CARDIOVASCULAR: S1, S2 normal. No murmurs, rubs, or gallops.  ABDOMEN: Soft, non-tender, non-distended. Bowel sounds present. No organomegaly or mass.  EXTREMITIES: No pedal edema, cyanosis, or clubbing.  NEUROLOGIC: Cranial nerves II through XII are intact. Muscle strength 4/5 in all extremities. Sensation intact. Gait not checked.  PSYCHIATRIC: The patient is alert and oriented x 2  SKIN: No obvious rash, lesion, or ulcer.   DATA REVIEW:   CBC   Recent Labs Lab 02/10/16 0401  WBC 16.6*  HGB 9.2*  HCT 27.5*  PLT 221    Chemistries   Recent Labs Lab 02/07/16 0143  02/07/16 1511  02/09/16 0400   NA 139  < >  --   < > 139  K 3.0*  < >  --   < > 4.0  CL 106  < >  --   < > 109  CO2 24  < >  --   < > 21*  GLUCOSE 136*  < >  --   < > 148*  BUN 28*  < >  --   < > 85*  CREATININE 1.21*  < >  --   < > 1.48*  CALCIUM 9.0  < >  --   < > 8.4*  MG  --   --  1.8  --   --   AST 36  --   --   --   --   ALT 20  --   --   --   --  ALKPHOS 80  --   --   --   --   BILITOT 0.8  --   --   --   --   < > = values in this interval not displayed.  Microbiology Results   Recent Results (from the past 240 hour(s))  Urine culture     Status: Abnormal   Collection Time: 02/07/16  3:30 AM  Result Value Ref Range Status   Specimen Description URINE, RANDOM  Final   Special Requests NONE  Final   Culture >=100,000 COLONIES/mL ESCHERICHIA COLI (A)  Final   Report Status 02/09/2016 FINAL  Final   Organism ID, Bacteria ESCHERICHIA COLI (A)  Final      Susceptibility   Escherichia coli - MIC*    AMPICILLIN 8 SENSITIVE Sensitive     CEFAZOLIN <=4 SENSITIVE Sensitive     CEFTRIAXONE <=1 SENSITIVE Sensitive     CIPROFLOXACIN <=0.25 SENSITIVE Sensitive     GENTAMICIN <=1 SENSITIVE Sensitive     IMIPENEM <=0.25 SENSITIVE Sensitive     NITROFURANTOIN <=16 SENSITIVE Sensitive     TRIMETH/SULFA <=20 SENSITIVE Sensitive     AMPICILLIN/SULBACTAM <=2 SENSITIVE Sensitive     PIP/TAZO <=4 SENSITIVE Sensitive     Extended ESBL NEGATIVE Sensitive     * >=100,000 COLONIES/mL ESCHERICHIA COLI    RADIOLOGY:  Dg Cervical Spine Complete  Result Date: 02/09/2016 CLINICAL DATA:  71 year old with chronic pain in the cervical, thoracic and lumbar spine. Recent hip surgery. No reported acute injury. EXAM: CERVICAL SPINE - COMPLETE 4+ VIEW COMPARISON:  None. FINDINGS: The bones are demineralized. The alignment is normal. No evidence of acute fracture. There is multilevel facet hypertrophy. The disc spaces are relatively preserved. Oblique views are suboptimal. There is possible osseous foraminal narrowing. IMPRESSION:  Cervical spondylosis with possible osseous foraminal narrowing, suboptimally evaluated. No acute osseous findings. Electronically Signed   By: Richardean Sale M.D.   On: 02/09/2016 10:33   Dg Thoracic Spine 2 View  Result Date: 02/09/2016 CLINICAL DATA:  71 year old with chronic pain in the cervical, thoracic and lumbar spine. Recent hip surgery. No reported acute injury. EXAM: THORACIC SPINE 2 VIEWS COMPARISON:  Limited correlation made with one-view chest 02/07/2016. FINDINGS: The bones are diffusely demineralized. There are numerous age-indeterminate compression deformities, involving the T5, T7, T8, T11, T12 and L1 vertebral bodies. The L1 fracture is most severe. The alignment is near anatomic with a mild scoliosis. No widening of the interpedicular distance identified. IMPRESSION: Multiple age-indeterminate thoracic compression deformities. Consider MRI for further aging if there is concern of acute fracture. Electronically Signed   By: Richardean Sale M.D.   On: 02/09/2016 10:43   Dg Lumbar Spine 2-3 Views  Result Date: 02/09/2016 CLINICAL DATA:  71 year old with chronic pain in the cervical, thoracic and lumbar spine. Recent hip surgery. No reported acute injury. EXAM: LUMBAR SPINE - 2-3 VIEW COMPARISON:  None. FINDINGS: Five lumbar type vertebral bodies. The bones are diffusely demineralized. There are multiple lower thoracic compression deformities. In addition, there are superior endplate compression deformities at L1 and L4. The L4 fracture results in greater than 50% loss vertebral body height anteriorly. No widening of the interpedicular distance or significant osseous retropulsion identified. Mild disc space narrowing is present at L5-S1. There is mild facet disease throughout the lumbar spine. Previous right hip arthroplasty noted. Lucency projecting over the right pubic bones could reflect a hernia in the appropriate clinical context. IMPRESSION: 1. Multiple thoracolumbar compression  deformities, age indeterminate. Lumbar involvement  is most severe at L4. These could be further evaluated with MRI if clinically warranted. 2. Lucency projecting over the right pubic bones, potentially a hernia. Correlate clinically. Electronically Signed   By: Richardean Sale M.D.   On: 02/09/2016 10:46     Management plans discussed with the patient, family and they are in agreement.  CODE STATUS:  Code Status History    Date Active Date Inactive Code Status Order ID Comments User Context   02/07/2016  2:41 PM 02/07/2016  9:54 PM Full Code [id number]  Thornton Park, MD Inpatient   02/07/2016  5:42 AM 02/07/2016  2:41 PM Full Code [id number]  Saundra Shelling, MD Inpatient      TOTAL TIME TAKING CARE OF THIS PATIENT:40 minutes.    Valdemar Mcclenahan M.D on 02/10/2016 at 8:29 AM  Between 7am to 6pm - Pager - [phone] After 6pm go to [email or web address] - password [email or web address] [facility]  [facility]  Office  [phone]  CC: Primary care physician; No PCP Per Patient

## 2016-02-15 DIAGNOSIS — D508 Other iron deficiency anemias: Secondary | ICD-10-CM | POA: Diagnosis not present

## 2016-02-15 DIAGNOSIS — B356 Tinea cruris: Secondary | ICD-10-CM | POA: Diagnosis not present

## 2016-02-21 ENCOUNTER — Inpatient Hospital Stay: Payer: Medicare Other

## 2016-02-21 ENCOUNTER — Inpatient Hospital Stay
Admission: EM | Admit: 2016-02-21 | Discharge: 2016-02-25 | DRG: 871 | Disposition: A | Discharge status: Skilled Nursing Facility | Payer: Medicare Other | Attending: Internal Medicine | Admitting: Internal Medicine

## 2016-02-21 ENCOUNTER — Encounter: Payer: Self-pay | Admitting: Emergency Medicine

## 2016-02-21 ENCOUNTER — Emergency Department: Payer: Medicare Other

## 2016-02-21 DIAGNOSIS — Z9049 Acquired absence of other specified parts of digestive tract: Secondary | ICD-10-CM

## 2016-02-21 DIAGNOSIS — M84459A Pathological fracture, hip, unspecified, initial encounter for fracture: Secondary | ICD-10-CM | POA: Diagnosis not present

## 2016-02-21 DIAGNOSIS — I129 Hypertensive chronic kidney disease with stage 1 through stage 4 chronic kidney disease, or unspecified chronic kidney disease: Secondary | ICD-10-CM | POA: Diagnosis present

## 2016-02-21 DIAGNOSIS — J440 Chronic obstructive pulmonary disease with acute lower respiratory infection: Secondary | ICD-10-CM | POA: Diagnosis present

## 2016-02-21 DIAGNOSIS — J439 Emphysema, unspecified: Secondary | ICD-10-CM | POA: Diagnosis not present

## 2016-02-21 DIAGNOSIS — E871 Hypo-osmolality and hyponatremia: Secondary | ICD-10-CM | POA: Diagnosis present

## 2016-02-21 DIAGNOSIS — M549 Dorsalgia, unspecified: Secondary | ICD-10-CM | POA: Diagnosis present

## 2016-02-21 DIAGNOSIS — M6281 Muscle weakness (generalized): Secondary | ICD-10-CM | POA: Diagnosis not present

## 2016-02-21 DIAGNOSIS — Z9889 Other specified postprocedural states: Secondary | ICD-10-CM

## 2016-02-21 DIAGNOSIS — Z960 Presence of urogenital implants: Secondary | ICD-10-CM

## 2016-02-21 DIAGNOSIS — K921 Melena: Secondary | ICD-10-CM | POA: Diagnosis not present

## 2016-02-21 DIAGNOSIS — D649 Anemia, unspecified: Secondary | ICD-10-CM

## 2016-02-21 DIAGNOSIS — R338 Other retention of urine: Secondary | ICD-10-CM | POA: Diagnosis not present

## 2016-02-21 DIAGNOSIS — Z79899 Other long term (current) drug therapy: Secondary | ICD-10-CM

## 2016-02-21 DIAGNOSIS — J9601 Acute respiratory failure with hypoxia: Secondary | ICD-10-CM | POA: Diagnosis not present

## 2016-02-21 DIAGNOSIS — M25559 Pain in unspecified hip: Secondary | ICD-10-CM | POA: Insufficient documentation

## 2016-02-21 DIAGNOSIS — K228 Other specified diseases of esophagus: Secondary | ICD-10-CM | POA: Diagnosis not present

## 2016-02-21 DIAGNOSIS — R339 Retention of urine, unspecified: Secondary | ICD-10-CM | POA: Diagnosis not present

## 2016-02-21 DIAGNOSIS — K922 Gastrointestinal hemorrhage, unspecified: Secondary | ICD-10-CM | POA: Diagnosis not present

## 2016-02-21 DIAGNOSIS — Z978 Presence of other specified devices: Secondary | ICD-10-CM

## 2016-02-21 DIAGNOSIS — Z96641 Presence of right artificial hip joint: Secondary | ICD-10-CM | POA: Diagnosis present

## 2016-02-21 DIAGNOSIS — N189 Chronic kidney disease, unspecified: Secondary | ICD-10-CM | POA: Diagnosis present

## 2016-02-21 DIAGNOSIS — Z781 Physical restraint status: Secondary | ICD-10-CM | POA: Diagnosis not present

## 2016-02-21 DIAGNOSIS — R2681 Unsteadiness on feet: Secondary | ICD-10-CM | POA: Diagnosis not present

## 2016-02-21 DIAGNOSIS — D62 Acute posthemorrhagic anemia: Secondary | ICD-10-CM | POA: Diagnosis present

## 2016-02-21 DIAGNOSIS — K269 Duodenal ulcer, unspecified as acute or chronic, without hemorrhage or perforation: Secondary | ICD-10-CM | POA: Diagnosis present

## 2016-02-21 DIAGNOSIS — Z7982 Long term (current) use of aspirin: Secondary | ICD-10-CM | POA: Diagnosis not present

## 2016-02-21 DIAGNOSIS — R2241 Localized swelling, mass and lump, right lower limb: Secondary | ICD-10-CM | POA: Diagnosis not present

## 2016-02-21 DIAGNOSIS — I517 Cardiomegaly: Secondary | ICD-10-CM | POA: Diagnosis not present

## 2016-02-21 DIAGNOSIS — K449 Diaphragmatic hernia without obstruction or gangrene: Secondary | ICD-10-CM | POA: Diagnosis present

## 2016-02-21 DIAGNOSIS — E860 Dehydration: Secondary | ICD-10-CM | POA: Diagnosis present

## 2016-02-21 DIAGNOSIS — Z79891 Long term (current) use of opiate analgesic: Secondary | ICD-10-CM

## 2016-02-21 DIAGNOSIS — Z9289 Personal history of other medical treatment: Secondary | ICD-10-CM

## 2016-02-21 DIAGNOSIS — M25551 Pain in right hip: Secondary | ICD-10-CM | POA: Diagnosis not present

## 2016-02-21 DIAGNOSIS — N179 Acute kidney failure, unspecified: Secondary | ICD-10-CM | POA: Diagnosis present

## 2016-02-21 DIAGNOSIS — J441 Chronic obstructive pulmonary disease with (acute) exacerbation: Secondary | ICD-10-CM | POA: Diagnosis present

## 2016-02-21 DIAGNOSIS — F17210 Nicotine dependence, cigarettes, uncomplicated: Secondary | ICD-10-CM | POA: Diagnosis present

## 2016-02-21 DIAGNOSIS — Z466 Encounter for fitting and adjustment of urinary device: Secondary | ICD-10-CM | POA: Diagnosis not present

## 2016-02-21 DIAGNOSIS — R609 Edema, unspecified: Secondary | ICD-10-CM | POA: Diagnosis not present

## 2016-02-21 DIAGNOSIS — N289 Disorder of kidney and ureter, unspecified: Secondary | ICD-10-CM | POA: Diagnosis not present

## 2016-02-21 DIAGNOSIS — R4189 Other symptoms and signs involving cognitive functions and awareness: Secondary | ICD-10-CM | POA: Diagnosis not present

## 2016-02-21 DIAGNOSIS — Z7401 Bed confinement status: Secondary | ICD-10-CM | POA: Diagnosis not present

## 2016-02-21 DIAGNOSIS — G8929 Other chronic pain: Secondary | ICD-10-CM | POA: Diagnosis present

## 2016-02-21 DIAGNOSIS — K219 Gastro-esophageal reflux disease without esophagitis: Secondary | ICD-10-CM | POA: Diagnosis not present

## 2016-02-21 DIAGNOSIS — K209 Esophagitis, unspecified without bleeding: Secondary | ICD-10-CM

## 2016-02-21 DIAGNOSIS — A419 Sepsis, unspecified organism: Secondary | ICD-10-CM | POA: Diagnosis not present

## 2016-02-21 DIAGNOSIS — N1330 Unspecified hydronephrosis: Secondary | ICD-10-CM | POA: Diagnosis not present

## 2016-02-21 DIAGNOSIS — N320 Bladder-neck obstruction: Secondary | ICD-10-CM | POA: Diagnosis present

## 2016-02-21 DIAGNOSIS — J189 Pneumonia, unspecified organism: Secondary | ICD-10-CM | POA: Diagnosis not present

## 2016-02-21 DIAGNOSIS — R269 Unspecified abnormalities of gait and mobility: Secondary | ICD-10-CM | POA: Diagnosis not present

## 2016-02-21 DIAGNOSIS — J209 Acute bronchitis, unspecified: Secondary | ICD-10-CM | POA: Diagnosis not present

## 2016-02-21 DIAGNOSIS — Z791 Long term (current) use of non-steroidal anti-inflammatories (NSAID): Secondary | ICD-10-CM | POA: Diagnosis not present

## 2016-02-21 DIAGNOSIS — R2242 Localized swelling, mass and lump, left lower limb: Secondary | ICD-10-CM | POA: Diagnosis not present

## 2016-02-21 LAB — INFLUENZA PANEL BY PCR (TYPE A & B)
Influenza A By PCR: NEGATIVE
Influenza B By PCR: NEGATIVE

## 2016-02-21 LAB — COMPREHENSIVE METABOLIC PANEL
ALT: 11 U/L — AB (ref 14–54)
ANION GAP: 12 (ref 5–15)
AST: 28 U/L (ref 15–41)
Albumin: 2.6 g/dL — ABNORMAL LOW (ref 3.5–5.0)
Alkaline Phosphatase: 92 U/L (ref 38–126)
BUN: 86 mg/dL — ABNORMAL HIGH (ref 6–20)
CALCIUM: 8 mg/dL — AB (ref 8.9–10.3)
CO2: 19 mmol/L — ABNORMAL LOW (ref 22–32)
Chloride: 98 mmol/L — ABNORMAL LOW (ref 101–111)
Creatinine, Ser: 1.83 mg/dL — ABNORMAL HIGH (ref 0.44–1.00)
GFR, EST AFRICAN AMERICAN: 31 mL/min — AB (ref >60)
GFR, EST NON AFRICAN AMERICAN: 27 mL/min — AB (ref >60)
Glucose, Bld: 118 mg/dL — ABNORMAL HIGH (ref 65–99)
POTASSIUM: 4.8 mmol/L (ref 3.5–5.1)
SODIUM: 129 mmol/L — AB (ref 135–145)
TOTAL PROTEIN: 6.6 g/dL (ref 6.5–8.1)
Total Bilirubin: 0.3 mg/dL (ref 0.3–1.2)

## 2016-02-21 LAB — LACTIC ACID, PLASMA: Lactic Acid, Venous: 1.4 mmol/L (ref 0.5–1.9)

## 2016-02-21 LAB — PROCALCITONIN: PROCALCITONIN: 0.58 ng/mL

## 2016-02-21 LAB — URINALYSIS, ROUTINE W REFLEX MICROSCOPIC
BILIRUBIN URINE: NEGATIVE
Glucose, UA: NEGATIVE mg/dL
HGB URINE DIPSTICK: NEGATIVE
KETONES UR: NEGATIVE mg/dL
NITRITE: NEGATIVE
PH: 5 (ref 5.0–8.0)
Protein, ur: NEGATIVE mg/dL
SPECIFIC GRAVITY, URINE: 1.014 (ref 1.005–1.030)

## 2016-02-21 LAB — CBC WITH DIFFERENTIAL/PLATELET
BASOS ABS: 0.1 10*3/uL (ref 0–0.1)
BASOS PCT: 0 %
Eosinophils Absolute: 0 10*3/uL (ref 0–0.7)
Eosinophils Relative: 0 %
HEMATOCRIT: 23.8 % — AB (ref 35.0–47.0)
Hemoglobin: 7.7 g/dL — ABNORMAL LOW (ref 12.0–16.0)
LYMPHS PCT: 3 %
Lymphs Abs: 0.7 10*3/uL — ABNORMAL LOW (ref 1.0–3.6)
MCH: 29.7 pg (ref 26.0–34.0)
MCHC: 32.1 g/dL (ref 32.0–36.0)
MCV: 92.4 fL (ref 80.0–100.0)
MONO ABS: 1.9 10*3/uL — AB (ref 0.2–0.9)
Monocytes Relative: 7 %
NEUTROS ABS: 24.1 10*3/uL — AB (ref 1.4–6.5)
Neutrophils Relative %: 90 %
PLATELETS: 579 10*3/uL — AB (ref 150–440)
RBC: 2.58 MIL/uL — AB (ref 3.80–5.20)
RDW: 16.5 % — AB (ref 11.5–14.5)
WBC: 26.7 10*3/uL — AB (ref 3.6–11.0)

## 2016-02-21 LAB — PREPARE RBC (CROSSMATCH)

## 2016-02-21 MED ORDER — PANTOPRAZOLE SODIUM 40 MG IV SOLR
40.0000 mg | Freq: Two times a day (BID) | INTRAVENOUS | Status: DC
  Administered 2016-02-21 – 2016-02-25 (×8): 40 mg via INTRAVENOUS
  Filled 2016-02-21 (×8): qty 40

## 2016-02-21 MED ORDER — SODIUM CHLORIDE 0.9 % IV SOLN
Freq: Once | INTRAVENOUS | Status: AC
  Administered 2016-02-21: 10 mL/h via INTRAVENOUS

## 2016-02-21 MED ORDER — ENSURE ENLIVE PO LIQD
237.0000 mL | Freq: Two times a day (BID) | ORAL | Status: DC
  Administered 2016-02-22 – 2016-02-25 (×6): 237 mL via ORAL

## 2016-02-21 MED ORDER — ONDANSETRON HCL 4 MG PO TABS: 4.0000 mg | ORAL_TABLET | Freq: Four times a day (QID) | ORAL | Status: DC | PRN

## 2016-02-21 MED ORDER — SODIUM CHLORIDE 0.9% FLUSH
3.0000 mL | Freq: Two times a day (BID) | INTRAVENOUS | Status: DC
  Administered 2016-02-21 – 2016-02-25 (×8): 3 mL via INTRAVENOUS

## 2016-02-21 MED ORDER — CEFEPIME-DEXTROSE 2 GM/50ML IV SOLR
2.0000 g | INTRAVENOUS | Status: AC
  Administered 2016-02-21: 2 g via INTRAVENOUS
  Filled 2016-02-21: qty 50

## 2016-02-21 MED ORDER — SODIUM CHLORIDE 0.9 % IV BOLUS (SEPSIS)
500.0000 mL | Freq: Once | INTRAVENOUS | Status: AC
  Administered 2016-02-21: 500 mL via INTRAVENOUS

## 2016-02-21 MED ORDER — HYDROCODONE-ACETAMINOPHEN 7.5-325 MG PO TABS
1.0000 | ORAL_TABLET | ORAL | Status: DC | PRN
  Administered 2016-02-22 – 2016-02-25 (×6): 1 via ORAL
  Filled 2016-02-21 (×7): qty 1

## 2016-02-21 MED ORDER — VANCOMYCIN HCL IN DEXTROSE 1-5 GM/200ML-% IV SOLN
1000.0000 mg | INTRAVENOUS | Status: AC
  Administered 2016-02-21: 1000 mg via INTRAVENOUS
  Filled 2016-02-21: qty 200

## 2016-02-21 MED ORDER — SODIUM CHLORIDE 0.9% FLUSH
3.0000 mL | Freq: Two times a day (BID) | INTRAVENOUS | Status: DC
  Administered 2016-02-21 – 2016-02-24 (×6): 3 mL via INTRAVENOUS

## 2016-02-21 MED ORDER — METOPROLOL TARTRATE 25 MG PO TABS
25.0000 mg | ORAL_TABLET | Freq: Two times a day (BID) | ORAL | Status: DC
  Administered 2016-02-21 – 2016-02-25 (×8): 25 mg via ORAL
  Filled 2016-02-21 (×9): qty 1

## 2016-02-21 MED ORDER — SODIUM CHLORIDE 0.9% FLUSH: 3.0000 mL | INTRAVENOUS | Status: DC | PRN

## 2016-02-21 MED ORDER — ACETAMINOPHEN 500 MG PO TABS
500.0000 mg | ORAL_TABLET | Freq: Four times a day (QID) | ORAL | Status: DC | PRN
  Administered 2016-02-23: 500 mg via ORAL
  Filled 2016-02-21: qty 1

## 2016-02-21 MED ORDER — CEFEPIME-DEXTROSE 1 GM/50ML IV SOLR
1.0000 g | INTRAVENOUS | Status: DC
  Filled 2016-02-21: qty 50

## 2016-02-21 MED ORDER — SODIUM CHLORIDE 0.9 % IV SOLN: INTRAVENOUS | Status: DC

## 2016-02-21 MED ORDER — SODIUM CHLORIDE 0.9 % IV SOLN
250.0000 mL | INTRAVENOUS | Status: DC | PRN
  Administered 2016-02-24: 11:00:00 via INTRAVENOUS

## 2016-02-21 MED ORDER — VANCOMYCIN HCL IN DEXTROSE 750-5 MG/150ML-% IV SOLN: 750.0000 mg | INTRAVENOUS | Status: DC

## 2016-02-21 MED ORDER — ONDANSETRON HCL 4 MG/2ML IJ SOLN
4.0000 mg | Freq: Four times a day (QID) | INTRAMUSCULAR | Status: DC | PRN
  Administered 2016-02-24: 4 mg via INTRAVENOUS

## 2016-02-21 NOTE — Progress Notes (Signed)
Pharmacy Antibiotic Note  Melissa Watkins is a 71 y.o. female  s/p hip fracture repair 1/8 admitted on 1/22/2018with pneumonia and sepsis.  Pharmacy has been consulted for cefepime and vancomycin dosing.  Plan: Vancomycin 1000 mg iv once then 750 mg iv q 36 hours. Will adjust dosing and/or order levels as clinically indicated. Goal trough 15-20 mcg/ml.   Cefepime 2 g iv once followed by 1 g iv q 24 hours.   Height: 5' (152.4 cm) Weight: 111 lb (50.3 kg) IBW/kg (Calculated) : 45.5  Temp (24hrs), Avg:97.5 F (36.4 C), Min:97.5 F (36.4 C), Max:97.5 F (36.4 C)   Recent Labs Lab 02/21/16 1341 02/21/16 1346  WBC 26.7*  --   CREATININE 1.83*  --   LATICACIDVEN  --  1.4    Estimated Creatinine Clearance: 20.8 mL/min (by C-G formula based on SCr of 1.83 mg/dL (H)).    Allergies  Allergen Reactions  . Oxycodone-Acetaminophen Other (See Comments)    Hallucinations  . Valium [Diazepam]     Antimicrobials this admission: vancomycin 1/22 >>  cefepime 1/22 >>   Dose adjustments this admission:   Microbiology results: 1/22 BCx: pending 1/22 UCx: pending   MRSA PCR: pending  Thank you for allowing pharmacy to be a part of this patient's care.  Napoleon Form 02/21/2016 7:38 PM

## 2016-02-21 NOTE — ED Triage Notes (Signed)
Arrives via EMS  Sent from SNF for ED evaluation due to elevated WBC (27.6) -- currently being treated for pneumonia with levaquin.  HGB 8.2.

## 2016-02-21 NOTE — ED Provider Notes (Signed)
[facility] Emergency Department Provider Note    First MD Initiated Contact with Patient 02/21/16 1348     (approximate)  I have reviewed the triage vital signs and the nursing notes.   HISTORY  Chief Complaint Code Sepsis    HPI Melissa Watkins is a 71 y.o. female who presents from skilled nursing facility roughly week and a half status post admission to hospital for fall with right hip fracture receiving total hip arthroplasty. She is discharged after being started on antibiotics for pneumonia Levaquin. Patient has had improvement in her hip pain. No erythema or rashes. As working well with physical therapy but has been having low-grade temperature, chills, shortness of breath and productive cough despite antibiotics. Blood work was checked and showed a leukocytosis to 20,000. Due to worsening condition patient was brought to the ER for further evaluation and management. On arrival patient states she does feel weak and more short of breath than baseline. Has been having a dry cough.   Past Medical History:  Diagnosis Date  . Back pain, chronic    Family History  Problem Relation Age of Onset  . Asthma Son    Past Surgical History:  Procedure Laterality Date  . CHOLECYSTECTOMY    . COLONOSCOPY    . HIP ARTHROPLASTY Right 02/07/2016   Procedure: ARTHROPLASTY BIPOLAR HIP (HEMIARTHROPLASTY);  Surgeon: Thornton Park, MD;  Location: [facility] ORS;  Service: Orthopedics;  Laterality: Right;  . OOPHORECTOMY     Patient Active Problem List   Diagnosis Date Noted  . Hip fracture ([place]) 02/07/2016  . CVA (cerebral vascular accident) ([place]) 07/17/2013  . Palpitations 07/17/2013      Prior to Admission medications   Medication Sig Start Date End Date Taking? Authorizing Provider  acetaminophen (TYLENOL) 500 MG tablet Take 500 mg by mouth every 12 (twelve) hours as needed.    Yes Historical Provider, MD  Acidophilus Lactobacillus CAPS Take 1 capsule by mouth  daily. For 10 days   Yes Historical Provider, MD  aspirin 81 MG tablet Take 81 mg by mouth daily.   Yes Historical Provider, MD  enoxaparin (LOVENOX) 30 MG/0.3ML injection Inject 0.3 mLs (30 mg total) into the skin daily. 02/10/16  Yes Fritzi Mandes, MD  feeding supplement, ENSURE ENLIVE, (ENSURE ENLIVE) LIQD Take 237 mLs by mouth 2 (two) times daily between meals. 02/10/16  Yes Fritzi Mandes, MD  ferrous sulfate 325 (65 FE) MG tablet Take 1 tablet (325 mg total) by mouth 3 (three) times daily after meals. 02/10/16  Yes Fritzi Mandes, MD  HYDROcodone-acetaminophen (NORCO) 7.5-325 MG tablet Take 1-2 tablets by mouth every 4 (four) hours as needed for moderate pain. Patient taking differently: Take 1 tablet by mouth every 4 (four) hours as needed for moderate pain.  02/10/16  Yes Fritzi Mandes, MD  levofloxacin (LEVAQUIN) 500 MG tablet Take 500 mg by mouth daily. 02/17/16 02/23/16 Yes Historical Provider, MD  metoprolol tartrate (LOPRESSOR) 25 MG tablet Take 1 tablet (25 mg total) by mouth 2 (two) times daily. 02/10/16  Yes Fritzi Mandes, MD  cephALEXin (KEFLEX) 250 MG capsule Take 1 capsule (250 mg total) by mouth every 12 (twelve) hours. Patient not taking: Reported on 02/21/2016 02/10/16   Fritzi Mandes, MD    Allergies Oxycodone-acetaminophen and Valium [diazepam]    Social History Social History  Substance Use Topics  . Smoking status: Current Every Day Smoker    Packs/day: 1.00    Years: 50.00    Types: Cigarettes  . Smokeless  tobacco: Never Used  . Alcohol use No     Comment: occasional    Review of Systems Patient denies headaches, rhinorrhea, blurry vision, numbness, shortness of breath, chest pain, edema, cough, abdominal pain, nausea, vomiting, diarrhea, dysuria, fevers, rashes or hallucinations unless otherwise stated above in HPI. ____________________________________________   PHYSICAL EXAM:  VITAL SIGNS: Vitals:   02/21/16 1630 02/21/16 1645  BP: (!) 149/67 135/63  Pulse: 87 80  Resp: 19  15  Temp:      Constitutional: Alert and oriented. Elderly and ill appearing but in no acute distress. Eyes: Conjunctivae are normal. PERRL. EOMI. Head: Atraumatic. Nose: No congestion/rhinnorhea. Mouth/Throat: Mucous membranes are moist.  Oropharynx non-erythematous. Neck: No stridor. Painless ROM. No cervical spine tenderness to palpation Hematological/Lymphatic/Immunilogical: No cervical lymphadenopathy. Cardiovascular: Normal rate, regular rhythm. Grossly normal heart sounds.  Good peripheral circulation. Respiratory: mild tachypnea, diffuse rhonchorus breathsounds. Gastrointestinal: Soft and nontender. No distention. No abdominal bruits. No CVA tenderness.  + guaiac stool Musculoskeletal: No lower extremity tenderness nor edema.  S/p right hip arthroplasty.  Surgical wound appears c/d/i Neurologic:  Normal speech and language. No gross focal neurologic deficits are appreciated. No gait instability. Skin:  Skin is warm, dry and intact. No rash noted. Psychiatric: Mood and affect are normal. Speech and behavior are normal.  ____________________________________________   LABS (all labs ordered are listed, but only abnormal results are displayed)  Results for orders placed or performed during the hospital encounter of 02/21/16 (from the past 24 hour(s))  Comprehensive metabolic panel     Status: Abnormal   Collection Time: 02/21/16  1:41 PM  Result Value Ref Range   Sodium 129 (L) 135 - 145 mmol/L   Potassium 4.8 3.5 - 5.1 mmol/L   Chloride 98 (L) 101 - 111 mmol/L   CO2 19 (L) 22 - 32 mmol/L   Glucose, Bld 118 (H) 65 - 99 mg/dL   BUN 86 (H) 6 - 20 mg/dL   Creatinine, Ser 1.83 (H) 0.44 - 1.00 mg/dL   Calcium 8.0 (L) 8.9 - 10.3 mg/dL   Total Protein 6.6 6.5 - 8.1 g/dL   Albumin 2.6 (L) 3.5 - 5.0 g/dL   AST 28 15 - 41 U/L   ALT 11 (L) 14 - 54 U/L   Alkaline Phosphatase 92 38 - 126 U/L   Total Bilirubin 0.3 0.3 - 1.2 mg/dL   GFR calc non Af Amer 27 (L) >60 mL/min   GFR calc Af  Amer 31 (L) >60 mL/min   Anion gap 12 5 - 15  CBC WITH DIFFERENTIAL     Status: Abnormal   Collection Time: 02/21/16  1:41 PM  Result Value Ref Range   WBC 26.7 (H) 3.6 - 11.0 K/uL   RBC 2.58 (L) 3.80 - 5.20 MIL/uL   Hemoglobin 7.7 (L) 12.0 - 16.0 g/dL   HCT 23.8 (L) 35.0 - 47.0 %   MCV 92.4 80.0 - 100.0 fL   MCH 29.7 26.0 - 34.0 pg   MCHC 32.1 32.0 - 36.0 g/dL   RDW 16.5 (H) 11.5 - 14.5 %   Platelets 579 (H) 150 - 440 K/uL   Neutrophils Relative % 90 %   Neutro Abs 24.1 (H) 1.4 - 6.5 K/uL   Lymphocytes Relative 3 %   Lymphs Abs 0.7 (L) 1.0 - 3.6 K/uL   Monocytes Relative 7 %   Monocytes Absolute 1.9 (H) 0.2 - 0.9 K/uL   Eosinophils Relative 0 %   Eosinophils Absolute 0.0 0 - 0.7  K/uL   Basophils Relative 0 %   Basophils Absolute 0.1 0 - 0.1 K/uL  Lactic acid, plasma     Status: None   Collection Time: 02/21/16  1:46 PM  Result Value Ref Range   Lactic Acid, Venous 1.4 0.5 - 1.9 mmol/L  Urinalysis, Routine w reflex microscopic     Status: Abnormal   Collection Time: 02/21/16  2:49 PM  Result Value Ref Range   Color, Urine YELLOW (A) YELLOW   APPearance CLEAR (A) CLEAR   Specific Gravity, Urine 1.014 1.005 - 1.030   pH 5.0 5.0 - 8.0   Glucose, UA NEGATIVE NEGATIVE mg/dL   Hgb urine dipstick NEGATIVE NEGATIVE   Bilirubin Urine NEGATIVE NEGATIVE   Ketones, ur NEGATIVE NEGATIVE mg/dL   Protein, ur NEGATIVE NEGATIVE mg/dL   Nitrite NEGATIVE NEGATIVE   Leukocytes, UA SMALL (A) NEGATIVE   RBC / HPF 0-5 0 - 5 RBC/hpf   WBC, UA 6-30 0 - 5 WBC/hpf   Bacteria, UA RARE (A) NONE SEEN   Squamous Epithelial / LPF 0-5 (A) NONE SEEN   Budding Yeast PRESENT   Influenza panel by PCR (type A & B)     Status: None   Collection Time: 02/21/16  3:55 PM  Result Value Ref Range   Influenza A By PCR NEGATIVE NEGATIVE   Influenza B By PCR NEGATIVE NEGATIVE   ____________________________________________  EKG My review and personal interpretation at Time: 17:13   Indication: chest pain   Rate: 85  Rhythm: sinus Axis: normal Other: no acute ischemic pattern, normal intervals ____________________________________________  RADIOLOGY  I personally reviewed all radiographic images ordered to evaluate for the above acute complaints and reviewed radiology reports and findings.  These findings were personally discussed with the patient.  Please see medical record for radiology report.  ____________________________________________   PROCEDURES  Procedure(s) performed:  Procedures    Critical Care performed: yes CRITICAL CARE Performed by: Merlyn Lot   Total critical care time: 45 minutes  Critical care time was exclusive of separately billable procedures and treating other patients.  Critical care was necessary to treat or prevent imminent or life-threatening deterioration.  Critical care was time spent personally by me on the following activities: development of treatment plan with patient and/or surrogate as well as nursing, discussions with consultants, evaluation of patient's response to treatment, examination of patient, obtaining history from patient or surrogate, ordering and performing treatments and interventions, ordering and review of laboratory studies, ordering and review of radiographic studies, pulse oximetry and re-evaluation of patient's condition.  ____________________________________________   INITIAL IMPRESSION / ASSESSMENT AND PLAN / ED COURSE  Pertinent labs & imaging results that were available during my care of the patient were reviewed by me and considered in my medical decision making (see chart for details).  DDX: pna, flu, septic arthritis, gi bleed, enteritis  Melissa Watkins is a 71 y.o. who presents to the ED with chief concern of chills, productive cough and pneumonia failing outpatient treatment status post recent admission for hip fracture. Patient arrives elderly and ill appearing but in no acute distress. She is afebrile here  but did have evidence of acute hypoxic respiratory failure. Chest x-ray shows no significant consolidation. Blood work does show evidence of acute leukocytosis. There is a mild metabolic acidosis that appears to be acute. Her troponin is negative. Her lactate is negative. EKG shows no evidence of acute ischemia. Urine is less suggestive of source of infection. Her right hip appears to be appropriately  tender in the postop period. There is no overlying cellulitis or warmth to suggest evidence of postop infection. Based on her new hypoxia, cough and recent treatment for pneumonia I do feel that this is a progress to healthcare associated pneumonia and we will start IV antibiotics appropriately. Patient will be provided IV bolus of fluids.  The patient also has evidence of anemia. Much of it is likely secondary to postop acute blood loss anemia however patient does have faintly positive stool guaiac. Denies any abdominal pain and is taking iron supplementation. Will need to be monitored but do not feel requires emergent transfussion. She is not hypotensive and has a history of heart failure there I'll provide a small volume at this time. Will continue to closely monitor.  I spoke with Dr. Clayton Bibles who kindly agrees to admit patient for further eval and management.  Have discussed with the patient and available family all diagnostics and treatments performed thus far and all questions were answered to the best of my ability. The patient demonstrates understanding and agreement with plan.       ____________________________________________   FINAL CLINICAL IMPRESSION(S) / ED DIAGNOSES  Final diagnoses:  Sepsis, due to unspecified organism ([place])  Acute respiratory failure with hypoxia ([facility])  Anemia, unspecified type      NEW MEDICATIONS STARTED DURING THIS VISIT:  New Prescriptions   No medications on file     Note:  This document was prepared using Dragon voice recognition software and may include  unintentional dictation errors.    Merlyn Lot, MD 02/21/16 [phone]

## 2016-02-21 NOTE — ED Notes (Signed)
Code  Sepsis  Called  To  [facility] 

## 2016-02-21 NOTE — H&P (Addendum)
[facility] [place] NAME: Melissa Watkins    MR#:  [id number]  DATE OF BIRTH:  08/19/1945  DATE OF ADMISSION:  02/21/2016  PRIMARY CARE PHYSICIAN: No PCP Per Patient   REQUESTING/REFERRING PHYSICIAN:   CHIEF COMPLAINT:   Chief Complaint  Patient presents with  . Code Sepsis    HISTORY OF PRESENT ILLNESS: Melissa Watkins  is a 71 y.o. female with a known history of Recent diagnosis of right femoral neck fracture status post right hip hemiarthroplasty 02/07/2016 by Dr. Mack Guise, who was discharged to skilled nursing facility on the 11th surgery 2018 comes back to the hospital with complaints of recurrent fevers, chills, elevated white blood cell count, hypoxia, cough, green phlegm production. Apparently patient has been having cough, green phlegm production for the past 4 days. Also, she was initiated on levofloxacin, however, her white blood cell count was found to be high and she was sent to emergency room for further evaluation and treatment. Moreover, her hemoglobin level was also found to be low. Patient noted black stool yesterday. Hemoccult of stool was positive. Hospitalist services were contacted for admission. Patient is confused, not able to provide review of systems  PAST MEDICAL HISTORY:   Past Medical History:  Diagnosis Date  . Back pain, chronic     PAST SURGICAL HISTORY: Past Surgical History:  Procedure Laterality Date  . CHOLECYSTECTOMY    . COLONOSCOPY    . HIP ARTHROPLASTY Right 02/07/2016   Procedure: ARTHROPLASTY BIPOLAR HIP (HEMIARTHROPLASTY);  Surgeon: Thornton Park, MD;  Location: [facility] ORS;  Service: Orthopedics;  Laterality: Right;  . OOPHORECTOMY      SOCIAL HISTORY:  Social History  Substance Use Topics  . Smoking status: Current Every Day Smoker    Packs/day: 1.00    Years: 50.00    Types: Cigarettes  . Smokeless tobacco: Never Used  . Alcohol use No     Comment: occasional    FAMILY  HISTORY:  Family History  Problem Relation Age of Onset  . Asthma Son     DRUG ALLERGIES:  Allergies  Allergen Reactions  . Oxycodone-Acetaminophen Other (See Comments)    Hallucinations  . Valium [Diazepam]     Review of Systems  Unable to perform ROS: Mental status change  Respiratory: Positive for cough, sputum production and shortness of breath.   Cardiovascular: Positive for chest pain.    MEDICATIONS AT HOME:  Prior to Admission medications   Medication Sig Start Date End Date Taking? Authorizing Provider  acetaminophen (TYLENOL) 500 MG tablet Take 500 mg by mouth every 12 (twelve) hours as needed.    Yes Historical Provider, MD  Acidophilus Lactobacillus CAPS Take 1 capsule by mouth daily. For 10 days   Yes Historical Provider, MD  aspirin 81 MG tablet Take 81 mg by mouth daily.   Yes Historical Provider, MD  enoxaparin (LOVENOX) 30 MG/0.3ML injection Inject 0.3 mLs (30 mg total) into the skin daily. 02/10/16  Yes Fritzi Mandes, MD  feeding supplement, ENSURE ENLIVE, (ENSURE ENLIVE) LIQD Take 237 mLs by mouth 2 (two) times daily between meals. 02/10/16  Yes Fritzi Mandes, MD  ferrous sulfate 325 (65 FE) MG tablet Take 1 tablet (325 mg total) by mouth 3 (three) times daily after meals. 02/10/16  Yes Fritzi Mandes, MD  HYDROcodone-acetaminophen (NORCO) 7.5-325 MG tablet Take 1-2 tablets by mouth every 4 (four) hours as needed for moderate pain. Patient taking differently: Take 1 tablet by mouth every 4 (four)  hours as needed for moderate pain.  02/10/16  Yes Fritzi Mandes, MD  levofloxacin (LEVAQUIN) 500 MG tablet Take 500 mg by mouth daily. 02/17/16 02/23/16 Yes Historical Provider, MD  metoprolol tartrate (LOPRESSOR) 25 MG tablet Take 1 tablet (25 mg total) by mouth 2 (two) times daily. 02/10/16  Yes Fritzi Mandes, MD  cephALEXin (KEFLEX) 250 MG capsule Take 1 capsule (250 mg total) by mouth every 12 (twelve) hours. Patient not taking: Reported on 02/21/2016 02/10/16   Fritzi Mandes, MD       PHYSICAL EXAMINATION:   VITAL SIGNS: Blood pressure (!) 155/58, pulse 86, temperature 97.5 F (36.4 C), temperature source Oral, resp. rate 20, height 5' (1.524 m), weight 50.3 kg (111 lb), SpO2 98 %.  GENERAL:  71 y.o.-year-old patient lying in the bed In mild-to-moderate distress due to discomfort in the chest in the leg or shortness of breath, restless, uncomfortable confused. Very hard hearing EYES: Pupils equal, round, reactive to light and accommodation. No scleral icterus. Extraocular muscles intact.  HEENT: Head atraumatic, normocephalic. Oropharynx and nasopharynx clear.  NECK:  Supple, no jugular venous distention. No thyroid enlargement, no tenderness.  LUNGS: Normal breath sounds bilaterally, no wheezing, rales,rhonchi , but bilateral anterior and posterior lung crepitations. Intermittent use of accessory muscles of respiration, with movements or exertion.  CARDIOVASCULAR: S1, S2 , irregularly irregular. No murmurs, rubs, or gallops.  ABDOMEN: Soft, mild discomfort in left side of abdomen but no rebound or guarding was noted, nondistended. Bowel sounds present. No organomegaly or mass.  EXTREMITIES: Trace lower extremity and pedal edema, no cyanosis, or clubbing.  NEUROLOGIC: Cranial nerves II through XII are intact. Muscle strength 5/5 in all extremities. Sensation intact. Gait not checked.  PSYCHIATRIC: The patient is alert and oriented x 2, has difficulty following commands, very hard of hearing, intermittently confused, agitated.  SKIN: No obvious rash, lesion, or ulcer.   LABORATORY PANEL:   CBC  Recent Labs Lab 02/21/16 1341  WBC 26.7*  HGB 7.7*  HCT 23.8*  PLT 579*  MCV 92.4  MCH 29.7  MCHC 32.1  RDW 16.5*  LYMPHSABS 0.7*  MONOABS 1.9*  EOSABS 0.0  BASOSABS 0.1   ------------------------------------------------------------------------------------------------------------------  Chemistries   Recent Labs Lab 02/21/16 1341  NA 129*  K 4.8  CL 98*   CO2 19*  GLUCOSE 118*  BUN 86*  CREATININE 1.83*  CALCIUM 8.0*  AST 28  ALT 11*  ALKPHOS 92  BILITOT 0.3   ------------------------------------------------------------------------------------------------------------------  Cardiac Enzymes No results for input(s): TROPONINI in the last 168 hours. ------------------------------------------------------------------------------------------------------------------  RADIOLOGY: Dg Chest 2 View  Result Date: 02/21/2016 CLINICAL DATA:  Leukocytosis. Currently being treated for pneumonia. Ex-smoker. EXAM: CHEST  2 VIEW COMPARISON:  02/07/2016 and thoracic spine dated 02/09/2016. FINDINGS: The cardiac silhouette remains borderline enlarged. The aorta remains tortuous and mildly calcified. Clear lungs with mildly prominent interstitial markings. Diffuse osteopenia. Multiple thoracic and thoracolumbar vertebral compression deformities without significant change since 02/09/2016. Moderate-sized hiatal hernia. IMPRESSION: 1. No acute abnormality. 2. Stable borderline cardiomegaly, aortic atherosclerosis, mild chronic interstitial lung disease and moderate-sized hiatal hernia. 3. Stable multiple thoracic and thoracolumbar vertebral compression deformities. Electronically Signed   By: Claudie Revering M.D.   On: 02/21/2016 16:27    EKG: Orders placed or performed during the hospital encounter of 02/21/16  . ED EKG 12-Lead  . ED EKG 12-Lead  EKG in the emergency room revealed A. fib with rate of 86 bpm, left ventricular hypertrophy, no acute ST-T changes  IMPRESSION AND PLAN:  Active Problems:   Acute respiratory failure with hypoxia (HCC)   Acute on chronic renal insufficiency   Hyponatremia   Acute posthemorrhagic anemia   Leukocytosis   Hypoxia #1. Acute respiratory failure with hypoxia, admit patient to medical floor, continue oxygen therapy as needed, weaning off as an tolerated #2. Acute bronchitis versus Healthcare associated pneumonia,  continue broad-spectrum and antibiotic  therapy, get sputum cultures, chest x-ray was negative for pneumonia, get CT scan of the chest #3 lower extremity edema, get Doppler ultrasound to rule out DVT . #4. Acute posthemorrhagic anemia, transfuse patient with packed red blood cells as patient is symptomatic with significant weakness and fatigue, shortness of breath, discussed pros and cons with patient's son, he is agreeable to transfusion #5. Leukocytosis, follow with antibiotic therapy #6. Gastrointestinal bleeding, possibly acute on chronic, hold aspirin or Lovenox, initiate Protonix intravenously, get gastroenterologist to see patient in consultation   All the records are reviewed and case discussed with ED provider. Management plans discussed with the patient, family and they are in agreement.  CODE STATUS: Code Status History    Date Active Date Inactive Code Status Order ID Comments User Context   02/07/2016  2:41 PM 02/07/2016  9:54 PM Full Code [id number]  Thornton Park, MD Inpatient   02/07/2016  5:42 AM 02/07/2016  2:41 PM Full Code [id number]  Saundra Shelling, MD Inpatient       TOTAL TIME TAKING CARE OF THIS PATIENT: 55 minutes.    Theodoro Grist M.D on 02/21/2016 at 7:09 PM  Between 7am to 6pm - Pager - [phone] After 6pm go to [email or web address] - password [email or web address]  [facility]  Office  [phone]  CC: Primary care physician; No PCP Per Patient

## 2016-02-22 ENCOUNTER — Inpatient Hospital Stay: Payer: Medicare Other

## 2016-02-22 DIAGNOSIS — N1330 Unspecified hydronephrosis: Secondary | ICD-10-CM

## 2016-02-22 DIAGNOSIS — R338 Other retention of urine: Secondary | ICD-10-CM

## 2016-02-22 DIAGNOSIS — Z791 Long term (current) use of non-steroidal anti-inflammatories (NSAID): Secondary | ICD-10-CM

## 2016-02-22 DIAGNOSIS — K922 Gastrointestinal hemorrhage, unspecified: Secondary | ICD-10-CM

## 2016-02-22 LAB — TYPE AND SCREEN
ABO/RH(D): O POS
Antibody Screen: NEGATIVE
Unit division: 0

## 2016-02-22 LAB — CBC
HCT: 24.6 % — ABNORMAL LOW (ref 35.0–47.0)
HEMOGLOBIN: 8.3 g/dL — AB (ref 12.0–16.0)
MCH: 29.4 pg (ref 26.0–34.0)
MCHC: 33.7 g/dL (ref 32.0–36.0)
MCV: 87.2 fL (ref 80.0–100.0)
Platelets: 426 10*3/uL (ref 150–440)
RBC: 2.82 MIL/uL — ABNORMAL LOW (ref 3.80–5.20)
RDW: 21.1 % — ABNORMAL HIGH (ref 11.5–14.5)
WBC: 17.8 10*3/uL — ABNORMAL HIGH (ref 3.6–11.0)

## 2016-02-22 LAB — MRSA PCR SCREENING: MRSA by PCR: NEGATIVE

## 2016-02-22 LAB — URINE CULTURE: CULTURE: NO GROWTH

## 2016-02-22 LAB — BASIC METABOLIC PANEL
ANION GAP: 10 (ref 5–15)
BUN: 71 mg/dL — ABNORMAL HIGH (ref 6–20)
CALCIUM: 7.7 mg/dL — AB (ref 8.9–10.3)
CO2: 22 mmol/L (ref 22–32)
Chloride: 103 mmol/L (ref 101–111)
Creatinine, Ser: 1.26 mg/dL — ABNORMAL HIGH (ref 0.44–1.00)
GFR, EST AFRICAN AMERICAN: 49 mL/min — AB (ref >60)
GFR, EST NON AFRICAN AMERICAN: 42 mL/min — AB (ref >60)
GLUCOSE: 83 mg/dL (ref 65–99)
Potassium: 4.1 mmol/L (ref 3.5–5.1)
SODIUM: 135 mmol/L (ref 135–145)

## 2016-02-22 LAB — TROPONIN I
TROPONIN I: 0.04 ng/mL — AB (ref <0.03)
Troponin I: 0.04 ng/mL (ref <0.03)

## 2016-02-22 LAB — OCCULT BLOOD X 1 CARD TO LAB, STOOL: Fecal Occult Bld: POSITIVE — AB

## 2016-02-22 MED ORDER — CEFTRIAXONE SODIUM-DEXTROSE 1-3.74 GM-% IV SOLR
1.0000 g | INTRAVENOUS | Status: DC
  Administered 2016-02-22: 1 g via INTRAVENOUS
  Filled 2016-02-22 (×2): qty 50

## 2016-02-22 MED ORDER — DEXTROSE 5 % IV SOLN
2.0000 g | Freq: Two times a day (BID) | INTRAVENOUS | Status: DC
  Administered 2016-02-22: 2 g via INTRAVENOUS
  Filled 2016-02-22 (×2): qty 2

## 2016-02-22 MED ORDER — DEXTROSE 5 % IV SOLN: 1.0000 g | INTRAVENOUS | Status: DC

## 2016-02-22 MED ORDER — BUDESONIDE 0.25 MG/2ML IN SUSP
0.2500 mg | Freq: Two times a day (BID) | RESPIRATORY_TRACT | Status: DC
  Administered 2016-02-22 – 2016-02-25 (×7): 0.25 mg via RESPIRATORY_TRACT
  Filled 2016-02-22 (×7): qty 2

## 2016-02-22 MED ORDER — IPRATROPIUM-ALBUTEROL 0.5-2.5 (3) MG/3ML IN SOLN
3.0000 mL | Freq: Four times a day (QID) | RESPIRATORY_TRACT | Status: DC
  Administered 2016-02-22 – 2016-02-23 (×6): 3 mL via RESPIRATORY_TRACT
  Filled 2016-02-22 (×6): qty 3

## 2016-02-22 NOTE — Consult Note (Signed)
Jonathon Bellows MD  [address] [place], [place] [address] Phone: [phone] Fax : [phone]  Consultation Primary Care Physician:  No PCP Per Patient Primary Gastroenterologist:  Dr. Vicente Males         Reason for Consultation:     Anemia  Date of Admission:  02/21/2016 Date of Consultation:  02/22/2016         HPI:   Melissa Watkins is a 71 y.o. female was admitted to the hospital yesterday with confusion and acute respiratory failure from possible bronchitis vs healthcare associated pneumonia . At the same time was also noted to to have a low hemoglobin, patient mentioned having black stool and hence we were consulted. She has recently had a right femoral neck fracture status post right hip hemiarthroplasty 02/07/2016 by Dr. Mack Guise, who was discharged to skilled nursing facility on the 11th surgery 2018 . She was on Asprin 81 mg a day .   Recent admission after the fall her Hb was 13.3 at baseline, at discharge was 9.2. On admission her Hb was 7.7 with mcv of 92 and elevated BUN. Also noted to be in AKI.   CXR and CT chest from yesterday do not clearly demonstrate a pneumonia.  She denies any complaints - not able to give a clear history. Says no abdominal pain, or a bowel movement today . She takes exedrin 1 tablet daily for back pain long term.  Past Medical History:  Diagnosis Date  . Back pain, chronic     Past Surgical History:  Procedure Laterality Date  . CHOLECYSTECTOMY    . COLONOSCOPY    . HIP ARTHROPLASTY Right 02/07/2016   Procedure: ARTHROPLASTY BIPOLAR HIP (HEMIARTHROPLASTY);  Surgeon: Thornton Park, MD;  Location: [facility] ORS;  Service: Orthopedics;  Laterality: Right;  . OOPHORECTOMY      Prior to Admission medications   Medication Sig Start Date End Date Taking? Authorizing Provider  acetaminophen (TYLENOL) 500 MG tablet Take 500 mg by mouth every 12 (twelve) hours as needed.    Yes Historical Provider, MD  Acidophilus Lactobacillus CAPS Take 1 capsule by mouth  daily. For 10 days   Yes Historical Provider, MD  aspirin 81 MG tablet Take 81 mg by mouth daily.   Yes Historical Provider, MD  enoxaparin (LOVENOX) 30 MG/0.3ML injection Inject 0.3 mLs (30 mg total) into the skin daily. 02/10/16  Yes Fritzi Mandes, MD  feeding supplement, ENSURE ENLIVE, (ENSURE ENLIVE) LIQD Take 237 mLs by mouth 2 (two) times daily between meals. 02/10/16  Yes Fritzi Mandes, MD  ferrous sulfate 325 (65 FE) MG tablet Take 1 tablet (325 mg total) by mouth 3 (three) times daily after meals. 02/10/16  Yes Fritzi Mandes, MD  HYDROcodone-acetaminophen (NORCO) 7.5-325 MG tablet Take 1-2 tablets by mouth every 4 (four) hours as needed for moderate pain. Patient taking differently: Take 1 tablet by mouth every 4 (four) hours as needed for moderate pain.  02/10/16  Yes Fritzi Mandes, MD  levofloxacin (LEVAQUIN) 500 MG tablet Take 500 mg by mouth daily. 02/17/16 02/23/16 Yes Historical Provider, MD  metoprolol tartrate (LOPRESSOR) 25 MG tablet Take 1 tablet (25 mg total) by mouth 2 (two) times daily. 02/10/16  Yes Fritzi Mandes, MD  cephALEXin (KEFLEX) 250 MG capsule Take 1 capsule (250 mg total) by mouth every 12 (twelve) hours. Patient not taking: Reported on 02/21/2016 02/10/16   Fritzi Mandes, MD    Family History  Problem Relation Age of Onset  . Asthma Son      Social  History  Substance Use Topics  . Smoking status: Current Every Day Smoker    Packs/day: 1.00    Years: 50.00    Types: Cigarettes  . Smokeless tobacco: Never Used  . Alcohol use No     Comment: occasional    Allergies as of 02/21/2016 - Review Complete 02/21/2016  Allergen Reaction Noted  . Oxycodone-acetaminophen Other (See Comments) 09/06/2013  . Valium [diazepam]  07/15/2013    Review of Systems:    All systems reviewed and negative except where noted in HPI.   Physical Exam:  Vital signs in last 24 hours: Temp:  [97.5 F (36.4 C)-99 F (37.2 C)] 97.7 F (36.5 C) (01/23 0800) Pulse Rate:  [76-92] 83 (01/23 0800) Resp:   [15-24] 18 (01/23 0800) BP: (118-159)/(46-90) 149/59 (01/23 0800) SpO2:  [82 %-100 %] 96 % (01/23 0800) FiO2 (%):  [28 %] 28 % (01/22 2239) Weight:  [111 lb (50.3 kg)-120 lb 3.2 oz (54.5 kg)] 120 lb 3.2 oz (54.5 kg) (01/22 2238) Last BM Date: 02/22/16 General:   Pleasant, cooperative in NAD, very thin  Head:  Normocephalic and atraumatic. Eyes:   No icterus.   Conjunctiva pink. PERRLA. Ears:  Normal auditory acuity. Neck:  Supple; no masses or thyroidomegaly Lungs: air entry b/l equal , scattered experiatory wheeze b/l anteriorly Heart:  Regular rate and rhythm;  Without murmur, clicks, rubs or gallops Abdomen:  Soft, nondistended, nontender. Normal bowel sounds. No appreciable masses or hepatomegaly.  No rebound or guarding.  Rectal:  Not performed. Neurologic:  Alert and oriented x2-3(varies);  grossly normal neurologically. Skin:  Intact without significant lesions or rashes. Cervical Nodes:  No significant cervical adenopathy. Psych:  Alert and cooperative. Normal affect.  LAB RESULTS:  Recent Labs  02/21/16 1341 02/22/16 0424  WBC 26.7* 17.8*  HGB 7.7* 8.3*  HCT 23.8* 24.6*  PLT 579* 426   BMET  Recent Labs  02/21/16 1341 02/22/16 0424  NA 129* 135  K 4.8 4.1  CL 98* 103  CO2 19* 22  GLUCOSE 118* 83  BUN 86* 71*  CREATININE 1.83* 1.26*  CALCIUM 8.0* 7.7*   LFT  Recent Labs  02/21/16 1341  PROT 6.6  ALBUMIN 2.6*  AST 28  ALT 11*  ALKPHOS 92  BILITOT 0.3   PT/INR No results for input(s): LABPROT, INR in the last 72 hours.  STUDIES: Dg Chest 2 View  Result Date: 02/21/2016 CLINICAL DATA:  Leukocytosis. Currently being treated for pneumonia. Ex-smoker. EXAM: CHEST  2 VIEW COMPARISON:  02/07/2016 and thoracic spine dated 02/09/2016. FINDINGS: The cardiac silhouette remains borderline enlarged. The aorta remains tortuous and mildly calcified. Clear lungs with mildly prominent interstitial markings. Diffuse osteopenia. Multiple thoracic and thoracolumbar  vertebral compression deformities without significant change since 02/09/2016. Moderate-sized hiatal hernia. IMPRESSION: 1. No acute abnormality. 2. Stable borderline cardiomegaly, aortic atherosclerosis, mild chronic interstitial lung disease and moderate-sized hiatal hernia. 3. Stable multiple thoracic and thoracolumbar vertebral compression deformities. Electronically Signed   By: Claudie Revering M.D.   On: 02/21/2016 16:27   Ct Chest Wo Contrast  Result Date: 02/21/2016 CLINICAL DATA:  Acute onset of fever, chills, leukocytosis, hypoxia and productive cough. Shortness of breath and generalized chest pain. Initial encounter. EXAM: CT CHEST WITHOUT CONTRAST TECHNIQUE: Multidetector CT imaging of the chest was performed following the standard protocol without IV contrast. COMPARISON:  Chest radiograph performed earlier today at 4:01 p.m. FINDINGS: Cardiovascular: Scattered coronary artery calcifications are seen. The heart remains normal in size. Scattered calcification is  noted along the aortic arch. The great vessels are grossly unremarkable in appearance. Mediastinum/Nodes: No mediastinal lymphadenopathy is seen. No pericardial effusion is identified. A moderate hiatal hernia is noted. The thyroid gland is grossly unremarkable. No axillary lymphadenopathy is seen. Lungs/Pleura: Trace bilateral pleural fluid is noted, with associated atelectasis. Bilateral emphysema is noted, more prominent at the upper lung lobes. No pneumothorax is seen. No masses are identified. Upper Abdomen: The visualized portions of the liver and spleen are grossly unremarkable. The patient is status post cholecystectomy, with clips noted at the gallbladder fossa. The visualized portions of the adrenal glands are within normal limits. Mild left-sided hydronephrosis is noted. Scattered hypodense and hyperdense bilateral renal cysts are partially imaged. A nonobstructing 7 mm stone is noted at the interpole region of the left kidney.  Musculoskeletal: No acute osseous abnormalities are identified. Chronic compression deformities are noted at T5, T7, T8, T10, T11 and L1. There is chronic marked deformity of the body of the sternum. The visualized musculature is unremarkable in appearance. IMPRESSION: 1. Trace bilateral pleural fluid, with associated atelectasis. 2. Bilateral emphysema, more prominent at the upper lung lobes. 3. Scattered coronary artery calcification. 4. Moderate hiatal hernia noted. 5. Mild left-sided hydronephrosis noted. Would correlate with the patient's symptoms. 6. Scattered bilateral renal cysts are partially imaged. Nonobstructing 7 mm at the interpole region of the left kidney. 7. Multiple chronic compression deformities along the thoracic and lower lumbar spine. Chronic marked deformity of the body of the sternum. Electronically Signed   By: Garald Balding M.D.   On: 02/21/2016 20:02      Impression / Plan:   Melissa Watkins is a 71 y.o. y/o female with recent Rt hip surgery , admitted last night with respiratory failure probably secondary to bronchitis, drop in Hb , melena. I have been consulted to evaluate for a GI bleed, Also note she is in AKI. History suggestive of an upper GI bleed likelly secondary to NSAID use . (normocytic acute anemia with elevated BUN and melena). She has already been eating , had a food tray next to her . Stable presently  Plan  1. CBC monitor 2. IV PPI 3. H pylori stool antigen  5. EGD  when she feels better from respiratory point of view and will be able to tolerate her anesthesia better,if bleeds in the interim then will have no choice but to proceed with urgent EGD.  6. No NSAID's  Thank you for involving me in the care of this patient.      LOS: 1 day   Jonathon Bellows, MD  02/22/2016, 9:42 AM

## 2016-02-22 NOTE — NC FL2 (Signed)
[place] LEVEL OF CARE SCREENING TOOL     IDENTIFICATION  Patient Name: Melissa Watkins Birthdate: 06-Feb-1945 Sex: female Admission Date (Current Location): 02/21/2016  [place] and [place] Number:  Engineering geologist and Address:  [facility], [address], [place], [place] [address]      Provider Number: [id number]  Attending Physician Name and Address:  Loletha Grayer, MD  Relative Name and Phone Number:       Current Level of Care: Hospital Recommended Level of Care: [place] Prior Approval Number:    Date Approved/Denied:   PASRR Number:  [id number])  Discharge Plan: SNF    Current Diagnoses: Patient Active Problem List   Diagnosis Date Noted  . Acute respiratory failure with hypoxia ([place]) 02/21/2016  . Acute on chronic renal insufficiency 02/21/2016  . Hyponatremia 02/21/2016  . Acute posthemorrhagic anemia 02/21/2016  . Leukocytosis 02/21/2016  . Hypoxia 02/21/2016  . Hip fracture ([place]) 02/07/2016  . CVA (cerebral vascular accident) ([place]) 07/17/2013  . Palpitations 07/17/2013    Orientation RESPIRATION BLADDER Height & Weight     Self, Place  Normal Incontinent Weight: 120 lb 3.2 oz (54.5 kg) Height:  '5\' 1"'$  (154.9 cm)  BEHAVIORAL SYMPTOMS/MOOD NEUROLOGICAL BOWEL NUTRITION STATUS   (None.)  (None. ) Continent Diet (Diet: Heart Room )  AMBULATORY STATUS COMMUNICATION OF NEEDS Skin   Extensive Assist Verbally Surgical wounds (Incision: Right Hip)                       Personal Care Assistance Level of Assistance  Bathing, Feeding, Dressing Bathing Assistance: Limited assistance Feeding assistance: Independent Dressing Assistance: Limited assistance     Functional Limitations Info  Sight, Hearing, Speech   Hearing Info: Impaired Speech Info: Impaired (Missing Teeth )    SPECIAL CARE FACTORS FREQUENCY  PT (By licensed PT), OT (By licensed OT)     PT Frequency:  (5) OT Frequency:   (5)            Contractures      Additional Factors Info  Code Status, Allergies Code Status Info:  (Full Code) Allergies Info:  ( Oxycodone-acetaminophen, Valium Diazepam)           Current Medications (02/22/2016):  This is the current hospital active medication list Current Facility-Administered Medications  Medication Dose Route Frequency Provider Last Rate Last Dose  . 0.9 %  sodium chloride infusion  250 mL Intravenous PRN Theodoro Grist, MD      . acetaminophen (TYLENOL) tablet 500 mg  500 mg Oral Q6H PRN Theodoro Grist, MD      . ceFEPIme (MAXIPIME) 2 g in dextrose 5 % 50 mL IVPB  2 g Intravenous Q12H Merilyn Baba, RPH      . feeding supplement (ENSURE ENLIVE) (ENSURE ENLIVE) liquid 237 mL  237 mL Oral BID BM Theodoro Grist, MD      . HYDROcodone-acetaminophen (NORCO) 7.5-325 MG per tablet 1 tablet  1 tablet Oral Q4H PRN Theodoro Grist, MD      . metoprolol tartrate (LOPRESSOR) tablet 25 mg  25 mg Oral BID Theodoro Grist, MD   25 mg at 02/21/16 2342  . ondansetron (ZOFRAN) tablet 4 mg  4 mg Oral Q6H PRN Theodoro Grist, MD       Or  . ondansetron (ZOFRAN) injection 4 mg  4 mg Intravenous Q6H PRN Theodoro Grist, MD      . pantoprazole (PROTONIX) injection 40 mg  40  mg Intravenous Q12H Theodoro Grist, MD   40 mg at 02/21/16 2255  . sodium chloride flush (NS) 0.9 % injection 3 mL  3 mL Intravenous Q12H Theodoro Grist, MD   3 mL at 02/21/16 2255  . sodium chloride flush (NS) 0.9 % injection 3 mL  3 mL Intravenous Q12H Theodoro Grist, MD   3 mL at 02/21/16 2255  . sodium chloride flush (NS) 0.9 % injection 3 mL  3 mL Intravenous PRN Theodoro Grist, MD      . Derrill Memo ON 02/23/2016] vancomycin (VANCOCIN) IVPB 750 mg/150 ml premix  750 mg Intravenous Q36H Napoleon Form, [facility]         Discharge Medications: Please see discharge summary for a list of discharge medications.  Relevant Imaging Results:  Relevant Lab Results:   Additional Information  (SSN: [id number])  Danie Chandler, Student-Social Work

## 2016-02-22 NOTE — Evaluation (Signed)
Physical Therapy Evaluation Patient Details Name: Melissa Watkins MRN: [id number] DOB: [birth year] Today's Date: 02/22/2016   History of Present Illness  Pt is a 71 y/o F with a known history of recent diagnosis of right femoral neck fracture status post right hip hemiarthroplasty 02/07/2016 who returns to the hospital with c/o fevers, chills, hypoxia, and cough.  Pt admitted with respiratory failure likely secondary to bronchitis, drop in Hgb, and melena. Pt's PMH includes chronic back pain.    Clinical Impression  Pt admitted with above diagnosis. Pt currently with functional limitations due to the deficits listed below (see PT Problem List). Melissa Watkins presents with generalized weakness and muscle guarding of R hip. Per family, pt has not been ambulating and has only been getting to chair at SNF PTA.  She did not recall any of her hip precautions so these were reviewed and pt was provided with a handout.  Pt currently requires max assist for bed mobility and min>mod assist to maintain balance sitting EOB. Pt will benefit from skilled PT to increase their independence and safety with mobility to allow discharge to the venue listed below.      Follow Up Recommendations SNF    Equipment Recommendations  None recommended by PT    Recommendations for Other Services OT consult     Precautions / Restrictions Precautions Precautions: Fall;Posterior Hip Precaution Booklet Issued: Yes (comment) Precaution Comments: Posterior Hip precautions. Restrictions Weight Bearing Restrictions: Yes RLE Weight Bearing: Weight bearing as tolerated      Mobility  Bed Mobility Overal bed mobility: Needs Assistance Bed Mobility: Supine to Sit;Sit to Supine     Supine to sit: Max assist;HOB elevated Sit to supine: Max assist   General bed mobility comments: Pt hesitant at first to attempt bed mobility due to pain but was agreeable.  Pt requires max assist for managment of LEs and assist to elevate  trunk to achieve supine.  To return to supine pt requires max assist to guide trunk and to manage LEs.  Transfers                 General transfer comment: Unable to attemp at this time as pt refusing  Ambulation/Gait                Stairs            Wheelchair Mobility    Modified Rankin (Stroke Patients Only)       Balance Overall balance assessment: Needs assistance Sitting-balance support: Feet supported;Bilateral upper extremity supported Sitting balance-Leahy Scale: Poor Sitting balance - Comments: Pt relies on BUE support while seated EOB in addition to min>mod assist to maintain balance sitting EOB ~4 minutes.                                     Pertinent Vitals/Pain Pain Assessment: Faces Faces Pain Scale: Hurts even more Pain Location: Right Hip Pain Descriptors / Indicators: Guarding;Moaning Pain Intervention(s): Limited activity within patient's tolerance;Monitored during session    Home Living Family/patient expects to be discharged to:: Skilled nursing facility Living Arrangements: Alone   Type of Home: House Home Access: Level entry     Home Layout: One level Home Equipment: Environmental consultant - 2 wheels;Wheelchair - manual      Prior Function Level of Independence: Needs assistance   Gait / Transfers Assistance Needed: Pt was max +2 assist for bed mobility and stand pivot transfers  during last admission at the beginning of Jan 2017.  Per family and pt the pt was only doing stand pivot transfers at [facility] and has not yet begun ambulation.    ADL's / Homemaking Assistance Needed: Needs assist with bathing, dressing.        Hand Dominance   Dominant Hand: Right    Extremity/Trunk Assessment   Upper Extremity Assessment Upper Extremity Assessment:  (Strength grossly 4/5)    Lower Extremity Assessment Lower Extremity Assessment: RLE deficits/detail RLE Deficits / Details: Limited ROM and strength due to recent hip surgery.   Strength grossly 3/5. RLE: Unable to fully assess due to pain    Cervical / Trunk Assessment Cervical / Trunk Assessment: Kyphotic  Communication   Communication: HOH  Cognition Arousal/Alertness: Awake/alert Behavior During Therapy: WFL for tasks assessed/performed (pt joking around with PT and family majority of session) Overall Cognitive Status: Impaired/Different from baseline Area of Impairment: Safety/judgement         Safety/Judgement: Decreased awareness of safety;Decreased awareness of deficits     General Comments: Pt continues to perform R heel slide for comfort immediately after being instructed in her hip precautions    General Comments      Exercises General Exercises - Lower Extremity Ankle Circles/Pumps: AROM;Both;10 reps;Supine Quad Sets: Strengthening;Both;10 reps;Supine Gluteal Sets: Strengthening;Both;10 reps;Supine Hip ABduction/ADduction: Strengthening;AAROM;Right;10 reps;Supine   Assessment/Plan    PT Assessment Patient needs continued PT services  PT Problem List Decreased strength;Decreased range of motion;Decreased activity tolerance;Decreased balance;Decreased mobility;Decreased knowledge of use of DME;Decreased safety awareness;Pain          PT Treatment Interventions DME instruction;Gait training;Functional mobility training;Therapeutic activities;Therapeutic exercise;Balance training;Neuromuscular re-education;Cognitive remediation;Patient/family education;Modalities    PT Goals (Current goals can be found in the Care Plan section)  Acute Rehab PT Goals Patient Stated Goal: Rehab before home PT Goal Formulation: With patient Time For Goal Achievement: 03/07/16 Potential to Achieve Goals: Fair    Frequency 7X/week   Barriers to discharge        Co-evaluation               End of Session   Activity Tolerance: Patient limited by fatigue;Patient limited by pain Patient left: in bed;with call bell/phone within reach;with bed  alarm set;with SCD's reapplied;with nursing/sitter in room Nurse Communication: Mobility status;Weight bearing status;Precautions         Time: 9147-8295 PT Time Calculation (min) (ACUTE ONLY): 16 min   Charges:   PT Evaluation $PT Eval Low Complexity: 1 Procedure     PT G CodesCollie Siad PT, DPT 02/22/2016, 2:28 PM

## 2016-02-22 NOTE — Progress Notes (Signed)
New Admission Note:   Arrival Method: per stretcher from ED, pt came from [place] Orientation: alert and oriented to person and place, reoriented to time and situation Telemetry: placed on box 4031, CCMD notified, verified with RN Olivia Mackie Assessment: Completed Skin: warm, dry, with healing surgical incision on the right hip with steri-strips dressing, clean, dry and intact, with scattered bruises on both arms, with MASD underneath both breasts and buttocks, prophylactic sacral foam dressing applied. IV: G20 on the left forearm, G22 on the right AC, both with transparent dressing and intact Pain: denies any pain as of this time Safety Measures: Safety Fall Prevention Plan has been given and discussed Admission: Completed 1A Orientation: Patient has been oriented to the room, unit and staff.  Family: no family member present at bedside as of this time  Orders have been reviewed and implemented. Will continue to monitor patient. Call light has been placed within reach and bed alarm has been activated.   Georgeanna Harrison BSN, RN [facility] 1A

## 2016-02-22 NOTE — Progress Notes (Signed)
Pharmacy Antibiotic Note  Melissa Watkins is a 71 y.o. female  s/p hip fracture repair 1/8 admitted on 1/22/2018with pneumonia and sepsis.  Pharmacy has been consulted for cefepime and vancomycin dosing.  Plan: MRSA PCR negative. Vancomycin d/c.    Increase cefepime to 2 g iv q 12 hours for improved renal function and Pseudomonas coverage.   Height: '5\' 1"'$  (154.9 cm) Weight: 120 lb 3.2 oz (54.5 kg) IBW/kg (Calculated) : 47.8  Temp (24hrs), Avg:98.4 F (36.9 C), Min:97.5 F (36.4 C), Max:99 F (37.2 C)   Recent Labs Lab 02/21/16 1341 02/21/16 1346 02/22/16 0424  WBC 26.7*  --  17.8*  CREATININE 1.83*  --  1.26*  LATICACIDVEN  --  1.4  --     Estimated Creatinine Clearance: 31.8 mL/min (by C-G formula based on SCr of 1.26 mg/dL (H)).    Allergies  Allergen Reactions  . Oxycodone-Acetaminophen Other (See Comments)    Hallucinations  . Valium [Diazepam]     Antimicrobials this admission: vancomycin 1/22 >> 1/23 cefepime 1/22 >>   Dose adjustments this admission:   Microbiology results: 1/22 BCx: NGTD 1/22 UCx: pending   MRSA PCR: negative SCx: pending  Thank you for allowing pharmacy to be a part of this patient's care.  Ulice Dash D 02/22/2016 11:59 AM

## 2016-02-22 NOTE — Progress Notes (Signed)
Patient had a moderate size black tarry stool.  Patient had a catheter placed for urinary retention with initial Foley emptied 1300 mL.  BBR

## 2016-02-22 NOTE — Clinical Social Work Placement (Signed)
   CLINICAL SOCIAL WORK PLACEMENT  NOTE  Date:  02/22/2016  Patient Details  Name: Melissa Watkins MRN: [id number] Date of Birth: 01-May-1945  Clinical Social Work is seeking post-discharge placement for this patient at the [place] level of care (*CSW will initial, date and re-position this form in  chart as items are completed):  Yes   Patient/family provided with [place] Work Department's list of facilities offering this level of care within the geographic area requested by the patient (or if unable, by the patient's family).  Yes   Patient/family informed of their freedom to choose among providers that offer the needed level of care, that participate in Medicare, Medicaid or managed care program needed by the patient, have an available bed and are willing to accept the patient.  Yes   Patient/family informed of [facility]'s ownership interest in [facility] and [facility], as well as of the fact that they are under no obligation to receive care at these facilities.  PASRR submitted to EDS on       PASRR number received on       Existing PASRR number confirmed on 02/22/16     FL2 transmitted to all facilities in geographic area requested by pt/family on 02/22/16     FL2 transmitted to all facilities within larger geographic area on 02/22/16     Patient informed that his/her managed care company has contracts with or will negotiate with certain facilities, including the following:            Patient/family informed of bed offers received.  Patient chooses bed at       Physician recommends and patient chooses bed at      Patient to be transferred to   on  .  Patient to be transferred to facility by       Patient family notified on   of transfer.  Name of family member notified:        PHYSICIAN       Additional Comment:    _______________________________________________ Danie Chandler, [place] Work 02/22/2016,  11:00 AM

## 2016-02-22 NOTE — Progress Notes (Signed)
CRITICAL VALUE ALERT  Critical value received:  Troponin-0.04  Date of notification:  02/22/2016  Time of notification:  0040  Critical value read back:Yes.    Nurse who received alert:  Maretta Bees., RN  MD notified (1st page):  Dr. Almyra Free  Time of first page:  0041  MD notified (2nd page):  Time of second page:  Responding MD:  Dr. Almyra Free, no new orders made  Time MD responded:  [phone]

## 2016-02-22 NOTE — Clinical Social Work Note (Signed)
Clinical Social Work Assessment  Patient Details  Name: Melissa Watkins MRN: [id number] Date of Birth: [birth year]  Date of referral:  02/22/16               Reason for consult:  Facility Placement                Permission sought to share information with:  Chartered certified accountant granted to share information::  Yes, Verbal Permission Granted  Name::      [place]::   [facility]  Relationship::     Contact Information:     Housing/Transportation Living arrangements for the past 2 months:  [place] (Melissa Watkins ) Source of Information:  Adult Children Patient Interpreter Needed:  None Criminal Activity/Legal Involvement Pertinent to Current Situation/Hospitalization:  No - Comment as needed Significant Relationships:  Adult Children, Other Family Members Lives with:  Facility Resident ([name] ) Do you feel safe going back to the place where you live?  No (Family has requested a change is facility placement as patient is not happy.) Need for family participation in patient care:  Yes (Comment)  Care giving concerns:  Patient is a short-term rehab patient from [facility].   Social Worker assessment / plan:  Social work Theatre manager received social work consult. PT has not worked with patient at this time. Social work Theatre manager was unable to assess patient, due to patient being out of the room. Social work Theatre manager spoke to patient's son Melissa Watkins and Melissa Watkins. Per son Melissa Watkins, patient has been at [facility] for short-term rehab for about two weeks now. Patient and patient's family are unhappy with the care the patient is receiving and is requesting that patient be discharged to another facility. Social work Theatre manager explained that she will send patient's information out to new facilities, but that does not mean a facility will be able to accept patient. Patient's son Melissa Watkins verbally agreed he  understood. Social work intern left patient's son Melissa Watkins a SNF list for him to review. Patient's sons do not have facility a preference at this time. Social work Theatre manager will keep patient's family updated on new facilities. Patient's HPOA is son Melissa Watkins. Social work Theatre manager will continue to assist and follow as needed.   Fl2 completed and faxed out.   Employment status:  Unemployed Forensic scientist:  Medicare PT Recommendations:  Not assessed at this time Information / Referral to community resources:  [place]  Patient/Family's Response to care:  Patient and patient's family are unhappy with [facility] and prefer patient to not return back when discharged.   Patient/Family's Understanding of and Emotional Response to Diagnosis, Current Treatment, and Prognosis:  Patient's sons were pleasant and thanked social work Theatre manager for calling and coming by.   Emotional Assessment Appearance:  Other (Comment Required, Appears stated age Attitude/Demeanor/Rapport:    Affect (typically observed):  Accepting, Adaptable, Appropriate Orientation:  Oriented to Self, Oriented to Place Alcohol / Substance use:  Not Applicable Psych involvement (Current and /or in the community):  No (Comment)  Discharge Needs  Concerns to be addressed:  Basic Needs, Discharge Planning Concerns Readmission within the last 30 days:  Yes Current discharge risk:  Dependent with Mobility, Other (New facility placement.) Barriers to Discharge:  Continued Medical Work up   [employer], [place] Work 02/22/2016, 10:47 AM

## 2016-02-22 NOTE — Progress Notes (Signed)
PT Cancellation Note  Patient Details Name: Melissa Watkins MRN: [id number] DOB: Aug 12, 1945   Cancelled Treatment:    Reason Eval/Treat Not Completed: Other (comment). Pt currently out of room at this time. Will evaluate at later time.   Yzabelle Calles 02/22/2016, 9:33 AM  Greggory Stallion, PT, DPT [phone]

## 2016-02-22 NOTE — Progress Notes (Signed)
Social work Theatre manager presented bed offers to patient's sons [name] and [name]. Patient's sons are preferring patient to go to [facility].Offer at [facility] is still pending at this time. Clinical Education officer, museum (CSW) is in contact with [facility] to see if they can take patient. Social work Theatre manager will continue to assist and follow as needed.   Danie Chandler, Social Work Intern  [phone]

## 2016-02-22 NOTE — Progress Notes (Signed)
Patient ID: Melissa Watkins, female   DOB: 03-08-1945, 71 y.o.   MRN: [id number]  [facility] PROGRESS NOTE  Melissa Watkins FMB:[id number] DOB: 08/22/1945 DOA: 02/21/2016 PCP: No PCP Per Patient  HPI/Subjective: Patient was asking when she can go home. Was sent in for low hemoglobin and high white count. Patient states that she is breathing okay. She does not see any blood coming from anywhere. Patient states that she doesn't feel the need to urinate or any lower abdominal pain.  Objective: Vitals:   02/22/16 1136 02/22/16 1200  BP: 134/77 134/77  Pulse: 82 82  Resp:  19  Temp:  97.8 F (36.6 C)    Filed Weights   02/21/16 1337 02/21/16 2238  Weight: 50.3 kg (111 lb) 54.5 kg (120 lb 3.2 oz)    ROS: Review of Systems  Constitutional: Negative for chills and fever.  Eyes: Negative for blurred vision.  Respiratory: Positive for cough and shortness of breath.   Cardiovascular: Negative for chest pain.  Gastrointestinal: Negative for abdominal pain, constipation, diarrhea, nausea and vomiting.  Genitourinary: Negative for dysuria.  Musculoskeletal: Negative for joint pain.  Neurological: Negative for dizziness and headaches.   Exam: Physical Exam  Constitutional: She is oriented to person, place, and time.  HENT:  Nose: No mucosal edema.  Mouth/Throat: No oropharyngeal exudate or posterior oropharyngeal edema.  Eyes: Conjunctivae, EOM and lids are normal. Pupils are equal, round, and reactive to light.  Neck: No JVD present. Carotid bruit is not present. No edema present. No thyroid mass and no thyromegaly present.  Cardiovascular: S1 normal and S2 normal.  Exam reveals no gallop.   No murmur heard. Pulses:      Dorsalis pedis pulses are 2+ on the right side, and 2+ on the left side.  Respiratory: No respiratory distress. She has decreased breath sounds in the right lower field and the left lower field. She has no wheezes. She has no rhonchi. She has no rales.  GI: Soft.  Bowel sounds are normal. There is no tenderness.  Musculoskeletal:       Right ankle: She exhibits swelling.       Left ankle: She exhibits swelling.  Lymphadenopathy:    She has no cervical adenopathy.  Neurological: She is alert and oriented to person, place, and time. No cranial nerve deficit.  Skin: Skin is warm. No rash noted. Nails show no clubbing.  Psychiatric: She has a normal mood and affect.      Data Reviewed: Basic Metabolic Panel:  Recent Labs Lab 02/21/16 1341 02/22/16 0424  NA 129* 135  K 4.8 4.1  CL 98* 103  CO2 19* 22  GLUCOSE 118* 83  BUN 86* 71*  CREATININE 1.83* 1.26*  CALCIUM 8.0* 7.7*   Liver Function Tests:  Recent Labs Lab 02/21/16 1341  AST 28  ALT 11*  ALKPHOS 92  BILITOT 0.3  PROT 6.6  ALBUMIN 2.6*   CBC:  Recent Labs Lab 02/21/16 1341 02/22/16 0424  WBC 26.7* 17.8*  NEUTROABS 24.1*  --   HGB 7.7* 8.3*  HCT 23.8* 24.6*  MCV 92.4 87.2  PLT 579* 426   Cardiac Enzymes:  Recent Labs Lab 02/21/16 2253 02/22/16 0424 02/22/16 1157  TROPONINI 0.04* <0.03 0.04*     Recent Results (from the past 240 hour(s))  Blood Culture (routine x 2)     Status: None (Preliminary result)   Collection Time: 02/21/16  1:41 PM  Result Value Ref Range Status   Specimen Description BLOOD LEFT  FA  Final   Special Requests   Final    BOTTLES DRAWN AEROBIC AND ANAEROBIC AER 10ML ANA 8ML   Culture NO GROWTH < 24 HOURS  Final   Report Status PENDING  Incomplete  Blood Culture (routine x 2)     Status: None (Preliminary result)   Collection Time: 02/21/16  1:41 PM  Result Value Ref Range Status   Specimen Description BLOOD RIGHT FA  Final   Special Requests   Final    BOTTLES DRAWN AEROBIC AND ANAEROBIC AER 9ML ANA 8ML   Culture NO GROWTH < 24 HOURS  Final   Report Status PENDING  Incomplete  Urine culture     Status: None   Collection Time: 02/21/16  2:49 PM  Result Value Ref Range Status   Specimen Description URINE, RANDOM  Final    Special Requests NONE  Final   Culture   Final    NO GROWTH Performed at [facility], [place] [address], [place], [place] [address]    Report Status 02/22/2016 FINAL  Final  MRSA PCR Screening     Status: None   Collection Time: 02/21/16  7:45 PM  Result Value Ref Range Status   MRSA by PCR NEGATIVE NEGATIVE Final    Comment:        The GeneXpert MRSA Assay (FDA approved for NASAL specimens only), is one component of a comprehensive MRSA colonization surveillance program. It is not intended to diagnose MRSA infection nor to guide or monitor treatment for MRSA infections.      Studies: Dg Chest 2 View  Result Date: 02/21/2016 CLINICAL DATA:  Leukocytosis. Currently being treated for pneumonia. Ex-smoker. EXAM: CHEST  2 VIEW COMPARISON:  02/07/2016 and thoracic spine dated 02/09/2016. FINDINGS: The cardiac silhouette remains borderline enlarged. The aorta remains tortuous and mildly calcified. Clear lungs with mildly prominent interstitial markings. Diffuse osteopenia. Multiple thoracic and thoracolumbar vertebral compression deformities without significant change since 02/09/2016. Moderate-sized hiatal hernia. IMPRESSION: 1. No acute abnormality. 2. Stable borderline cardiomegaly, aortic atherosclerosis, mild chronic interstitial lung disease and moderate-sized hiatal hernia. 3. Stable multiple thoracic and thoracolumbar vertebral compression deformities. Electronically Signed   By: Claudie Revering M.D.   On: 02/21/2016 16:27   Ct Chest Wo Contrast  Result Date: 02/21/2016 CLINICAL DATA:  Acute onset of fever, chills, leukocytosis, hypoxia and productive cough. Shortness of breath and generalized chest pain. Initial encounter. EXAM: CT CHEST WITHOUT CONTRAST TECHNIQUE: Multidetector CT imaging of the chest was performed following the standard protocol without IV contrast. COMPARISON:  Chest radiograph performed earlier today at 4:01 p.m. FINDINGS: Cardiovascular: Scattered coronary  artery calcifications are seen. The heart remains normal in size. Scattered calcification is noted along the aortic arch. The great vessels are grossly unremarkable in appearance. Mediastinum/Nodes: No mediastinal lymphadenopathy is seen. No pericardial effusion is identified. A moderate hiatal hernia is noted. The thyroid gland is grossly unremarkable. No axillary lymphadenopathy is seen. Lungs/Pleura: Trace bilateral pleural fluid is noted, with associated atelectasis. Bilateral emphysema is noted, more prominent at the upper lung lobes. No pneumothorax is seen. No masses are identified. Upper Abdomen: The visualized portions of the liver and spleen are grossly unremarkable. The patient is status post cholecystectomy, with clips noted at the gallbladder fossa. The visualized portions of the adrenal glands are within normal limits. Mild left-sided hydronephrosis is noted. Scattered hypodense and hyperdense bilateral renal cysts are partially imaged. A nonobstructing 7 mm stone is noted at the interpole region of the left kidney.  Musculoskeletal: No acute osseous abnormalities are identified. Chronic compression deformities are noted at T5, T7, T8, T10, T11 and L1. There is chronic marked deformity of the body of the sternum. The visualized musculature is unremarkable in appearance. IMPRESSION: 1. Trace bilateral pleural fluid, with associated atelectasis. 2. Bilateral emphysema, more prominent at the upper lung lobes. 3. Scattered coronary artery calcification. 4. Moderate hiatal hernia noted. 5. Mild left-sided hydronephrosis noted. Would correlate with the patient's symptoms. 6. Scattered bilateral renal cysts are partially imaged. Nonobstructing 7 mm at the interpole region of the left kidney. 7. Multiple chronic compression deformities along the thoracic and lower lumbar spine. Chronic marked deformity of the body of the sternum. Electronically Signed   By: Garald Balding M.D.   On: 02/21/2016 20:02   US  Renal  Result Date: 02/22/2016 CLINICAL DATA:  Hydronephrosis EXAM: RENAL / URINARY TRACT ULTRASOUND COMPLETE COMPARISON:  Chest CT 02/21/2016. FINDINGS: Right Kidney: Length: 12.2 cm. Multiple cysts within the right kidney, the largest in the upper pole measures 2.6 cm. Moderate right hydronephrosis. Left Kidney: Length: 9.8 cm. Multiple cysts, the largest off the lower pole measuring 6.0 cm. 7 mm nonobstructing stone in the midpole of the left kidney. There is mild to moderate left hydronephrosis. Bladder: Distended.  No bladder wall abnormality. IMPRESSION: Bilateral hydronephrosis, right greater than left. Distended urinary bladder. Electronically Signed   By: Rolm Baptise M.D.   On: 02/22/2016 11:30   US Venous Img Lower Bilateral  Result Date: 02/22/2016 CLINICAL DATA:  Lower extremity swelling. EXAM: BILATERAL LOWER EXTREMITY VENOUS DOPPLER ULTRASOUND TECHNIQUE: Gray-scale sonography with graded compression, as well as color Doppler and duplex ultrasound were performed to evaluate the lower extremity deep venous systems from the level of the common femoral vein and including the common femoral, femoral, profunda femoral, popliteal and calf veins including the posterior tibial, peroneal and gastrocnemius veins when visible. The superficial great saphenous vein was also interrogated. Spectral Doppler was utilized to evaluate flow at rest and with distal augmentation maneuvers in the common femoral, femoral and popliteal veins. COMPARISON:  No recent. FINDINGS: RIGHT LOWER EXTREMITY Common Femoral Vein: No evidence of thrombus. Normal compressibility, respiratory phasicity and response to augmentation. Saphenofemoral Junction: No evidence of thrombus. Normal compressibility and flow on color Doppler imaging. Profunda Femoral Vein: No evidence of thrombus. Normal compressibility and flow on color Doppler imaging. Femoral Vein: No evidence of thrombus. Normal compressibility, respiratory phasicity and  response to augmentation. Popliteal Vein: No evidence of thrombus. Normal compressibility, respiratory phasicity and response to augmentation. Calf Veins: No evidence of thrombus. Normal compressibility and flow on color Doppler imaging. Superficial Great Saphenous Vein: No evidence of thrombus. Normal compressibility and flow on color Doppler imaging. Other Findings:  None. LEFT LOWER EXTREMITY Common Femoral Vein: No evidence of thrombus. Normal compressibility, respiratory phasicity and response to augmentation. Saphenofemoral Junction: No evidence of thrombus. Normal compressibility and flow on color Doppler imaging. Profunda Femoral Vein: No evidence of thrombus. Normal compressibility and flow on color Doppler imaging. Femoral Vein: No evidence of thrombus. Normal compressibility, respiratory phasicity and response to augmentation. Popliteal Vein: No evidence of thrombus. Normal compressibility, respiratory phasicity and response to augmentation. Calf Veins: No evidence of thrombus. Normal compressibility and flow on color Doppler imaging. Superficial Great Saphenous Vein: No evidence of thrombus. Normal compressibility and flow on color Doppler imaging. Other Findings:  None. IMPRESSION: No evidence of deep venous thrombosis. Electronically Signed   By: Marcello Moores  Register   On: 02/22/2016 11:42  Scheduled Meds: . budesonide (PULMICORT) nebulizer solution  0.25 mg Nebulization BID  . cefTRIAXone  1 g Intravenous Q24H  . feeding supplement (ENSURE ENLIVE)  237 mL Oral BID BM  . ipratropium-albuterol  3 mL Nebulization Q6H  . metoprolol tartrate  25 mg Oral BID  . pantoprazole (PROTONIX) IV  40 mg Intravenous Q12H  . sodium chloride flush  3 mL Intravenous Q12H  . sodium chloride flush  3 mL Intravenous Q12H    Assessment/Plan:  1. Acute respiratory failure with hypoxia. This has resolved and now is on room air. 2. Acute bronchitis with leukocytosis. Switch antibiotics to Rocephin. CT scan  negative for pneumonia. Add budesonide nebulizers DuoNeb nebulizers 3. Bilateral hydronephrosis and bladder scan showing 999 mm in the bladder. Foley catheter placement. Urology consultation. Also nonobstructing stone. 4.   Acute anemia. GI would like to hold off on procedure until respiratory status better. Check another hemoglobin tomorrow. Patient received 1 unit of packed red blood cells. Hemoglobin 8.3 today. Check a ferritin level tomorrow morning. Patient on Protonix. 5. Essential hypertension on metoprolol 6. Acute kidney injury secondary to dehydration. Improved with blood transfusion. 7. Weakness. Physical therapy evaluation appreciated 8. Ultrasound of the lower extremities negative for DVT   Code Status:     Code Status Orders        Start     Ordered   02/21/16 2239  Full code  Continuous     02/21/16 2238    Code Status History    Date Active Date Inactive Code Status Order ID Comments User Context   02/07/2016  2:41 PM 02/07/2016  9:54 PM Full Code [id number]  Thornton Park, MD Inpatient   02/07/2016  5:42 AM 02/07/2016  2:41 PM Full Code [id number]  Saundra Shelling, MD Inpatient     Family Communication: Son at the bedside Disposition Plan: Back to facility soon.  Consultants:  Gastroenterology  Urology  Antibiotics:  Rocephin  Time spent: 28 minutes    [place], [place]

## 2016-02-22 NOTE — Consult Note (Addendum)
Consult: urinary retention  Requested by: Dr. Leslye Peer    History of Present Illness:  71 yo WF with hip fracture 2 weeks ago readmitted from SNF with complaints of recurrent fevers, chills, elevated white blood cell count, hypoxia, cough, green phlegm production. She underwent CT of chest which showed left hydronephrosis. A renal u/s was done which showed 2.2L in bladder and bilateral hydronephrosis. Bladder scan > 999 ml. She was in and out cathed. Her Cr has been around 1.2, but was 0.86 in 2015.   She is 2 weeks out from RIGHT HIP HEMIARTHROPLASTY and not ambulating on her own yet. She reports she has trouble voiding into a diaper while lying in the bed. They don't "do that in my culture". She typically has no voiding issues.   Past Medical History:  Diagnosis Date  . Back pain, chronic    Past Surgical History:  Procedure Laterality Date  . CHOLECYSTECTOMY    . COLONOSCOPY    . HIP ARTHROPLASTY Right 02/07/2016   Procedure: ARTHROPLASTY BIPOLAR HIP (HEMIARTHROPLASTY);  Surgeon: Thornton Park, MD;  Location: [facility] ORS;  Service: Orthopedics;  Laterality: Right;  . OOPHORECTOMY      Home Medications:  Prescriptions Prior to Admission  Medication Sig Dispense Refill Last Dose  . acetaminophen (TYLENOL) 500 MG tablet Take 500 mg by mouth every 12 (twelve) hours as needed.    02/20/2016 at 0803  . Acidophilus Lactobacillus CAPS Take 1 capsule by mouth daily. For 10 days   02/20/2016 at 2100  . aspirin 81 MG tablet Take 81 mg by mouth daily.   02/21/2016 at 0900  . enoxaparin (LOVENOX) 30 MG/0.3ML injection Inject 0.3 mLs (30 mg total) into the skin daily. 14 Syringe 0 02/21/2016 at 0900  . feeding supplement, ENSURE ENLIVE, (ENSURE ENLIVE) LIQD Take 237 mLs by mouth 2 (two) times daily between meals. 237 mL 12 02/20/2016 at 2000  . ferrous sulfate 325 (65 FE) MG tablet Take 1 tablet (325 mg total) by mouth 3 (three) times daily after meals. 90 tablet 3 02/21/2016 at 1300  .  HYDROcodone-acetaminophen (NORCO) 7.5-325 MG tablet Take 1-2 tablets by mouth every 4 (four) hours as needed for moderate pain. (Patient taking differently: Take 1 tablet by mouth every 4 (four) hours as needed for moderate pain. ) 30 tablet 0 02/14/2016 at 1231  . levofloxacin (LEVAQUIN) 500 MG tablet Take 500 mg by mouth daily.   02/20/2016 at 1600  . metoprolol tartrate (LOPRESSOR) 25 MG tablet Take 1 tablet (25 mg total) by mouth 2 (two) times daily. 60 tablet 2 02/21/2016 at 0900  . cephALEXin (KEFLEX) 250 MG capsule Take 1 capsule (250 mg total) by mouth every 12 (twelve) hours. (Patient not taking: Reported on 02/21/2016) 10 capsule 0 Completed Course at Unknown time   Allergies:  Allergies  Allergen Reactions  . Oxycodone-Acetaminophen Other (See Comments)    Hallucinations  . Valium [Diazepam]     Family History  Problem Relation Age of Onset  . Asthma Son    Social History:  reports that she has been smoking Cigarettes.  She has a 50.00 pack-year smoking history. She has never used smokeless tobacco. She reports that she does not drink alcohol or use drugs.  ROS: A complete review of systems was performed.  All systems are negative except for pertinent findings as noted. ROS   Physical Exam:  Vital signs in last 24 hours: Temp:  [97.7 F (36.5 C)-99 F (37.2 C)] 97.8 F (36.6 C) (01/23 1200)  Pulse Rate:  [76-92] 82 (01/23 1200) Resp:  [15-24] 19 (01/23 1200) BP: (118-159)/(46-90) 134/77 (01/23 1200) SpO2:  [82 %-100 %] 99 % (01/23 1200) FiO2 (%):  [28 %] 28 % (01/22 2239) Weight:  [54.5 kg (120 lb 3.2 oz)] 54.5 kg (120 lb 3.2 oz) (01/22 2238) General:  Alert and oriented, No acute distress -- PT was able to sit her up on the edge of the bed and do some leg extensions.  HEENT: Normocephalic, atraumatic Neck: No JVD or lymphadenopathy Cardiovascular: Regular rate and rhythm Lungs: Regular rate and effort Abdomen: Soft, nontender, nondistended, no abdominal masses Back:  No CVA tenderness Extremities: No edema Neurologic: Grossly intact  Laboratory Data:  Results for orders placed or performed during the hospital encounter of 02/21/16 (from the past 24 hour(s))  Lactic acid, plasma     Status: None   Collection Time: 02/21/16  1:46 PM  Result Value Ref Range   Lactic Acid, Venous 1.4 0.5 - 1.9 mmol/L  Urinalysis, Routine w reflex microscopic     Status: Abnormal   Collection Time: 02/21/16  2:49 PM  Result Value Ref Range   Color, Urine YELLOW (A) YELLOW   APPearance CLEAR (A) CLEAR   Specific Gravity, Urine 1.014 1.005 - 1.030   pH 5.0 5.0 - 8.0   Glucose, UA NEGATIVE NEGATIVE mg/dL   Hgb urine dipstick NEGATIVE NEGATIVE   Bilirubin Urine NEGATIVE NEGATIVE   Ketones, ur NEGATIVE NEGATIVE mg/dL   Protein, ur NEGATIVE NEGATIVE mg/dL   Nitrite NEGATIVE NEGATIVE   Leukocytes, UA SMALL (A) NEGATIVE   RBC / HPF 0-5 0 - 5 RBC/hpf   WBC, UA 6-30 0 - 5 WBC/hpf   Bacteria, UA RARE (A) NONE SEEN   Squamous Epithelial / LPF 0-5 (A) NONE SEEN   Budding Yeast PRESENT   Influenza panel by PCR (type A & B)     Status: None   Collection Time: 02/21/16  3:55 PM  Result Value Ref Range   Influenza A By PCR NEGATIVE NEGATIVE   Influenza B By PCR NEGATIVE NEGATIVE  MRSA PCR Screening     Status: None   Collection Time: 02/21/16  7:45 PM  Result Value Ref Range   MRSA by PCR NEGATIVE NEGATIVE  Prepare RBC     Status: None   Collection Time: 02/21/16  8:00 PM  Result Value Ref Range   Order Confirmation ORDER PROCESSED BY BLOOD BANK   Type and screen [place]     Status: None   Collection Time: 02/21/16  9:01 PM  Result Value Ref Range   ABO/RH(D) O POS    Antibody Screen NEG    Sample Expiration 02/24/2016    Unit Number [id number]    Blood Component Type RBC LR PHER1    Unit division 00    Status of Unit ISSUED,FINAL    Transfusion Status OK TO TRANSFUSE    Crossmatch Result Compatible   Troponin I     Status: Abnormal    Collection Time: 02/21/16 10:53 PM  Result Value Ref Range   Troponin I 0.04 (HH) <0.03 ng/mL  Troponin I     Status: None   Collection Time: 02/22/16  4:24 AM  Result Value Ref Range   Troponin I <0.03 <0.03 ng/mL  Basic metabolic panel     Status: Abnormal   Collection Time: 02/22/16  4:24 AM  Result Value Ref Range   Sodium 135 135 - 145 mmol/L   Potassium 4.1  3.5 - 5.1 mmol/L   Chloride 103 101 - 111 mmol/L   CO2 22 22 - 32 mmol/L   Glucose, Bld 83 65 - 99 mg/dL   BUN 71 (H) 6 - 20 mg/dL   Creatinine, Ser 1.26 (H) 0.44 - 1.00 mg/dL   Calcium 7.7 (L) 8.9 - 10.3 mg/dL   GFR calc non Af Amer 42 (L) >60 mL/min   GFR calc Af Amer 49 (L) >60 mL/min   Anion gap 10 5 - 15  CBC     Status: Abnormal   Collection Time: 02/22/16  4:24 AM  Result Value Ref Range   WBC 17.8 (H) 3.6 - 11.0 K/uL   RBC 2.82 (L) 3.80 - 5.20 MIL/uL   Hemoglobin 8.3 (L) 12.0 - 16.0 g/dL   HCT 24.6 (L) 35.0 - 47.0 %   MCV 87.2 80.0 - 100.0 fL   MCH 29.4 26.0 - 34.0 pg   MCHC 33.7 32.0 - 36.0 g/dL   RDW 21.1 (H) 11.5 - 14.5 %   Platelets 426 150 - 440 K/uL  Troponin I     Status: Abnormal   Collection Time: 02/22/16 11:57 AM  Result Value Ref Range   Troponin I 0.04 (HH) <0.03 ng/mL   Recent Results (from the past 240 hour(s))  Blood Culture (routine x 2)     Status: None (Preliminary result)   Collection Time: 02/21/16  1:41 PM  Result Value Ref Range Status   Specimen Description BLOOD LEFT FA  Final   Special Requests   Final    BOTTLES DRAWN AEROBIC AND ANAEROBIC AER 10ML ANA 8ML   Culture NO GROWTH < 24 HOURS  Final   Report Status PENDING  Incomplete  Blood Culture (routine x 2)     Status: None (Preliminary result)   Collection Time: 02/21/16  1:41 PM  Result Value Ref Range Status   Specimen Description BLOOD RIGHT FA  Final   Special Requests   Final    BOTTLES DRAWN AEROBIC AND ANAEROBIC AER 9ML ANA 8ML   Culture NO GROWTH < 24 HOURS  Final   Report Status PENDING  Incomplete  MRSA PCR  Screening     Status: None   Collection Time: 02/21/16  7:45 PM  Result Value Ref Range Status   MRSA by PCR NEGATIVE NEGATIVE Final    Comment:        The GeneXpert MRSA Assay (FDA approved for NASAL specimens only), is one component of a comprehensive MRSA colonization surveillance program. It is not intended to diagnose MRSA infection nor to guide or monitor treatment for MRSA infections.    Creatinine:  Recent Labs  02/21/16 1341 02/22/16 0424  CREATININE 1.83* 1.26*    Impression/Assessment/plan: -bilateral hydronephrosis - likely from high volume urinary retention. I don't think she needs other imaging at this point.   -urinary retention - multifactorial with decreased mobility and functional/pt holding urine. I discussed with patient and her sons the u/s findings. We discussed the nature r/b of continued surveillance, CIC or indwelling foley. She elects CIC, so we'll start with bladder scan q shift and CIC if PVR > 400 ml.   Arne Schlender 02/22/2016, 1:42 PM

## 2016-02-23 DIAGNOSIS — A419 Sepsis, unspecified organism: Principal | ICD-10-CM

## 2016-02-23 DIAGNOSIS — R609 Edema, unspecified: Secondary | ICD-10-CM

## 2016-02-23 DIAGNOSIS — J9601 Acute respiratory failure with hypoxia: Secondary | ICD-10-CM

## 2016-02-23 DIAGNOSIS — N1330 Unspecified hydronephrosis: Secondary | ICD-10-CM

## 2016-02-23 DIAGNOSIS — M6281 Muscle weakness (generalized): Secondary | ICD-10-CM

## 2016-02-23 DIAGNOSIS — D649 Anemia, unspecified: Secondary | ICD-10-CM

## 2016-02-23 DIAGNOSIS — R2681 Unsteadiness on feet: Secondary | ICD-10-CM

## 2016-02-23 LAB — BASIC METABOLIC PANEL
ANION GAP: 6 (ref 5–15)
BUN: 52 mg/dL — ABNORMAL HIGH (ref 6–20)
CALCIUM: 7.7 mg/dL — AB (ref 8.9–10.3)
CO2: 25 mmol/L (ref 22–32)
CREATININE: 0.98 mg/dL (ref 0.44–1.00)
Chloride: 105 mmol/L (ref 101–111)
GFR, EST NON AFRICAN AMERICAN: 58 mL/min — AB (ref >60)
Glucose, Bld: 78 mg/dL (ref 65–99)
Potassium: 4.2 mmol/L (ref 3.5–5.1)
Sodium: 136 mmol/L (ref 135–145)

## 2016-02-23 LAB — CBC
HCT: 24.4 % — ABNORMAL LOW (ref 35.0–47.0)
Hemoglobin: 8.2 g/dL — ABNORMAL LOW (ref 12.0–16.0)
MCH: 29.3 pg (ref 26.0–34.0)
MCHC: 33.7 g/dL (ref 32.0–36.0)
MCV: 87 fL (ref 80.0–100.0)
PLATELETS: 383 10*3/uL (ref 150–440)
RBC: 2.81 MIL/uL — ABNORMAL LOW (ref 3.80–5.20)
RDW: 21 % — AB (ref 11.5–14.5)
WBC: 12.1 10*3/uL — AB (ref 3.6–11.0)

## 2016-02-23 LAB — FERRITIN: Ferritin: 63 ng/mL (ref 11–307)

## 2016-02-23 LAB — PROCALCITONIN: PROCALCITONIN: 0.21 ng/mL

## 2016-02-23 MED ORDER — CEFTRIAXONE SODIUM-DEXTROSE 1-3.74 GM-% IV SOLR
1.0000 g | INTRAVENOUS | Status: DC
  Administered 2016-02-23: 1 g via INTRAVENOUS
  Filled 2016-02-23 (×3): qty 50

## 2016-02-23 MED ORDER — IPRATROPIUM-ALBUTEROL 0.5-2.5 (3) MG/3ML IN SOLN
3.0000 mL | Freq: Three times a day (TID) | RESPIRATORY_TRACT | Status: DC
  Administered 2016-02-24 – 2016-02-25 (×4): 3 mL via RESPIRATORY_TRACT
  Filled 2016-02-23 (×4): qty 3

## 2016-02-23 NOTE — Progress Notes (Signed)
Physical Therapy Treatment Patient Details Name: Melissa Watkins MRN: [id number] DOB: 11/27/1945 Today's Date: 02/23/2016    History of Present Illness Pt is a 71 y/o F with a known history of recent diagnosis of right femoral neck fracture status post right hip hemiarthroplasty 02/07/2016 who returns to the hospital with c/o fevers, chills, hypoxia, and cough.  Pt admitted with respiratory failure likely secondary to bronchitis, drop in Hgb, and melena. Pt's PMH includes chronic back pain.    PT Comments    Ms. Voight requires encouragement to participate in therapy but is agreeable.  Reviewed hip precautions with pt as she was unable to recall any precautions at start of session. Mod assist provided to stand from bed and to pivot to chair and pt extremely anxious and screaming out "I'm going to fall!" even at end of session when pt was sitting in the chair.  She needs step by step sequencing cues for which foot to advance next when pivoting.  SNF remains most appropriate d/c plan.  Pt will benefit from continued skilled PT services to increase functional independence and safety.   Follow Up Recommendations  SNF     Equipment Recommendations  None recommended by PT    Recommendations for Other Services OT consult     Precautions / Restrictions Precautions Precautions: Fall;Posterior Hip Precaution Comments: Posterior Hip precautions.  Pt unable to recall any hip preautions, these were reveiewed with the pt and son. Restrictions Weight Bearing Restrictions: Yes RLE Weight Bearing: Weight bearing as tolerated    Mobility  Bed Mobility Overal bed mobility: Needs Assistance Bed Mobility: Supine to Sit     Supine to sit: HOB elevated;Min assist     General bed mobility comments: Pt able to achieve sitting with increased time and effort as well as cues for sequencing.  Used bed pad to assist pt in scooting to EOB.  Transfers Overall transfer level: Needs assistance Equipment  used: Rolling walker (2 wheeled) Transfers: Sit to/from [facility] Sit to Stand: Mod assist Stand pivot transfers: Mod assist       General transfer comment: Cues for hand placement and technique.  Pt requires mod assist to boost to standing and cues for upright posture.  When pivoting pt requires mod assist to prevent posterior lean and assist with management of RW.  Pt very anxious and screaming out "I'm going to fall!" even at end of session when pt was sitting in the chair.  She needs step by step sequencing cues for which foot to advance next when pivoting.  Ambulation/Gait             General Gait Details: unable to attempt at this time   Stairs            Wheelchair Mobility    Modified Rankin (Stroke Patients Only)       Balance Overall balance assessment: Needs assistance Sitting-balance support: Feet supported;Single extremity supported Sitting balance-Leahy Scale: Poor Sitting balance - Comments: Pt relies on at least 1UE support while seated EOB Postural control: Posterior lean Standing balance support: Bilateral upper extremity supported;During functional activity Standing balance-Leahy Scale: Poor Standing balance comment: Pt relies on RW and outside physical assist to steady                    Cognition Arousal/Alertness: Awake/alert Behavior During Therapy: Anxious Overall Cognitive Status: Impaired/Different from baseline Area of Impairment: Safety/judgement;Problem solving;Attention   Current Attention Level: Selective     Safety/Judgement: Decreased awareness  of safety;Decreased awareness of deficits   Problem Solving: Slow processing;Difficulty sequencing;Requires verbal cues;Requires tactile cues General Comments: Repeating "I need to pee" throughout session despite being told that she has a catheter.  Pt voids and then repeats "I need to pee".     Exercises General Exercises - Lower Extremity Ankle  Circles/Pumps: AROM;Both;10 reps;Supine Quad Sets: Strengthening;Both;10 reps;Supine Gluteal Sets: Strengthening;Both;10 reps;Supine Hip ABduction/ADduction: Strengthening;AAROM;Right;10 reps;Supine    General Comments General comments (skin integrity, edema, etc.): Son present throughout session.      Pertinent Vitals/Pain Pain Assessment: Faces Faces Pain Scale: Hurts little more Pain Location: Right Hip Pain Descriptors / Indicators: Guarding;Moaning Pain Intervention(s): Limited activity within patient's tolerance;Monitored during session;Repositioned    Home Living                      Prior Function            PT Goals (current goals can now be found in the care plan section) Acute Rehab PT Goals Patient Stated Goal: none stated PT Goal Formulation: With patient Time For Goal Achievement: 03/07/16 Potential to Achieve Goals: Fair Progress towards PT goals: Progressing toward goals (modestly)    Frequency    7X/week      PT Plan Current plan remains appropriate    Co-evaluation             End of Session Equipment Utilized During Treatment: Gait belt Activity Tolerance: Patient limited by fatigue;Patient limited by pain;Other (comment) (limited by anxiety about falling) Patient left: with call bell/phone within reach;in chair;with chair alarm set;with family/visitor present     Time: 9323-5573 PT Time Calculation (min) (ACUTE ONLY): 27 min  Charges:  $Therapeutic Exercise: 8-22 mins $Therapeutic Activity: 8-22 mins                    G Codes:       Collie Siad PT, DPT 02/23/2016, 4:30 PM

## 2016-02-23 NOTE — Progress Notes (Signed)
Urology Consult Follow Up  Subjective: Patient with no urinary complaints this morning.  Foley in place, draining clear yellow urine.  Good UOP.  Creatinine improving, down to 0.98 from 1.26 yesterday.    Anti-infectives: Anti-infectives    Start     Dose/Rate Route Frequency Ordered Stop   02/23/16 0400  vancomycin (VANCOCIN) IVPB 750 mg/150 ml premix  Status:  Discontinued     750 mg 150 mL/hr over 60 Minutes Intravenous Every 36 hours 02/21/16 1937 02/22/16 1146   02/22/16 1800  ceFEPIme (MAXIPIME) 1 GM / 80m IVPB premix  Status:  Discontinued     1 g 100 mL/hr over 30 Minutes Intravenous Every 24 hours 02/21/16 1937 02/22/16 0822   02/22/16 1300  cefTRIAXone (ROCEPHIN) IVPB 1 g     1 g 100 mL/hr over 30 Minutes Intravenous Every 24 hours 02/22/16 1205     02/22/16 1215  cefTRIAXone (ROCEPHIN) 1 g in dextrose 5 % 50 mL IVPB  Status:  Discontinued     1 g 100 mL/hr over 30 Minutes Intravenous Every 24 hours 02/22/16 1202 02/22/16 1205   02/22/16 1000  ceFEPIme (MAXIPIME) 2 g in dextrose 5 % 50 mL IVPB  Status:  Discontinued     2 g 100 mL/hr over 30 Minutes Intravenous Every 12 hours 02/22/16 0822 02/22/16 1202   02/21/16 1545  ceFEPIme (MAXIPIME) 2 GM / 556mIVPB premix     2 g 100 mL/hr over 30 Minutes Intravenous STAT 02/21/16 1542 02/21/16 1633   02/21/16 1545  vancomycin (VANCOCIN) IVPB 1000 mg/200 mL premix     1,000 mg 200 mL/hr over 60 Minutes Intravenous STAT 02/21/16 1542 02/21/16 1824      Current Facility-Administered Medications  Medication Dose Route Frequency Provider Last Rate Last Dose  . 0.9 %  sodium chloride infusion  250 mL Intravenous PRN RiTheodoro GristMD      . acetaminophen (TYLENOL) tablet 500 mg  500 mg Oral Q6H PRN RiTheodoro GristMD      . budesonide (PULMICORT) nebulizer solution 0.25 mg  0.25 mg Nebulization BID RiLoletha GrayerMD   0.25 mg at 02/22/16 2129  . cefTRIAXone (ROCEPHIN) IVPB 1 g  1 g Intravenous Q24H RiLoletha GrayerMD   1 g at  02/22/16 1443  . feeding supplement (ENSURE ENLIVE) (ENSURE ENLIVE) liquid 237 mL  237 mL Oral BID BM RiTheodoro GristMD   237 mL at 02/22/16 1442  . HYDROcodone-acetaminophen (NORCO) 7.5-325 MG per tablet 1 tablet  1 tablet Oral Q4H PRN RiTheodoro GristMD   1 tablet at 02/23/16 0418  . ipratropium-albuterol (DUONEB) 0.5-2.5 (3) MG/3ML nebulizer solution 3 mL  3 mL Nebulization Q6H RiLoletha GrayerMD   3 mL at 02/23/16 0146  . metoprolol tartrate (LOPRESSOR) tablet 25 mg  25 mg Oral BID RiTheodoro GristMD   25 mg at 02/22/16 2133  . ondansetron (ZOFRAN) tablet 4 mg  4 mg Oral Q6H PRN RiTheodoro GristMD       Or  . ondansetron (ZOFRAN) injection 4 mg  4 mg Intravenous Q6H PRN RiTheodoro GristMD      . pantoprazole (PROTONIX) injection 40 mg  40 mg Intravenous Q12H RiTheodoro GristMD   40 mg at 02/22/16 2116  . sodium chloride flush (NS) 0.9 % injection 3 mL  3 mL Intravenous Q12H RiTheodoro GristMD   3 mL at 02/22/16 2116  . sodium chloride flush (NS) 0.9 % injection 3 mL  3 mL Intravenous  Strang, MD   3 mL at 02/22/16 2116  . sodium chloride flush (NS) 0.9 % injection 3 mL  3 mL Intravenous PRN Theodoro Grist, MD         Objective: Vital signs in last 24 hours: Temp:  [97.7 F (36.5 C)-98.4 F (36.9 C)] 98.4 F (36.9 C) (01/24 0411) Pulse Rate:  [75-93] 75 (01/24 0411) Resp:  [16-20] 20 (01/24 0411) BP: (128-155)/(49-77) 128/49 (01/24 0411) SpO2:  [94 %-100 %] 98 % (01/24 0411)  Intake/Output from previous day: 01/23 0701 - 01/24 0700 In: 290 [P.O.:240; IV Piggyback:50] Out: 4097 [Urine:3750; Stool:1] Intake/Output this shift: No intake/output data recorded.   Physical Exam  Lab Results:   Recent Labs  02/22/16 0424 02/23/16 0352  WBC 17.8* 12.1*  HGB 8.3* 8.2*  HCT 24.6* 24.4*  PLT 426 383   BMET  Recent Labs  02/22/16 0424 02/23/16 0352  NA 135 136  K 4.1 4.2  CL 103 105  CO2 22 25  GLUCOSE 83 78  BUN 71* 52*  CREATININE 1.26* 0.98  CALCIUM 7.7* 7.7*    PT/INR No results for input(s): LABPROT, INR in the last 72 hours. ABG No results for input(s): PHART, HCO3 in the last 72 hours.  Invalid input(s): PCO2, PO2  Studies/Results: Dg Chest 2 View  Result Date: 02/21/2016 CLINICAL DATA:  Leukocytosis. Currently being treated for pneumonia. Ex-smoker. EXAM: CHEST  2 VIEW COMPARISON:  02/07/2016 and thoracic spine dated 02/09/2016. FINDINGS: The cardiac silhouette remains borderline enlarged. The aorta remains tortuous and mildly calcified. Clear lungs with mildly prominent interstitial markings. Diffuse osteopenia. Multiple thoracic and thoracolumbar vertebral compression deformities without significant change since 02/09/2016. Moderate-sized hiatal hernia. IMPRESSION: 1. No acute abnormality. 2. Stable borderline cardiomegaly, aortic atherosclerosis, mild chronic interstitial lung disease and moderate-sized hiatal hernia. 3. Stable multiple thoracic and thoracolumbar vertebral compression deformities. Electronically Signed   By: Claudie Revering M.D.   On: 02/21/2016 16:27   Ct Chest Wo Contrast  Result Date: 02/21/2016 CLINICAL DATA:  Acute onset of fever, chills, leukocytosis, hypoxia and productive cough. Shortness of breath and generalized chest pain. Initial encounter. EXAM: CT CHEST WITHOUT CONTRAST TECHNIQUE: Multidetector CT imaging of the chest was performed following the standard protocol without IV contrast. COMPARISON:  Chest radiograph performed earlier today at 4:01 p.m. FINDINGS: Cardiovascular: Scattered coronary artery calcifications are seen. The heart remains normal in size. Scattered calcification is noted along the aortic arch. The great vessels are grossly unremarkable in appearance. Mediastinum/Nodes: No mediastinal lymphadenopathy is seen. No pericardial effusion is identified. A moderate hiatal hernia is noted. The thyroid gland is grossly unremarkable. No axillary lymphadenopathy is seen. Lungs/Pleura: Trace bilateral pleural  fluid is noted, with associated atelectasis. Bilateral emphysema is noted, more prominent at the upper lung lobes. No pneumothorax is seen. No masses are identified. Upper Abdomen: The visualized portions of the liver and spleen are grossly unremarkable. The patient is status post cholecystectomy, with clips noted at the gallbladder fossa. The visualized portions of the adrenal glands are within normal limits. Mild left-sided hydronephrosis is noted. Scattered hypodense and hyperdense bilateral renal cysts are partially imaged. A nonobstructing 7 mm stone is noted at the interpole region of the left kidney. Musculoskeletal: No acute osseous abnormalities are identified. Chronic compression deformities are noted at T5, T7, T8, T10, T11 and L1. There is chronic marked deformity of the body of the sternum. The visualized musculature is unremarkable in appearance. IMPRESSION: 1. Trace bilateral pleural fluid, with associated atelectasis. 2.  Bilateral emphysema, more prominent at the upper lung lobes. 3. Scattered coronary artery calcification. 4. Moderate hiatal hernia noted. 5. Mild left-sided hydronephrosis noted. Would correlate with the patient's symptoms. 6. Scattered bilateral renal cysts are partially imaged. Nonobstructing 7 mm at the interpole region of the left kidney. 7. Multiple chronic compression deformities along the thoracic and lower lumbar spine. Chronic marked deformity of the body of the sternum. Electronically Signed   By: Garald Balding M.D.   On: 02/21/2016 20:02   US Renal  Result Date: 02/22/2016 CLINICAL DATA:  Hydronephrosis EXAM: RENAL / URINARY TRACT ULTRASOUND COMPLETE COMPARISON:  Chest CT 02/21/2016. FINDINGS: Right Kidney: Length: 12.2 cm. Multiple cysts within the right kidney, the largest in the upper pole measures 2.6 cm. Moderate right hydronephrosis. Left Kidney: Length: 9.8 cm. Multiple cysts, the largest off the lower pole measuring 6.0 cm. 7 mm nonobstructing stone in the  midpole of the left kidney. There is mild to moderate left hydronephrosis. Bladder: Distended.  No bladder wall abnormality. IMPRESSION: Bilateral hydronephrosis, right greater than left. Distended urinary bladder. Electronically Signed   By: Rolm Baptise M.D.   On: 02/22/2016 11:30   US Venous Img Lower Bilateral  Result Date: 02/22/2016 CLINICAL DATA:  Lower extremity swelling. EXAM: BILATERAL LOWER EXTREMITY VENOUS DOPPLER ULTRASOUND TECHNIQUE: Gray-scale sonography with graded compression, as well as color Doppler and duplex ultrasound were performed to evaluate the lower extremity deep venous systems from the level of the common femoral vein and including the common femoral, femoral, profunda femoral, popliteal and calf veins including the posterior tibial, peroneal and gastrocnemius veins when visible. The superficial great saphenous vein was also interrogated. Spectral Doppler was utilized to evaluate flow at rest and with distal augmentation maneuvers in the common femoral, femoral and popliteal veins. COMPARISON:  No recent. FINDINGS: RIGHT LOWER EXTREMITY Common Femoral Vein: No evidence of thrombus. Normal compressibility, respiratory phasicity and response to augmentation. Saphenofemoral Junction: No evidence of thrombus. Normal compressibility and flow on color Doppler imaging. Profunda Femoral Vein: No evidence of thrombus. Normal compressibility and flow on color Doppler imaging. Femoral Vein: No evidence of thrombus. Normal compressibility, respiratory phasicity and response to augmentation. Popliteal Vein: No evidence of thrombus. Normal compressibility, respiratory phasicity and response to augmentation. Calf Veins: No evidence of thrombus. Normal compressibility and flow on color Doppler imaging. Superficial Great Saphenous Vein: No evidence of thrombus. Normal compressibility and flow on color Doppler imaging. Other Findings:  None. LEFT LOWER EXTREMITY Common Femoral Vein: No evidence of  thrombus. Normal compressibility, respiratory phasicity and response to augmentation. Saphenofemoral Junction: No evidence of thrombus. Normal compressibility and flow on color Doppler imaging. Profunda Femoral Vein: No evidence of thrombus. Normal compressibility and flow on color Doppler imaging. Femoral Vein: No evidence of thrombus. Normal compressibility, respiratory phasicity and response to augmentation. Popliteal Vein: No evidence of thrombus. Normal compressibility, respiratory phasicity and response to augmentation. Calf Veins: No evidence of thrombus. Normal compressibility and flow on color Doppler imaging. Superficial Great Saphenous Vein: No evidence of thrombus. Normal compressibility and flow on color Doppler imaging. Other Findings:  None. IMPRESSION: No evidence of deep venous thrombosis. Electronically Signed   By: Marcello Moores  Register   On: 02/22/2016 11:42     Assessment and Plan  1. Bilateral hydronephrosis  - Most likely from high volume urinary retention  - Patient currently with indwelling Foley  - Serum creatinine is improved  2. Urinary retention  - Multifactorial with decreased mobility and functional with patient holding urine  -  Suggest continuing Foley until patient is ambulatory   - Urology to sign off - suggest follow up 1 to 2 weeks after discharge for evaluation for voiding trial    LOS: 2 days    [facility] 02/23/2016

## 2016-02-23 NOTE — Progress Notes (Signed)
Patient ID: Melissa Watkins, female   DOB: Apr 01, 1945, 71 y.o.   MRN: [id number]  [facility] PROGRESS NOTE  Melissa Watkins MWU:[id number] DOB: [birth year] DOA: 02/21/2016 PCP: No PCP Per Patient  HPI/Subjective: Patient feels good today, denies any shortness of breath, no bleeding. The hemoglobin level remains stable.  the patient is on room air now, oxygen saturations are 100%. Gastroenterologist is considering EGD tomorrow, nothing by mouth after midnight.     Objective: Vitals:   02/23/16 0752 02/23/16 1354  BP: (!) 163/65 (!) 125/53  Pulse: 79 85  Resp:  18  Temp: 97.5 F (36.4 C) 98.1 F (36.7 C)    Filed Weights   02/21/16 1337 02/21/16 2238  Weight: 50.3 kg (111 lb) 54.5 kg (120 lb 3.2 oz)    ROS: Review of Systems  Constitutional: Negative for chills and fever.  Eyes: Negative for blurred vision.  Respiratory: Negative for cough and shortness of breath.   Cardiovascular: Negative for chest pain.  Gastrointestinal: Negative for abdominal pain, constipation, diarrhea, nausea and vomiting.  Genitourinary: Negative for dysuria.  Musculoskeletal: Negative for joint pain.  Neurological: Negative for dizziness and headaches.   Exam: Physical Exam  Constitutional: She is oriented to person, place, and time.  HENT:  Nose: No mucosal edema.  Mouth/Throat: No oropharyngeal exudate or posterior oropharyngeal edema.  Eyes: Conjunctivae, EOM and lids are normal. Pupils are equal, round, and reactive to light.  Neck: No JVD present. Carotid bruit is not present. No edema present. No thyroid mass and no thyromegaly present.  Cardiovascular: S1 normal and S2 normal.  Exam reveals no gallop.   No murmur heard. Pulses:      Dorsalis pedis pulses are 2+ on the right side, and 2+ on the left side.  Respiratory: No respiratory distress. She has decreased breath sounds in the right lower field and the left lower field. She has no wheezes. She has no rhonchi. She has no rales.   GI: Soft. Bowel sounds are normal. There is no tenderness.  Musculoskeletal:       Right ankle: She exhibits swelling.       Left ankle: She exhibits swelling.  Lymphadenopathy:    She has no cervical adenopathy.  Neurological: She is alert and oriented to person, place, and time. No cranial nerve deficit.  Skin: Skin is warm. No rash noted. Nails show no clubbing.  Psychiatric: She has a normal mood and affect.      Data Reviewed: Basic Metabolic Panel:  Recent Labs Lab 02/21/16 1341 02/22/16 0424 02/23/16 0352  NA 129* 135 136  K 4.8 4.1 4.2  CL 98* 103 105  CO2 19* 22 25  GLUCOSE 118* 83 78  BUN 86* 71* 52*  CREATININE 1.83* 1.26* 0.98  CALCIUM 8.0* 7.7* 7.7*   Liver Function Tests:  Recent Labs Lab 02/21/16 1341  AST 28  ALT 11*  ALKPHOS 92  BILITOT 0.3  PROT 6.6  ALBUMIN 2.6*   CBC:  Recent Labs Lab 02/21/16 1341 02/22/16 0424 02/23/16 0352  WBC 26.7* 17.8* 12.1*  NEUTROABS 24.1*  --   --   HGB 7.7* 8.3* 8.2*  HCT 23.8* 24.6* 24.4*  MCV 92.4 87.2 87.0  PLT 579* 426 383   Cardiac Enzymes:  Recent Labs Lab 02/21/16 2253 02/22/16 0424 02/22/16 1157  TROPONINI 0.04* <0.03 0.04*     Recent Results (from the past 240 hour(s))  Blood Culture (routine x 2)     Status: None (Preliminary result)   Collection  Time: 02/21/16  1:41 PM  Result Value Ref Range Status   Specimen Description BLOOD LEFT FA  Final   Special Requests   Final    BOTTLES DRAWN AEROBIC AND ANAEROBIC AER 10ML ANA 8ML   Culture NO GROWTH 2 DAYS  Final   Report Status PENDING  Incomplete  Blood Culture (routine x 2)     Status: None (Preliminary result)   Collection Time: 02/21/16  1:41 PM  Result Value Ref Range Status   Specimen Description BLOOD RIGHT FA  Final   Special Requests   Final    BOTTLES DRAWN AEROBIC AND ANAEROBIC AER 9ML ANA 8ML   Culture NO GROWTH 2 DAYS  Final   Report Status PENDING  Incomplete  Urine culture     Status: None   Collection Time:  02/21/16  2:49 PM  Result Value Ref Range Status   Specimen Description URINE, RANDOM  Final   Special Requests NONE  Final   Culture   Final    NO GROWTH Performed at [facility], [place] [address] [place], [place] [address]    Report Status 02/22/2016 FINAL  Final  MRSA PCR Screening     Status: None   Collection Time: 02/21/16  7:45 PM  Result Value Ref Range Status   MRSA by PCR NEGATIVE NEGATIVE Final    Comment:        The GeneXpert MRSA Assay (FDA approved for NASAL specimens only), is one component of a comprehensive MRSA colonization surveillance program. It is not intended to diagnose MRSA infection nor to guide or monitor treatment for MRSA infections.      Studies: Ct Chest Wo Contrast  Result Date: 02/21/2016 CLINICAL DATA:  Acute onset of fever, chills, leukocytosis, hypoxia and productive cough. Shortness of breath and generalized chest pain. Initial encounter. EXAM: CT CHEST WITHOUT CONTRAST TECHNIQUE: Multidetector CT imaging of the chest was performed following the standard protocol without IV contrast. COMPARISON:  Chest radiograph performed earlier today at 4:01 p.m. FINDINGS: Cardiovascular: Scattered coronary artery calcifications are seen. The heart remains normal in size. Scattered calcification is noted along the aortic arch. The great vessels are grossly unremarkable in appearance. Mediastinum/Nodes: No mediastinal lymphadenopathy is seen. No pericardial effusion is identified. A moderate hiatal hernia is noted. The thyroid gland is grossly unremarkable. No axillary lymphadenopathy is seen. Lungs/Pleura: Trace bilateral pleural fluid is noted, with associated atelectasis. Bilateral emphysema is noted, more prominent at the upper lung lobes. No pneumothorax is seen. No masses are identified. Upper Abdomen: The visualized portions of the liver and spleen are grossly unremarkable. The patient is status post cholecystectomy, with clips noted at the gallbladder  fossa. The visualized portions of the adrenal glands are within normal limits. Mild left-sided hydronephrosis is noted. Scattered hypodense and hyperdense bilateral renal cysts are partially imaged. A nonobstructing 7 mm stone is noted at the interpole region of the left kidney. Musculoskeletal: No acute osseous abnormalities are identified. Chronic compression deformities are noted at T5, T7, T8, T10, T11 and L1. There is chronic marked deformity of the body of the sternum. The visualized musculature is unremarkable in appearance. IMPRESSION: 1. Trace bilateral pleural fluid, with associated atelectasis. 2. Bilateral emphysema, more prominent at the upper lung lobes. 3. Scattered coronary artery calcification. 4. Moderate hiatal hernia noted. 5. Mild left-sided hydronephrosis noted. Would correlate with the patient's symptoms. 6. Scattered bilateral renal cysts are partially imaged. Nonobstructing 7 mm at the interpole region of the left kidney. 7. Multiple  chronic compression deformities along the thoracic and lower lumbar spine. Chronic marked deformity of the body of the sternum. Electronically Signed   By: Garald Balding M.D.   On: 02/21/2016 20:02   US Renal  Result Date: 02/22/2016 CLINICAL DATA:  Hydronephrosis EXAM: RENAL / URINARY TRACT ULTRASOUND COMPLETE COMPARISON:  Chest CT 02/21/2016. FINDINGS: Right Kidney: Length: 12.2 cm. Multiple cysts within the right kidney, the largest in the upper pole measures 2.6 cm. Moderate right hydronephrosis. Left Kidney: Length: 9.8 cm. Multiple cysts, the largest off the lower pole measuring 6.0 cm. 7 mm nonobstructing stone in the midpole of the left kidney. There is mild to moderate left hydronephrosis. Bladder: Distended.  No bladder wall abnormality. IMPRESSION: Bilateral hydronephrosis, right greater than left. Distended urinary bladder. Electronically Signed   By: Rolm Baptise M.D.   On: 02/22/2016 11:30   US Venous Img Lower Bilateral  Result Date:  02/22/2016 CLINICAL DATA:  Lower extremity swelling. EXAM: BILATERAL LOWER EXTREMITY VENOUS DOPPLER ULTRASOUND TECHNIQUE: Gray-scale sonography with graded compression, as well as color Doppler and duplex ultrasound were performed to evaluate the lower extremity deep venous systems from the level of the common femoral vein and including the common femoral, femoral, profunda femoral, popliteal and calf veins including the posterior tibial, peroneal and gastrocnemius veins when visible. The superficial great saphenous vein was also interrogated. Spectral Doppler was utilized to evaluate flow at rest and with distal augmentation maneuvers in the common femoral, femoral and popliteal veins. COMPARISON:  No recent. FINDINGS: RIGHT LOWER EXTREMITY Common Femoral Vein: No evidence of thrombus. Normal compressibility, respiratory phasicity and response to augmentation. Saphenofemoral Junction: No evidence of thrombus. Normal compressibility and flow on color Doppler imaging. Profunda Femoral Vein: No evidence of thrombus. Normal compressibility and flow on color Doppler imaging. Femoral Vein: No evidence of thrombus. Normal compressibility, respiratory phasicity and response to augmentation. Popliteal Vein: No evidence of thrombus. Normal compressibility, respiratory phasicity and response to augmentation. Calf Veins: No evidence of thrombus. Normal compressibility and flow on color Doppler imaging. Superficial Great Saphenous Vein: No evidence of thrombus. Normal compressibility and flow on color Doppler imaging. Other Findings:  None. LEFT LOWER EXTREMITY Common Femoral Vein: No evidence of thrombus. Normal compressibility, respiratory phasicity and response to augmentation. Saphenofemoral Junction: No evidence of thrombus. Normal compressibility and flow on color Doppler imaging. Profunda Femoral Vein: No evidence of thrombus. Normal compressibility and flow on color Doppler imaging. Femoral Vein: No evidence of  thrombus. Normal compressibility, respiratory phasicity and response to augmentation. Popliteal Vein: No evidence of thrombus. Normal compressibility, respiratory phasicity and response to augmentation. Calf Veins: No evidence of thrombus. Normal compressibility and flow on color Doppler imaging. Superficial Great Saphenous Vein: No evidence of thrombus. Normal compressibility and flow on color Doppler imaging. Other Findings:  None. IMPRESSION: No evidence of deep venous thrombosis. Electronically Signed   By: Marcello Moores  Register   On: 02/22/2016 11:42    Scheduled Meds: . budesonide (PULMICORT) nebulizer solution  0.25 mg Nebulization BID  . cefTRIAXone  1 g Intravenous Q24H  . feeding supplement (ENSURE ENLIVE)  237 mL Oral BID BM  . ipratropium-albuterol  3 mL Nebulization Q6H  . metoprolol tartrate  25 mg Oral BID  . pantoprazole (PROTONIX) IV  40 mg Intravenous Q12H  . sodium chloride flush  3 mL Intravenous Q12H  . sodium chloride flush  3 mL Intravenous Q12H    Assessment/Plan:  1. Acute respiratory failure with hypoxia. This has resolved and now is on room  air. 2. Acute bronchitis with leukocytosis. Continue Rocephin. CT scan negative for pneumonia. Continue budesonide , DuoNeb nebulizers 3. Bilateral hydronephrosis and bladder scan showing 999 mm in the bladder. S/p Foley catheter placement. Urology consultation appreciated, I recommended to continue Foley catheter until patient is ambulatory, follow-up in the 1-2 weeks after discharge for evaluation of voiding trial. Also nonobstructing stone, follow-up with urology. 4.   Acute anemia. GI will proceed to EGD tomorrow morning since patient's oxygenation has improved. Check another hemoglobin tomorrow. Patient received 1 unit of packed red blood cells. Hemoglobin 8.2 today, essentially stable since yesterday. Continue Protonix. 5. Essential hypertension on metoprolol 6. Acute kidney injury secondary to dehydration. Improved with blood  transfusion. 7. Weakness. Physical therapy evaluation appreciated. Skilled nursing facility is recommended, possibly discharge after EGD 8. Ultrasound of the lower extremities negative for DVT   Code Status:     Code Status Orders        Start     Ordered   02/21/16 2239  Full code  Continuous     02/21/16 2238    Code Status History    Date Active Date Inactive Code Status Order ID Comments User Context   02/07/2016  2:41 PM 02/07/2016  9:54 PM Full Code [id number]  Thornton Park, MD Inpatient   02/07/2016  5:42 AM 02/07/2016  2:41 PM Full Code [id number]  Saundra Shelling, MD Inpatient     Family Communication: Son at the bedside Disposition Plan: Back to facility soon.  Consultants:  Gastroenterology  Urology  Antibiotics:  Rocephin  Time spent: 35 minutes  Discussed with Dr. Wilson Singer  [facility]

## 2016-02-23 NOTE — Progress Notes (Signed)
Jonathon Bellows MD [address], [place], [place] [address] Phone: [phone] Fax : [phone]  Melissa Watkins is being followed for melena  Day 2 of follow up   Subjective: No complaints   Objective: Vital signs in last 24 hours: Vitals:   02/22/16 2130 02/23/16 0148 02/23/16 0411 02/23/16 0752  BP:   (!) 128/49 (!) 163/65  Pulse:   75 79  Resp:   20   Temp:   98.4 F (36.9 C) 97.5 F (36.4 C)  TempSrc:    Oral  SpO2: 98% 99% 98% 99%  Weight:      Height:       Weight change:   Intake/Output Summary (Last 24 hours) at 02/23/16 0958 Last data filed at 02/23/16 0400  Gross per 24 hour  Intake              290 ml  Output             3751 ml  Net            -3461 ml     Exam: Heart:: Regular rate and rhythm, S1S2 present or without murmur or extra heart sounds Lungs: normal, clear to auscultation and clear to auscultation and percussion Abdomen: soft, nontender, normal bowel sounds   Lab Results: CBC Latest Ref Rng & Units 02/23/2016 02/22/2016 02/21/2016  WBC 3.6 - 11.0 K/uL 12.1(H) 17.8(H) 26.7(H)  Hemoglobin 12.0 - 16.0 g/dL 8.2(L) 8.3(L) 7.7(L)  Hematocrit 35.0 - 47.0 % 24.4(L) 24.6(L) 23.8(L)  Platelets 150 - 440 K/uL 383 426 579(H)    Micro Results: Recent Results (from the past 240 hour(s))  Blood Culture (routine x 2)     Status: None (Preliminary result)   Collection Time: 02/21/16  1:41 PM  Result Value Ref Range Status   Specimen Description BLOOD LEFT FA  Final   Special Requests   Final    BOTTLES DRAWN AEROBIC AND ANAEROBIC AER 10ML ANA 8ML   Culture NO GROWTH 2 DAYS  Final   Report Status PENDING  Incomplete  Blood Culture (routine x 2)     Status: None (Preliminary result)   Collection Time: 02/21/16  1:41 PM  Result Value Ref Range Status   Specimen Description BLOOD RIGHT FA  Final   Special Requests   Final    BOTTLES DRAWN AEROBIC AND ANAEROBIC AER 9ML ANA 8ML   Culture NO GROWTH 2 DAYS  Final   Report Status PENDING   Incomplete  Urine culture     Status: None   Collection Time: 02/21/16  2:49 PM  Result Value Ref Range Status   Specimen Description URINE, RANDOM  Final   Special Requests NONE  Final   Culture   Final    NO GROWTH Performed at [facility], [place] [address] [place], [place] [address]    Report Status 02/22/2016 FINAL  Final  MRSA PCR Screening     Status: None   Collection Time: 02/21/16  7:45 PM  Result Value Ref Range Status   MRSA by PCR NEGATIVE NEGATIVE Final    Comment:        The GeneXpert MRSA Assay (FDA approved for NASAL specimens only), is one component of a comprehensive MRSA colonization surveillance program. It is not intended to diagnose MRSA infection nor to guide or monitor treatment for MRSA infections.    Studies/Results: Dg Chest 2 View  Result Date: 02/21/2016 CLINICAL DATA:  Leukocytosis. Currently being treated for pneumonia. Ex-smoker. EXAM:  CHEST  2 VIEW COMPARISON:  02/07/2016 and thoracic spine dated 02/09/2016. FINDINGS: The cardiac silhouette remains borderline enlarged. The aorta remains tortuous and mildly calcified. Clear lungs with mildly prominent interstitial markings. Diffuse osteopenia. Multiple thoracic and thoracolumbar vertebral compression deformities without significant change since 02/09/2016. Moderate-sized hiatal hernia. IMPRESSION: 1. No acute abnormality. 2. Stable borderline cardiomegaly, aortic atherosclerosis, mild chronic interstitial lung disease and moderate-sized hiatal hernia. 3. Stable multiple thoracic and thoracolumbar vertebral compression deformities. Electronically Signed   By: Claudie Revering M.D.   On: 02/21/2016 16:27   Ct Chest Wo Contrast  Result Date: 02/21/2016 CLINICAL DATA:  Acute onset of fever, chills, leukocytosis, hypoxia and productive cough. Shortness of breath and generalized chest pain. Initial encounter. EXAM: CT CHEST WITHOUT CONTRAST TECHNIQUE: Multidetector CT imaging of the chest was performed  following the standard protocol without IV contrast. COMPARISON:  Chest radiograph performed earlier today at 4:01 p.m. FINDINGS: Cardiovascular: Scattered coronary artery calcifications are seen. The heart remains normal in size. Scattered calcification is noted along the aortic arch. The great vessels are grossly unremarkable in appearance. Mediastinum/Nodes: No mediastinal lymphadenopathy is seen. No pericardial effusion is identified. A moderate hiatal hernia is noted. The thyroid gland is grossly unremarkable. No axillary lymphadenopathy is seen. Lungs/Pleura: Trace bilateral pleural fluid is noted, with associated atelectasis. Bilateral emphysema is noted, more prominent at the upper lung lobes. No pneumothorax is seen. No masses are identified. Upper Abdomen: The visualized portions of the liver and spleen are grossly unremarkable. The patient is status post cholecystectomy, with clips noted at the gallbladder fossa. The visualized portions of the adrenal glands are within normal limits. Mild left-sided hydronephrosis is noted. Scattered hypodense and hyperdense bilateral renal cysts are partially imaged. A nonobstructing 7 mm stone is noted at the interpole region of the left kidney. Musculoskeletal: No acute osseous abnormalities are identified. Chronic compression deformities are noted at T5, T7, T8, T10, T11 and L1. There is chronic marked deformity of the body of the sternum. The visualized musculature is unremarkable in appearance. IMPRESSION: 1. Trace bilateral pleural fluid, with associated atelectasis. 2. Bilateral emphysema, more prominent at the upper lung lobes. 3. Scattered coronary artery calcification. 4. Moderate hiatal hernia noted. 5. Mild left-sided hydronephrosis noted. Would correlate with the patient's symptoms. 6. Scattered bilateral renal cysts are partially imaged. Nonobstructing 7 mm at the interpole region of the left kidney. 7. Multiple chronic compression deformities along the  thoracic and lower lumbar spine. Chronic marked deformity of the body of the sternum. Electronically Signed   By: Garald Balding M.D.   On: 02/21/2016 20:02   US Renal  Result Date: 02/22/2016 CLINICAL DATA:  Hydronephrosis EXAM: RENAL / URINARY TRACT ULTRASOUND COMPLETE COMPARISON:  Chest CT 02/21/2016. FINDINGS: Right Kidney: Length: 12.2 cm. Multiple cysts within the right kidney, the largest in the upper pole measures 2.6 cm. Moderate right hydronephrosis. Left Kidney: Length: 9.8 cm. Multiple cysts, the largest off the lower pole measuring 6.0 cm. 7 mm nonobstructing stone in the midpole of the left kidney. There is mild to moderate left hydronephrosis. Bladder: Distended.  No bladder wall abnormality. IMPRESSION: Bilateral hydronephrosis, right greater than left. Distended urinary bladder. Electronically Signed   By: Rolm Baptise M.D.   On: 02/22/2016 11:30   US Venous Img Lower Bilateral  Result Date: 02/22/2016 CLINICAL DATA:  Lower extremity swelling. EXAM: BILATERAL LOWER EXTREMITY VENOUS DOPPLER ULTRASOUND TECHNIQUE: Gray-scale sonography with graded compression, as well as color Doppler and duplex ultrasound were performed to evaluate the  lower extremity deep venous systems from the level of the common femoral vein and including the common femoral, femoral, profunda femoral, popliteal and calf veins including the posterior tibial, peroneal and gastrocnemius veins when visible. The superficial great saphenous vein was also interrogated. Spectral Doppler was utilized to evaluate flow at rest and with distal augmentation maneuvers in the common femoral, femoral and popliteal veins. COMPARISON:  No recent. FINDINGS: RIGHT LOWER EXTREMITY Common Femoral Vein: No evidence of thrombus. Normal compressibility, respiratory phasicity and response to augmentation. Saphenofemoral Junction: No evidence of thrombus. Normal compressibility and flow on color Doppler imaging. Profunda Femoral Vein: No evidence of  thrombus. Normal compressibility and flow on color Doppler imaging. Femoral Vein: No evidence of thrombus. Normal compressibility, respiratory phasicity and response to augmentation. Popliteal Vein: No evidence of thrombus. Normal compressibility, respiratory phasicity and response to augmentation. Calf Veins: No evidence of thrombus. Normal compressibility and flow on color Doppler imaging. Superficial Great Saphenous Vein: No evidence of thrombus. Normal compressibility and flow on color Doppler imaging. Other Findings:  None. LEFT LOWER EXTREMITY Common Femoral Vein: No evidence of thrombus. Normal compressibility, respiratory phasicity and response to augmentation. Saphenofemoral Junction: No evidence of thrombus. Normal compressibility and flow on color Doppler imaging. Profunda Femoral Vein: No evidence of thrombus. Normal compressibility and flow on color Doppler imaging. Femoral Vein: No evidence of thrombus. Normal compressibility, respiratory phasicity and response to augmentation. Popliteal Vein: No evidence of thrombus. Normal compressibility, respiratory phasicity and response to augmentation. Calf Veins: No evidence of thrombus. Normal compressibility and flow on color Doppler imaging. Superficial Great Saphenous Vein: No evidence of thrombus. Normal compressibility and flow on color Doppler imaging. Other Findings:  None. IMPRESSION: No evidence of deep venous thrombosis. Electronically Signed   By: Marcello Moores  Register   On: 02/22/2016 11:42   Medications: I have reviewed the patient's current medications. Scheduled Meds: . budesonide (PULMICORT) nebulizer solution  0.25 mg Nebulization BID  . cefTRIAXone  1 g Intravenous Q24H  . feeding supplement (ENSURE ENLIVE)  237 mL Oral BID BM  . ipratropium-albuterol  3 mL Nebulization Q6H  . metoprolol tartrate  25 mg Oral BID  . pantoprazole (PROTONIX) IV  40 mg Intravenous Q12H  . sodium chloride flush  3 mL Intravenous Q12H  . sodium chloride flush   3 mL Intravenous Q12H   Continuous Infusions: PRN Meds:.sodium chloride, acetaminophen, HYDROcodone-acetaminophen, ondansetron **OR** ondansetron (ZOFRAN) IV, sodium chloride flush   Assessment: Active Problems:   Acute respiratory failure with hypoxia (HCC)   Acute on chronic renal insufficiency   Hyponatremia   Acute posthemorrhagic anemia   Leukocytosis   Hypoxia  Melissa Watkins is a 71 y.o. y/o female with recent Rt hip surgery , admitted last night with respiratory failure probably secondary to bronchitis, drop in Hb , melena. I wasconsulted to evaluate for a GI bleed,  History suggestive of an upper GI bleed likelly secondary to NSAID use . (normocytic acute anemia with elevated BUN and melena).  Stable presently    Plan: 1. F/u H pylori stool antigen . No NSAID's 2. EGD tomorrow    LOS: 2 days   Jonathon Bellows 02/23/2016, 9:58 AM

## 2016-02-23 NOTE — Progress Notes (Addendum)
Clinical Education officer, museum (CSW) contacted patient's son [name] and presented bed offers. CSW also explained that patient has used 47 medicare SNF days and has only 9 days left the medicare will pay for SNF at 100%. CSW explained that after the 9 days patient will be responsible for a 20% co-pay. [facility] can offer a semi-private room and [facility] can offer a private room. Per son he will talk with patient about offers and get back to CSW with a choice. CSW will continue to follow and assist as needed.   [name] called CSW back and reported that patient has chosen [facility] with the semi-private room. [facility] admissions coordinator at [facility] is aware of accepted bed offer.   [employer], LCSW [phone]

## 2016-02-24 ENCOUNTER — Encounter: Payer: Self-pay | Admitting: *Deleted

## 2016-02-24 ENCOUNTER — Inpatient Hospital Stay: Payer: Medicare Other | Admitting: Anesthesiology

## 2016-02-24 ENCOUNTER — Encounter
Admission: EM | Disposition: A | Discharge status: Skilled Nursing Facility | Payer: Self-pay | Source: Home / Self Care | Attending: Internal Medicine

## 2016-02-24 DIAGNOSIS — K269 Duodenal ulcer, unspecified as acute or chronic, without hemorrhage or perforation: Secondary | ICD-10-CM

## 2016-02-24 DIAGNOSIS — K921 Melena: Secondary | ICD-10-CM

## 2016-02-24 HISTORY — PX: ESOPHAGOGASTRODUODENOSCOPY (EGD) WITH PROPOFOL: SHX5813

## 2016-02-24 LAB — CBC
HEMATOCRIT: 26 % — AB (ref 35.0–47.0)
HEMOGLOBIN: 8.8 g/dL — AB (ref 12.0–16.0)
MCH: 29.8 pg (ref 26.0–34.0)
MCHC: 33.9 g/dL (ref 32.0–36.0)
MCV: 88 fL (ref 80.0–100.0)
Platelets: 415 10*3/uL (ref 150–440)
RBC: 2.95 MIL/uL — ABNORMAL LOW (ref 3.80–5.20)
RDW: 20.1 % — ABNORMAL HIGH (ref 11.5–14.5)
WBC: 12.1 10*3/uL — ABNORMAL HIGH (ref 3.6–11.0)

## 2016-02-24 LAB — H. PYLORI ANTIGEN, STOOL: H. PYLORI STOOL AG, EIA: NEGATIVE

## 2016-02-24 SURGERY — ESOPHAGOGASTRODUODENOSCOPY (EGD) WITH PROPOFOL
Anesthesia: General

## 2016-02-24 MED ORDER — MIDAZOLAM HCL 2 MG/2ML IJ SOLN
INTRAMUSCULAR | Status: DC | PRN
  Administered 2016-02-24: 1 mg via INTRAVENOUS

## 2016-02-24 MED ORDER — DEXAMETHASONE SODIUM PHOSPHATE 4 MG/ML IJ SOLN
INTRAMUSCULAR | Status: DC | PRN
  Administered 2016-02-24: 5 mg via INTRAVENOUS

## 2016-02-24 MED ORDER — IPRATROPIUM-ALBUTEROL 0.5-2.5 (3) MG/3ML IN SOLN
3.0000 mL | Freq: Once | RESPIRATORY_TRACT | Status: AC
  Administered 2016-02-24: 3 mL via RESPIRATORY_TRACT

## 2016-02-24 MED ORDER — SODIUM CHLORIDE 0.9 % IV SOLN
INTRAVENOUS | Status: DC
  Administered 2016-02-24: 11:00:00 via INTRAVENOUS

## 2016-02-24 MED ORDER — PROPOFOL 500 MG/50ML IV EMUL
INTRAVENOUS | Status: AC
Start: 2016-02-24 — End: 2016-02-24
  Filled 2016-02-24: qty 50

## 2016-02-24 MED ORDER — MIDAZOLAM HCL 2 MG/2ML IJ SOLN
INTRAMUSCULAR | Status: AC
  Filled 2016-02-24: qty 2

## 2016-02-24 MED ORDER — DEXAMETHASONE SODIUM PHOSPHATE 10 MG/ML IJ SOLN
INTRAMUSCULAR | Status: AC
  Filled 2016-02-24: qty 1

## 2016-02-24 MED ORDER — FENTANYL CITRATE (PF) 100 MCG/2ML IJ SOLN
INTRAMUSCULAR | Status: DC | PRN
  Administered 2016-02-24: 50 ug via INTRAVENOUS

## 2016-02-24 MED ORDER — PROPOFOL 500 MG/50ML IV EMUL
INTRAVENOUS | Status: DC | PRN
  Administered 2016-02-24: 100 ug/kg/min via INTRAVENOUS

## 2016-02-24 MED ORDER — ONDANSETRON HCL 4 MG/2ML IJ SOLN
INTRAMUSCULAR | Status: AC
Start: 2016-02-24 — End: 2016-02-24
  Filled 2016-02-24: qty 2

## 2016-02-24 MED ORDER — FENTANYL CITRATE (PF) 100 MCG/2ML IJ SOLN
INTRAMUSCULAR | Status: AC
  Filled 2016-02-24: qty 2

## 2016-02-24 MED ORDER — IPRATROPIUM-ALBUTEROL 0.5-2.5 (3) MG/3ML IN SOLN
RESPIRATORY_TRACT | Status: AC
  Administered 2016-02-24: 3 mL
  Filled 2016-02-24: qty 3

## 2016-02-24 MED ORDER — SODIUM CHLORIDE 0.9 % IV SOLN: INTRAVENOUS | Status: DC

## 2016-02-24 NOTE — Anesthesia Post-op Follow-up Note (Cosign Needed)
Anesthesia QCDR form completed.        

## 2016-02-24 NOTE — Progress Notes (Signed)
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  Pt admitted with respiratory failure likely secondary to bronchitis, drop in Hgb, and melena. Pt's PMH includes chronic back pain.    PT Comments    Ms. Buczkowski agreeable to therapy but anxious about mobility and continues to repeat, "I can't do it".  Pt encouraged to attempt ambulation with assist from therapist and education provided on potential consequences of delaying mobility.  She requires min assist for supine>sit to scoot to EOB.  Pt stood with mod assist x2 and cues for technique; however pt becomes anxious once standing saying, "I can't do it" and sits both times on side of bed without informing PT first.  Pt refuses to attempt pivot or ambulation.  SNF remains appropriate d/c plan.  Pt will benefit from continued skilled PT services to increase functional independence and safety.   Follow Up Recommendations  SNF     Equipment Recommendations  None recommended by PT    Recommendations for Other Services OT consult     Precautions / Restrictions Precautions Precautions: Fall;Posterior Hip Precaution Comments: Posterior Hip precautions.  Pt unable to recall any hip preautions, these were reveiewed with the pt at the start of the session. Restrictions Weight Bearing Restrictions: Yes RLE Weight Bearing: Weight bearing as tolerated    Mobility  Bed Mobility Overal bed mobility: Needs Assistance Bed Mobility: Supine to Sit;Sit to Supine     Supine to sit: HOB elevated;Min assist Sit to supine: Mod assist   General bed mobility comments: Cues for tecnique and pt uses bed rail to pull up to sitting.  Requires  assist using bed pad to scoot to EOB.  To return to supine pt needs assist managing BLEs.  Transfers Overall transfer level: Needs assistance Equipment used: Rolling walker (2 wheeled) Transfers: Sit to/from Stand Sit to Stand: Mod assist         General transfer comment: Cues for hand placement and technique.  Sit<>stand x2 from bed.  On each attempt pt requires max encouragement and max cues to stand upright.  She repeats "I can't do this" and sits down on bed without warning this PT.  Pt refuses to attempt pivot to chair.  Ambulation/Gait             General Gait Details: Pt refuses to attempt ambulation   Stairs            Wheelchair Mobility    Modified Rankin (Stroke Patients Only)       Balance Overall balance assessment: Needs assistance;History of Falls Sitting-balance support: Feet supported;Single extremity supported Sitting balance-Leahy Scale: Poor Sitting balance - Comments: Pt relies on at least 1UE support while seated EOB Postural control: Posterior lean Standing balance support: Bilateral upper extremity supported;During functional activity Standing balance-Leahy Scale: Poor Standing balance comment: Pt relies on RW and outside physical assist to steady                    Cognition Arousal/Alertness: Awake/alert Behavior During Therapy: Anxious Overall Cognitive Status: Impaired/Different from baseline Area of Impairment: Safety/judgement;Problem solving;Attention   Current Attention Level: Selective     Safety/Judgement: Decreased awareness of safety;Decreased awareness of deficits   Problem  Solving: Slow processing;Difficulty sequencing;Requires verbal cues;Requires tactile cues;Decreased initiation      Exercises General Exercises - Lower Extremity Ankle Circles/Pumps: AROM;Both;10 reps;Supine Quad Sets: Strengthening;Both;10 reps;Supine Gluteal Sets: Strengthening;Both;10 reps;Supine Hip ABduction/ADduction:  Strengthening;AAROM;Right;10 reps;Supine    General Comments        Pertinent Vitals/Pain Pain Assessment: Faces Faces Pain Scale: Hurts whole lot Pain Location: Right Hip Pain Descriptors / Indicators: Guarding;Moaning;Grimacing Pain Intervention(s): Limited activity within patient's tolerance;Monitored during session    Home Living                      Prior Function            PT Goals (current goals can now be found in the care plan section) Acute Rehab PT Goals Patient Stated Goal: none stated PT Goal Formulation: With patient Time For Goal Achievement: 03/07/16 Potential to Achieve Goals: Fair Progress towards PT goals: Progressing toward goals    Frequency    7X/week      PT Plan Current plan remains appropriate    Co-evaluation             End of Session Equipment Utilized During Treatment: Gait belt Activity Tolerance: Patient limited by fatigue;Patient limited by pain Patient left: with call bell/phone within reach;in bed;with bed alarm set;with SCD's reapplied     Time: 1350-1415 PT Time Calculation (min) (ACUTE ONLY): 25 min  Charges:  $Therapeutic Exercise: 8-22 mins $Therapeutic Activity: 8-22 mins                    G Codes:       Collie Siad PT, DPT 02/24/2016, 3:45 PM

## 2016-02-24 NOTE — Progress Notes (Signed)
Patient ID: Melissa Watkins, female   DOB: [birth year], 71 y.o.   MRN: [id number]  [facility] PROGRESS NOTE  Melissa Watkins GUR:[id number] DOB: 28-Aug-1945 DOA: 02/21/2016 PCP: No PCP Per Patient  HPI/Subjective: Patient feels good today, denies any shortness of breath, no bleeding. The hemoglobin level remains stable.  the patient is on room air now, oxygen saturations are 100%. A shin underwent EGD today by Dr. Vicente Males, revealing large duodenal ulcer, nonbleeding, severe as of arthritis, biopsies are taken, repeated EGD is recommended as well as GI follow-up as outpatient. Patient feels good today. Denies any pain or shortness of breath    Objective: Vitals:   02/24/16 1224 02/24/16 1234  BP: (!) 109/51 128/62  Pulse: 72 77  Resp: 14 15  Temp:      Filed Weights   02/21/16 1337 02/21/16 2238 02/24/16 1023  Weight: 50.3 kg (111 lb) 54.5 kg (120 lb 3.2 oz) 54.4 kg (120 lb)    ROS: Review of Systems  Constitutional: Negative for chills and fever.  Eyes: Negative for blurred vision.  Respiratory: Negative for cough and shortness of breath.   Cardiovascular: Negative for chest pain.  Gastrointestinal: Negative for abdominal pain, constipation, diarrhea, nausea and vomiting.  Genitourinary: Negative for dysuria.  Musculoskeletal: Negative for joint pain.  Neurological: Negative for dizziness and headaches.   Exam: Physical Exam  Constitutional: She is oriented to person, place, and time.  HENT:  Nose: No mucosal edema.  Mouth/Throat: No oropharyngeal exudate or posterior oropharyngeal edema.  Eyes: Conjunctivae, EOM and lids are normal. Pupils are equal, round, and reactive to light.  Neck: No JVD present. Carotid bruit is not present. No edema present. No thyroid mass and no thyromegaly present.  Cardiovascular: S1 normal and S2 normal.  Exam reveals no gallop.   No murmur heard. Pulses:      Dorsalis pedis pulses are 2+ on the right side, and 2+ on the left side.   Respiratory: No respiratory distress. She has decreased breath sounds in the right lower field and the left lower field. She has no wheezes. She has no rhonchi. She has no rales.  GI: Soft. Bowel sounds are normal. There is no tenderness.  Musculoskeletal:       Right ankle: She exhibits swelling.       Left ankle: She exhibits swelling.  Lymphadenopathy:    She has no cervical adenopathy.  Neurological: She is alert and oriented to person, place, and time. No cranial nerve deficit.  Skin: Skin is warm. No rash noted. Nails show no clubbing.  Psychiatric: She has a normal mood and affect.      Data Reviewed: Basic Metabolic Panel:  Recent Labs Lab 02/21/16 1341 02/22/16 0424 02/23/16 0352  NA 129* 135 136  K 4.8 4.1 4.2  CL 98* 103 105  CO2 19* 22 25  GLUCOSE 118* 83 78  BUN 86* 71* 52*  CREATININE 1.83* 1.26* 0.98  CALCIUM 8.0* 7.7* 7.7*   Liver Function Tests:  Recent Labs Lab 02/21/16 1341  AST 28  ALT 11*  ALKPHOS 92  BILITOT 0.3  PROT 6.6  ALBUMIN 2.6*   CBC:  Recent Labs Lab 02/21/16 1341 02/22/16 0424 02/23/16 0352 02/24/16 0339  WBC 26.7* 17.8* 12.1* 12.1*  NEUTROABS 24.1*  --   --   --   HGB 7.7* 8.3* 8.2* 8.8*  HCT 23.8* 24.6* 24.4* 26.0*  MCV 92.4 87.2 87.0 88.0  PLT 579* 426 383 415   Cardiac Enzymes:  Recent Labs  Lab 02/21/16 2253 02/22/16 0424 02/22/16 1157  TROPONINI 0.04* <0.03 0.04*     Recent Results (from the past 240 hour(s))  Blood Culture (routine x 2)     Status: None (Preliminary result)   Collection Time: 02/21/16  1:41 PM  Result Value Ref Range Status   Specimen Description BLOOD LEFT FA  Final   Special Requests   Final    BOTTLES DRAWN AEROBIC AND ANAEROBIC AER 10ML ANA 8ML   Culture NO GROWTH 3 DAYS  Final   Report Status PENDING  Incomplete  Blood Culture (routine x 2)     Status: None (Preliminary result)   Collection Time: 02/21/16  1:41 PM  Result Value Ref Range Status   Specimen Description BLOOD  RIGHT FA  Final   Special Requests   Final    BOTTLES DRAWN AEROBIC AND ANAEROBIC AER 9ML ANA 8ML   Culture NO GROWTH 3 DAYS  Final   Report Status PENDING  Incomplete  Urine culture     Status: None   Collection Time: 02/21/16  2:49 PM  Result Value Ref Range Status   Specimen Description URINE, RANDOM  Final   Special Requests NONE  Final   Culture   Final    NO GROWTH Performed at [facility], [place] [address], [place], [place] [address]    Report Status 02/22/2016 FINAL  Final  MRSA PCR Screening     Status: None   Collection Time: 02/21/16  7:45 PM  Result Value Ref Range Status   MRSA by PCR NEGATIVE NEGATIVE Final    Comment:        The GeneXpert MRSA Assay (FDA approved for NASAL specimens only), is one component of a comprehensive MRSA colonization surveillance program. It is not intended to diagnose MRSA infection nor to guide or monitor treatment for MRSA infections.      Studies: No results found.  Scheduled Meds: . budesonide (PULMICORT) nebulizer solution  0.25 mg Nebulization BID  . cefTRIAXone  1 g Intravenous Q24H  . feeding supplement (ENSURE ENLIVE)  237 mL Oral BID BM  . ipratropium-albuterol  3 mL Nebulization TID  . metoprolol tartrate  25 mg Oral BID  . pantoprazole (PROTONIX) IV  40 mg Intravenous Q12H  . sodium chloride flush  3 mL Intravenous Q12H  . sodium chloride flush  3 mL Intravenous Q12H    Assessment/Plan:  1. Acute respiratory failure with hypoxia. This has resolved and now is on room air. 2. Acute bronchitis with leukocytosis. Continue Rocephin. CT scan negative for pneumonia. Continue budesonide , DuoNeb nebulizers 3. Bilateral hydronephrosis and bladder scan showing 999 mm in the bladder. S/p Foley catheter placement. Urology consultation appreciated,  recommended to continue Foley catheter until patient is ambulatory, follow-up in the 1-2 weeks after discharge for evaluation of voiding trial. Also nonobstructing stone,  follow-up with urology. 4.   Acute anemia. Status post EGD 02/24/2016, large duodenal ulcer, esophagitis was noted, biopsies were taken, recommended to repeat EGD as outpatient. H. pylori testing is ordered  Patient received 1 unit of packed red blood cells. Hemoglobin 8.8 today, stable since transfusion. Continue Protonix. 5. Essential hypertension on metoprolol 6. Acute kidney injury secondary to dehydration. Improved with blood transfusion. 7. Weakness. Physical therapy evaluation appreciated. Skilled nursing facility is recommended, discharge tomorrow after patient is weaned off oxygen  8. Ultrasound of the lower extremities negative for DVT   Code Status:     Code Status Orders  Start     Ordered   02/21/16 2239  Full code  Continuous     02/21/16 2238    Code Status History    Date Active Date Inactive Code Status Order ID Comments User Context   02/07/2016  2:41 PM 02/07/2016  9:54 PM Full Code [id number]  Thornton Park, MD Inpatient   02/07/2016  5:42 AM 02/07/2016  2:41 PM Full Code [id number]  Saundra Shelling, MD Inpatient     Family Communication: Son at the bedside Disposition Plan: Back to facility soon.  Consultants:  Gastroenterology  Urology  Antibiotics:  Rocephin  Time spent: 35 minutes    [place]

## 2016-02-24 NOTE — Op Note (Signed)
[facility] Gastroenterology Patient Name: Melissa Watkins Procedure Date: 02/24/2016 11:35 AM MRN: [id number] Account #: [id number] Date of Birth: Jan 20, 1946 Admit Type: Inpatient Age: 71 Room: [facility] ENDO ROOM 4 Gender: Female Note Status: Finalized Procedure:            Upper GI endoscopy Indications:          Melena Providers:            Jonathon Bellows MD, MD Medicines:            Monitored Anesthesia Care Complications:        No immediate complications. Procedure:            Pre-Anesthesia Assessment:                       - Prior to the procedure, a History and Physical was                        performed, and patient medications, allergies and                        sensitivities were reviewed. The patient's tolerance of                        previous anesthesia was reviewed.                       - The risks and benefits of the procedure and the                        sedation options and risks were discussed with the                        patient. All questions were answered and informed                        consent was obtained.                       - The risks and benefits of the procedure and the                        sedation options and risks were discussed with the                        patient. All questions were answered and informed                        consent was obtained.                       - ASA Grade Assessment: III - A patient with severe                        systemic disease.                       After obtaining informed consent, the endoscope was                        passed under direct vision. Throughout the procedure,  the patient's blood pressure, pulse, and oxygen                        saturations were monitored continuously. The                        Colonoscope was introduced through the mouth, and                        advanced to the third part of duodenum. The upper GI    endoscopy was accomplished with ease. The patient                        tolerated the procedure well. Findings:      One non-bleeding superficial duodenal ulcer with a clean ulcer base       (Forrest Class III) was found in the first portion of the duodenum. The       lesion was 12 mm in largest dimension.      A 6 cm hiatal hernia was present.      Localized moderate mucosal changes characterized by congestion,       granularity, nodularity and an increased vascular pattern were found at       the gastroesophageal junction and in the cardia. Biopsies were taken       with a cold forceps for histology.      LA Grade D (one or more mucosal breaks involving at least 75% of       esophageal circumference) esophagitis with no bleeding was found 30 cm       from the incisors. Impression:           - One non-bleeding duodenal ulcer with a clean ulcer                        base (Forrest Class III).                       - No specimens collected. Recommendation:       - Return patient to hospital ward for ongoing care.                       - Resume regular diet today.                       - Use Protonix (pantoprazole) 40 mg PO daily for 6                        weeks.                       - Check H pylori stool antigen                       F/u biopsy results                       Repeat EGD in 8 weeks time to check resolution of                        severe esophagitis                       Keep head  end of the bed elevated at 45 degrees for                        reflux                       Outpatient GI clinic follow up                       Monitor CBC Procedure Code(s):    --- Professional ---                       [phone], Esophagogastroduodenoscopy, flexible, transoral;                        with biopsy, single or multiple Diagnosis Code(s):    --- Professional ---                       K26.9, Duodenal ulcer, unspecified as acute or chronic,                        without  hemorrhage or perforation                       K92.1, Melena (includes Hematochezia) CPT copyright 2016 American Medical Association. All rights reserved. The codes documented in this report are preliminary and upon coder review may  be revised to meet current compliance requirements. Jonathon Bellows, MD Jonathon Bellows MD, MD 02/24/2016 12:09:50 PM This report has been signed electronically. Number of Addenda: 0 Note Initiated On: 02/24/2016 11:35 AM      [facility]

## 2016-02-24 NOTE — Anesthesia Preprocedure Evaluation (Signed)
Anesthesia Evaluation  Patient identified by MRN, date of birth, ID band Patient confused    Reviewed: Allergy & Precautions, H&P , NPO status , Patient's Chart, lab work & pertinent test results  History of Anesthesia Complications Negative for: history of anesthetic complications  Airway Mallampati: III  TM Distance: <3 FB Neck ROM: limited    Dental  (+) Poor Dentition, Chipped, Missing   Pulmonary neg shortness of breath, pneumonia, COPD, Current Smoker,    Pulmonary exam normal breath sounds clear to auscultation       Cardiovascular Exercise Tolerance: Good (-) angina(-) Past MI and (-) DOE negative cardio ROS Normal cardiovascular exam Rhythm:regular Rate:Normal     Neuro/Psych CVA negative neurological ROS  negative psych ROS   GI/Hepatic negative GI ROS, Neg liver ROS, neg GERD  ,  Endo/Other  negative endocrine ROS  Renal/GU Renal disease  negative genitourinary   Musculoskeletal   Abdominal   Peds  Hematology negative hematology ROS (+) anemia ,   Anesthesia Other Findings Patient has medical clearance for this procedure.  They feel that her pulmonary process is much resolved, she has been 97-100% on room air.  Patient is NPO appropriate and reports no nausea or vomiting today.   Past Medical History: No date: Back pain, chronic  Past Surgical History: No date: CHOLECYSTECTOMY No date: COLONOSCOPY 02/07/2016: HIP ARTHROPLASTY Right     Comment: Procedure: ARTHROPLASTY BIPOLAR HIP               (HEMIARTHROPLASTY);  Surgeon: Thornton Park,               MD;  Location: [facility] ORS;  Service: Orthopedics;              Laterality: Right; No date: OOPHORECTOMY  BMI    Body Mass Index:  22.67 kg/m      Reproductive/Obstetrics negative OB ROS                             Anesthesia Physical Anesthesia Plan  ASA: III  Anesthesia Plan: General   Post-op Pain Management:     Induction:   Airway Management Planned:   Additional Equipment:   Intra-op Plan:   Post-operative Plan:   Informed Consent: I have reviewed the patients History and Physical, chart, labs and discussed the procedure including the risks, benefits and alternatives for the proposed anesthesia with the patient or authorized representative who has indicated his/her understanding and acceptance.   Dental Advisory Given  Plan Discussed with: Anesthesiologist, CRNA and Surgeon  Anesthesia Plan Comments: (Consent via son [name] at [phone])        Anesthesia Quick Evaluation

## 2016-02-24 NOTE — Anesthesia Postprocedure Evaluation (Signed)
Anesthesia Post Note  Patient: Melissa Watkins  Procedure(s) Performed: Procedure(s) (LRB): ESOPHAGOGASTRODUODENOSCOPY (EGD) WITH PROPOFOL (N/A)  Patient location during evaluation: Endoscopy Anesthesia Type: General Level of consciousness: awake and alert Pain management: pain level controlled Vital Signs Assessment: post-procedure vital signs reviewed and stable Respiratory status: spontaneous breathing, nonlabored ventilation, respiratory function stable and patient connected to nasal cannula oxygen Cardiovascular status: blood pressure returned to baseline and stable Postop Assessment: no signs of nausea or vomiting Anesthetic complications: no     Last Vitals:  Vitals:   02/24/16 1224 02/24/16 1234  BP: (!) 109/51 128/62  Pulse: 72 77  Resp: 14 15  Temp:      Last Pain:  Vitals:   02/24/16 1214  TempSrc:   PainSc: Asleep                 Precious Haws Shaquna Geigle

## 2016-02-24 NOTE — Progress Notes (Signed)
EGD:  1. Non bleeding large duodenal ulcer 2. Severe esophagitis 3. Abnormal mucosa at the GE junction and cardia that was biopsied  Plan  1. PPI 2. Keep head end of bed at 45 degrees to prevent excess reflux 3. H pylori stool antigen  4. F/u Bx 5. No NSAID's 6. Repeat EGD 6-8 weeks to check for healing of esophagitis and revaluate the abnormal appearing mucosa at GE junction 7. OP GI follow up   I will sign off.  Please call me if any further GI concerns or questions.  We would like to thank you for the opportunity to participate in the care of [facility].   Dr Jonathon Bellows  Gastroenterology/Hepatology Pager: [phone]

## 2016-02-24 NOTE — Transfer of Care (Signed)
Immediate Anesthesia Transfer of Care Note  Patient: Melissa Watkins  Procedure(s) Performed: Procedure(s): ESOPHAGOGASTRODUODENOSCOPY (EGD) WITH PROPOFOL (N/A)  Patient Location: PACU  Anesthesia Type:General  Level of Consciousness: awake and sedated  Airway & Oxygen Therapy: Patient Spontanous Breathing and Patient connected to nasal cannula oxygen  Post-op Assessment: Report given to RN and Post -op Vital signs reviewed and stable  Post vital signs: Reviewed and stable  Last Vitals:  Vitals:   02/24/16 0348 02/24/16 1023  BP: (!) 162/73 (!) 145/58  Pulse: 96 80  Resp: 20 (!) 22  Temp: 36.6 C (!) 36 C    Last Pain:  Vitals:   02/24/16 1023  TempSrc: Tympanic  PainSc: 0-No pain         Complications: No apparent anesthesia complications

## 2016-02-24 NOTE — H&P (Signed)
Jonathon Bellows MD [address] [place], [place] [address] Phone: [phone] Fax : [phone]  Primary Care Physician:  No PCP Per Patient Primary Gastroenterologist:  Dr. Jonathon Bellows   Pre-Procedure History & Physical: HPI:  Melissa Watkins is a 71 y.o. female is here for an endoscopy.   Past Medical History:  Diagnosis Date  . Back pain, chronic     Past Surgical History:  Procedure Laterality Date  . CHOLECYSTECTOMY    . COLONOSCOPY    . HIP ARTHROPLASTY Right 02/07/2016   Procedure: ARTHROPLASTY BIPOLAR HIP (HEMIARTHROPLASTY);  Surgeon: Thornton Park, MD;  Location: [facility] ORS;  Service: Orthopedics;  Laterality: Right;  . OOPHORECTOMY      Prior to Admission medications   Medication Sig Start Date End Date Taking? Authorizing Provider  acetaminophen (TYLENOL) 500 MG tablet Take 500 mg by mouth every 12 (twelve) hours as needed.    Yes Historical Provider, MD  Acidophilus Lactobacillus CAPS Take 1 capsule by mouth daily. For 10 days   Yes Historical Provider, MD  aspirin 81 MG tablet Take 81 mg by mouth daily.   Yes Historical Provider, MD  enoxaparin (LOVENOX) 30 MG/0.3ML injection Inject 0.3 mLs (30 mg total) into the skin daily. 02/10/16  Yes Fritzi Mandes, MD  feeding supplement, ENSURE ENLIVE, (ENSURE ENLIVE) LIQD Take 237 mLs by mouth 2 (two) times daily between meals. 02/10/16  Yes Fritzi Mandes, MD  ferrous sulfate 325 (65 FE) MG tablet Take 1 tablet (325 mg total) by mouth 3 (three) times daily after meals. 02/10/16  Yes Fritzi Mandes, MD  HYDROcodone-acetaminophen (NORCO) 7.5-325 MG tablet Take 1-2 tablets by mouth every 4 (four) hours as needed for moderate pain. Patient taking differently: Take 1 tablet by mouth every 4 (four) hours as needed for moderate pain.  02/10/16  Yes Fritzi Mandes, MD  metoprolol tartrate (LOPRESSOR) 25 MG tablet Take 1 tablet (25 mg total) by mouth 2 (two) times daily. 02/10/16  Yes Fritzi Mandes, MD  cephALEXin (KEFLEX) 250 MG capsule Take 1 capsule  (250 mg total) by mouth every 12 (twelve) hours. Patient not taking: Reported on 02/21/2016 02/10/16   Fritzi Mandes, MD    Allergies as of 02/21/2016 - Review Complete 02/21/2016  Allergen Reaction Noted  . Oxycodone-acetaminophen Other (See Comments) 09/06/2013  . Valium [diazepam]  07/15/2013    Family History  Problem Relation Age of Onset  . Asthma Son     Social History   Social History  . Marital status: Widowed    Spouse name: N/A  . Number of children: N/A  . Years of education: N/A   Occupational History  . retired    Social History Main Topics  . Smoking status: Current Every Day Smoker    Packs/day: 1.00    Years: 50.00    Types: Cigarettes  . Smokeless tobacco: Never Used  . Alcohol use No     Comment: occasional  . Drug use: No  . Sexual activity: Not on file   Other Topics Concern  . Not on file   Social History Narrative  . No narrative on file    Review of Systems: See HPI, otherwise negative ROS  Physical Exam: BP (!) 145/58   Pulse 80   Temp (!) 96.8 F (36 C) (Tympanic)   Resp (!) 22   Ht '5\' 1"'$  (1.549 m)   Wt 120 lb (54.4 kg)   SpO2 97%   BMI 22.67 kg/m  General:   Alert,  pleasant and  cooperative in NAD Head:  Normocephalic and atraumatic. Neck:  Supple; no masses or thyromegaly. Lungs:  Clear throughout to auscultation.    Heart:  Regular rate and rhythm. Abdomen:  Soft, nontender and nondistended. Normal bowel sounds, without guarding, and without rebound.   Neurologic:  Alert and  oriented x4;  grossly normal neurologically.  Impression/Plan: Melissa Watkins is here for an endoscopy to be performed for GI bleed  Risks, benefits, limitations, and alternatives regarding  endoscopy have been reviewed with the patient.  Questions have been answered.  All parties agreeable.   Jonathon Bellows, MD  02/24/2016, 11:42 AM

## 2016-02-24 NOTE — Progress Notes (Signed)
PT Cancellation Note  Patient Details Name: Melissa Watkins MRN: [id number] DOB: 1945/03/18   Cancelled Treatment:    Reason Eval/Treat Not Completed: Patient at procedure or test/unavailable (for endoscopy).  Will attempt to see pt again later today, schedule permitting.   Collie Siad PT, DPT 02/24/2016, 11:25 AM

## 2016-02-25 ENCOUNTER — Encounter: Payer: Self-pay | Admitting: Gastroenterology

## 2016-02-25 DIAGNOSIS — J441 Chronic obstructive pulmonary disease with (acute) exacerbation: Secondary | ICD-10-CM

## 2016-02-25 DIAGNOSIS — N1330 Unspecified hydronephrosis: Secondary | ICD-10-CM | POA: Diagnosis not present

## 2016-02-25 DIAGNOSIS — K922 Gastrointestinal hemorrhage, unspecified: Secondary | ICD-10-CM | POA: Diagnosis not present

## 2016-02-25 DIAGNOSIS — R109 Unspecified abdominal pain: Secondary | ICD-10-CM | POA: Diagnosis not present

## 2016-02-25 DIAGNOSIS — J209 Acute bronchitis, unspecified: Secondary | ICD-10-CM | POA: Diagnosis not present

## 2016-02-25 DIAGNOSIS — F10231 Alcohol dependence with withdrawal delirium: Secondary | ICD-10-CM

## 2016-02-25 DIAGNOSIS — J189 Pneumonia, unspecified organism: Secondary | ICD-10-CM | POA: Diagnosis not present

## 2016-02-25 DIAGNOSIS — M84459A Pathological fracture, hip, unspecified, initial encounter for fracture: Secondary | ICD-10-CM | POA: Diagnosis not present

## 2016-02-25 DIAGNOSIS — R2681 Unsteadiness on feet: Secondary | ICD-10-CM | POA: Diagnosis not present

## 2016-02-25 DIAGNOSIS — Z7401 Bed confinement status: Secondary | ICD-10-CM | POA: Diagnosis not present

## 2016-02-25 DIAGNOSIS — N320 Bladder-neck obstruction: Secondary | ICD-10-CM

## 2016-02-25 DIAGNOSIS — Z9889 Other specified postprocedural states: Secondary | ICD-10-CM

## 2016-02-25 DIAGNOSIS — Z466 Encounter for fitting and adjustment of urinary device: Secondary | ICD-10-CM | POA: Diagnosis not present

## 2016-02-25 DIAGNOSIS — M6281 Muscle weakness (generalized): Secondary | ICD-10-CM | POA: Diagnosis not present

## 2016-02-25 DIAGNOSIS — Z9289 Personal history of other medical treatment: Secondary | ICD-10-CM

## 2016-02-25 DIAGNOSIS — K209 Esophagitis, unspecified without bleeding: Secondary | ICD-10-CM

## 2016-02-25 DIAGNOSIS — R339 Retention of urine, unspecified: Secondary | ICD-10-CM | POA: Diagnosis not present

## 2016-02-25 DIAGNOSIS — K269 Duodenal ulcer, unspecified as acute or chronic, without hemorrhage or perforation: Secondary | ICD-10-CM

## 2016-02-25 DIAGNOSIS — N281 Cyst of kidney, acquired: Secondary | ICD-10-CM | POA: Diagnosis not present

## 2016-02-25 DIAGNOSIS — J9601 Acute respiratory failure with hypoxia: Secondary | ICD-10-CM | POA: Diagnosis not present

## 2016-02-25 DIAGNOSIS — N1339 Other hydronephrosis: Secondary | ICD-10-CM | POA: Diagnosis not present

## 2016-02-25 DIAGNOSIS — R4189 Other symptoms and signs involving cognitive functions and awareness: Secondary | ICD-10-CM | POA: Diagnosis not present

## 2016-02-25 DIAGNOSIS — D649 Anemia, unspecified: Secondary | ICD-10-CM | POA: Diagnosis not present

## 2016-02-25 DIAGNOSIS — I10 Essential (primary) hypertension: Secondary | ICD-10-CM | POA: Diagnosis not present

## 2016-02-25 DIAGNOSIS — Z978 Presence of other specified devices: Secondary | ICD-10-CM

## 2016-02-25 DIAGNOSIS — R269 Unspecified abnormalities of gait and mobility: Secondary | ICD-10-CM | POA: Diagnosis not present

## 2016-02-25 DIAGNOSIS — J440 Chronic obstructive pulmonary disease with acute lower respiratory infection: Secondary | ICD-10-CM | POA: Diagnosis not present

## 2016-02-25 DIAGNOSIS — J438 Other emphysema: Secondary | ICD-10-CM | POA: Diagnosis not present

## 2016-02-25 DIAGNOSIS — Z960 Presence of urogenital implants: Secondary | ICD-10-CM

## 2016-02-25 DIAGNOSIS — D62 Acute posthemorrhagic anemia: Secondary | ICD-10-CM | POA: Diagnosis not present

## 2016-02-25 LAB — PROCALCITONIN: Procalcitonin: <0.1 ng/mL

## 2016-02-25 MED ORDER — HYDROCODONE-ACETAMINOPHEN 7.5-325 MG PO TABS: 1.0000 | ORAL_TABLET | ORAL | 0 refills | Status: DC | PRN

## 2016-02-25 MED ORDER — SODIUM CHLORIDE 0.9 % IJ SOLN
INTRAMUSCULAR | Status: AC
  Administered 2016-02-25: 10 mL
  Filled 2016-02-25: qty 10

## 2016-02-25 MED ORDER — BUDESONIDE 0.25 MG/2ML IN SUSP: 0.2500 mg | Freq: Two times a day (BID) | RESPIRATORY_TRACT | 12 refills | Status: DC

## 2016-02-25 MED ORDER — IPRATROPIUM-ALBUTEROL 0.5-2.5 (3) MG/3ML IN SOLN: 3.0000 mL | Freq: Three times a day (TID) | RESPIRATORY_TRACT | 6 refills | Status: DC

## 2016-02-25 MED ORDER — FUROSEMIDE 10 MG/ML IJ SOLN: 20.0000 mg | Freq: Once | INTRAMUSCULAR | Status: DC

## 2016-02-25 MED ORDER — PANTOPRAZOLE SODIUM 40 MG PO TBEC: 40.0000 mg | DELAYED_RELEASE_TABLET | Freq: Every day | ORAL | 3 refills | Status: DC

## 2016-02-25 MED ORDER — LEVOFLOXACIN 500 MG PO TABS: 500.0000 mg | ORAL_TABLET | Freq: Every day | ORAL | 0 refills | Status: AC

## 2016-02-25 NOTE — Progress Notes (Signed)
Patient's son Luberta Grabinski notified of mothers discharge status.   Deri Fuelling, RN

## 2016-02-25 NOTE — Progress Notes (Signed)
Patient being transferred to [facility]. RN gave RN report. All questions answered. Patient alert and oriented. EMS called for transport.  Deri Fuelling, RN

## 2016-02-25 NOTE — Progress Notes (Signed)
Patient is medically stable for D/C to [facility] today. Per Seth Bake admissions coordinator at [facility] patient will go to room 206-B. RN will call report at [phone] and arrange EMS for transport. Clinical Education officer, museum (CSW) sent D/C orders to [facility] via [employer]. Patient is aware of above. CSW contacted patient's son [name] and made him aware of above. Bundle case manager is aware of above. Please reconsult if future social work needs arise. CSW signing off.   [employer], LCSW [phone]

## 2016-02-25 NOTE — Clinical Social Work Placement (Signed)
   CLINICAL SOCIAL WORK PLACEMENT  NOTE  Date:  02/25/2016  Patient Details  Name: Melissa Watkins MRN: [id number] Date of Birth: [birth year]  Clinical Social Work is seeking post-discharge placement for this patient at the [facility] level of care (*CSW will initial, date and re-position this form in  chart as items are completed):  Yes   Patient/family provided with [facility] Work Department's list of facilities offering this level of care within the geographic area requested by the patient (or if unable, by the patient's family).  Yes   Patient/family informed of their freedom to choose among providers that offer the needed level of care, that participate in Medicare, Medicaid or managed care program needed by the patient, have an available bed and are willing to accept the patient.  Yes   Patient/family informed of [facility]'s ownership interest in [facility] and [facility], as well as of the fact that they are under no obligation to receive care at these facilities.  PASRR submitted to EDS on       PASRR number received on       Existing PASRR number confirmed on 02/22/16     FL2 transmitted to all facilities in geographic area requested by pt/family on 02/22/16     FL2 transmitted to all facilities within larger geographic area on 02/22/16     Patient informed that his/her managed care company has contracts with or will negotiate with certain facilities, including the following:        Yes   Patient/family informed of bed offers received.  Patient chooses bed at  [facility] )     Physician recommends and patient chooses bed at      Patient to be transferred to  [facility] ) on 02/25/16.  Patient to be transferred to facility by  [facility] )     Patient family notified on 02/25/16 of transfer.  Name of family member notified:   (Patient's son [name] is aware of D/C today. )     PHYSICIAN       Additional  Comment:    _______________________________________________ Willaim Mode, Veronia Beets, LCSW 02/25/2016, 11:14 AM

## 2016-02-25 NOTE — Discharge Summary (Addendum)
[place] [facility] [place] NAME: Melissa Watkins    MR#:  [id number]  DATE OF BIRTH:  09-Nov-1945  DATE OF ADMISSION:  02/21/2016 ADMITTING PHYSICIAN: Theodoro Grist, MD  DATE OF DISCHARGE: No discharge date for patient encounter.  PRIMARY CARE PHYSICIAN: No PCP Per Patient     ADMISSION DIAGNOSIS:  Hydronephrosis [N13.30] Swelling [R60.9] Acute respiratory failure with hypoxia (HCC) [J96.01] Sepsis, due to unspecified organism ([place]) [A41.9] Anemia, unspecified type [D64.9]  DISCHARGE DIAGNOSIS:  Principal Problem:   Acute respiratory failure with hypoxia (HCC) Active Problems:   Hypoxia   Acute bronchitis   COPD exacerbation (HCC)   Acute on chronic renal insufficiency   Hyponatremia   Acute posthemorrhagic anemia   Leukocytosis   Bilateral hydronephrosis   Bladder outlet obstruction   Foley catheter in place   History of blood transfusion   Duodenal ulcer   History of esophagogastroduodenoscopy (EGD)   Acute esophagitis   SECONDARY DIAGNOSIS:   Past Medical History:  Diagnosis Date  . Back pain, chronic     .pro HOSPITAL COURSE:   The patient is a 71 year old Caucasian female with past medical history significant for history of chronic back pain, tobacco abuse, recent right femoral neck fracture, status post right hip hemiarthroplasty 02/07/2016 by Dr. Mack Guise, who was discharged to skilled nursing facility for rehabilitation January 11, who came back to the hospital with recurrent fevers, chills and elevated white blood cell count, hypoxia, cough, green phlegm production for 4 days. Patient's white blood cell count was found to be high and she was sent to emergency room for evaluation and treatment. Moreover, her hemoglobin level was also found to be low. Patient admitted of having back stool one day prior to admission to emergency room. Hemoccult of stool was positive. In emergency room and patient was admitted.  Initial chest x-ray revealed chronic interstitial lung disease, no pneumonia, hiatal hernia. CT of chest without contrast showed left hydronephrosis, emphysema. Patient underwent ultrasound of kidneys, showing distended urinary bladder, bilateral hydronephrosis, right more than left. Patient was seen by urologist and Foley catheter was placed, yielding more than 1-1/2 L of urine.. Urologist recommended to continue Foley catheter for the next 2 weeks and then follow up with her as outpatient for voiding trial. Patient was treated for COPD exacerbation, acute bronchitis with antibiotic therapy in improved clinically. Patient was also evaluated by gastroenterologist and underwent EGD during this admission, revealing nonbleeding duodenal ulcer, LA grade B as a pharyngitis. Patient was recommended to continue Protonix 40 mg daily for 6 weeks, check H. pylori stool antigen, follow up with gastroenterologist as outpatient. As patient was anemic, she was transfused with one unit of packed red blood cells, hemoglobin level remained stable after transfusion. She was felt to be stable to be discharged back to skilled nursing facility for rehabilitation. Discussion by problem: 1. Acute respiratory failure with hypoxia due to COPD exacerbation, acute bronchitis. This has resolved and now is on room air. 2. COPD exacerbation due to acute bronchitis with leukocytosis. Patient received Rocephin while in the hospital, she is to continue antibiotic therapy in the facility to complete a course. CT scan negative for pneumonia, but revealed atelectasis. Continue budesonide , DuoNeb nebulizers, patient is off oxygen with good oxygen saturations, admits of minimal sputum production 3. Bilateral hydronephrosis due to bladder outlet obstruction, multifactorial, bladder scan showing 999 mm in the bladder. S/p Foley catheter placement. Urology consultation appreciated,  recommended to continue Foley catheter until  patient is ambulatory,  follow-up in the 1-2 weeks after discharge for evaluation of voiding trial. Also left kidney, 7 mm nonobstructing stone, follow-up with urology for further recommendations as outpatient.  4.   Acute anemia. Status post EGD 02/24/2016, large duodenal ulcer, esophagitis was noted, biopsies were taken, recommended to repeat EGD as outpatient. H. pylori testing is ordered, but not received, and is unclear if it was taken. H. pylori testing should be done as outpatient  5. Acute posthemorrhagic anemia, status post  1 unit of packed red blood cell transfusion. Hemoglobin 8.8 today, stable after transfusion. Continue Protonix.  6. Essential hypertension on metoprolol, blood pressure is somewhat higher, medications may need to be advanced as outpatient  7. Acute kidney injury with creatinine of 1.26, likely due to obstruction , resolved after blood transfusion and Foley catheter placement.  8. Weakness. Physical therapy evaluation appreciated. Skilled nursing facility is recommended, discharge today   9. Ultrasound of the lower extremities negative for DVT DISCHARGE CONDITIONS:   Stable  CONSULTS OBTAINED:  Treatment Team:  Festus Aloe, MD  DRUG ALLERGIES:   Allergies  Allergen Reactions  . Oxycodone-Acetaminophen Other (See Comments)    Hallucinations  . Valium [Diazepam]     DISCHARGE MEDICATIONS:   Current Discharge Medication List    START taking these medications   Details  budesonide (PULMICORT) 0.25 MG/2ML nebulizer solution Take 2 mLs (0.25 mg total) by nebulization 2 (two) times daily. Qty: 60 mL, Refills: 12    ipratropium-albuterol (DUONEB) 0.5-2.5 (3) MG/3ML SOLN Take 3 mLs by nebulization 3 (three) times daily. Qty: 360 mL, Refills: 6    pantoprazole (PROTONIX) 40 MG tablet Take 1 tablet (40 mg total) by mouth daily. Switch for any other PPI at similar dose and frequency Qty: 30 tablet, Refills: 3      CONTINUE these medications which have CHANGED   Details   HYDROcodone-acetaminophen (NORCO) 7.5-325 MG tablet Take 1 tablet by mouth every 4 (four) hours as needed for moderate pain. Qty: 15 tablet, Refills: 0    levofloxacin (LEVAQUIN) 500 MG tablet Take 1 tablet (500 mg total) by mouth daily. Qty: 6 tablet, Refills: 0      CONTINUE these medications which have NOT CHANGED   Details  acetaminophen (TYLENOL) 500 MG tablet Take 500 mg by mouth every 12 (twelve) hours as needed.     Acidophilus Lactobacillus CAPS Take 1 capsule by mouth daily. For 10 days    enoxaparin (LOVENOX) 30 MG/0.3ML injection Inject 0.3 mLs (30 mg total) into the skin daily. Qty: 14 Syringe, Refills: 0    feeding supplement, ENSURE ENLIVE, (ENSURE ENLIVE) LIQD Take 237 mLs by mouth 2 (two) times daily between meals. Qty: 237 mL, Refills: 12    ferrous sulfate 325 (65 FE) MG tablet Take 1 tablet (325 mg total) by mouth 3 (three) times daily after meals. Qty: 90 tablet, Refills: 3    metoprolol tartrate (LOPRESSOR) 25 MG tablet Take 1 tablet (25 mg total) by mouth 2 (two) times daily. Qty: 60 tablet, Refills: 2      STOP taking these medications     aspirin 81 MG tablet      cephALEXin (KEFLEX) 250 MG capsule          DISCHARGE INSTRUCTIONS:    The patient is to follow-up with primary care physician, urologist as outpatient, she is to follow-up with orthopedist surgeon as previously scheduled  If you experience worsening of your admission symptoms, develop shortness of breath, life threatening  emergency, suicidal or homicidal thoughts you must seek medical attention immediately by calling 911 or calling your MD immediately  if symptoms less severe.  You Must read complete instructions/literature along with all the possible adverse reactions/side effects for all the Medicines you take and that have been prescribed to you. Take any new Medicines after you have completely understood and accept all the possible adverse reactions/side effects.   Please  note  You were cared for by a hospitalist during your hospital stay. If you have any questions about your discharge medications or the care you received while you were in the hospital after you are discharged, you can call the unit and asked to speak with the hospitalist on call if the hospitalist that took care of you is not available. Once you are discharged, your primary care physician will handle any further medical issues. Please note that NO REFILLS for any discharge medications will be authorized once you are discharged, as it is imperative that you return to your primary care physician (or establish a relationship with a primary care physician if you do not have one) for your aftercare needs so that they can reassess your need for medications and monitor your lab values.    Today   CHIEF COMPLAINT:   Chief Complaint  Patient presents with  . Code Sepsis    HISTORY OF PRESENT ILLNESS:  Melissa Watkins  is a 71 y.o. female with a known history of chronic back pain, tobacco abuse, recent right femoral neck fracture, status post right hip hemiarthroplasty 02/07/2016 by Dr. Mack Guise, who was discharged to skilled nursing facility for rehabilitation January 11, who came back to the hospital with recurrent fevers, chills and elevated white blood cell count, hypoxia, cough, green phlegm production for 4 days. Patient's white blood cell count was found to be high and she was sent to emergency room for evaluation and treatment. Moreover, her hemoglobin level was also found to be low. Patient admitted of having back stool one day prior to admission to emergency room. Hemoccult of stool was positive. In emergency room and patient was admitted. Initial chest x-ray revealed chronic interstitial lung disease, no pneumonia, hiatal hernia. CT of chest without contrast showed left hydronephrosis, emphysema. Patient underwent ultrasound of kidneys, showing distended urinary bladder, bilateral hydronephrosis,  right more than left. Patient was seen by urologist and Foley catheter was placed, yielding more than 1-1/2 L of urine.. Urologist recommended to continue Foley catheter for the next 2 weeks and then follow up with her as outpatient for voiding trial. Patient was treated for COPD exacerbation, acute bronchitis with antibiotic therapy in improved clinically. Patient was also evaluated by gastroenterologist and underwent EGD during this admission, revealing nonbleeding duodenal ulcer, LA grade B as a pharyngitis. Patient was recommended to continue Protonix 40 mg daily for 6 weeks, check H. pylori stool antigen, follow up with gastroenterologist as outpatient. As patient was anemic, she was transfused with one unit of packed red blood cells, hemoglobin level remained stable after transfusion. She was felt to be stable to be discharged back to skilled nursing facility for rehabilitation. Discussion by problem: 4. Acute respiratory failure with hypoxia due to COPD exacerbation, acute bronchitis. This has resolved and now is on room air. 5. COPD exacerbation due to acute bronchitis with leukocytosis. Patient received Rocephin while in the hospital, she is to continue antibiotic therapy in the facility to complete a course. CT scan negative for pneumonia, but revealed atelectasis. Continue budesonide , DuoNeb nebulizers, patient  is off oxygen with good oxygen saturations, admits of minimal sputum production 6. Bilateral hydronephrosis due to bladder outlet obstruction, multifactorial, bladder scan showing 999 mm in the bladder. S/p Foley catheter placement. Urology consultation appreciated,  recommended to continue Foley catheter until patient is ambulatory, follow-up in the 1-2 weeks after discharge for evaluation of voiding trial. Also left kidney, 7 mm nonobstructing stone, follow-up with urology for further recommendations as outpatient.  4.   Acute anemia. Status post EGD 02/24/2016, large duodenal ulcer,  esophagitis was noted, biopsies were taken, recommended to repeat EGD as outpatient. H. pylori testing is ordered, but not received, and is unclear if it was taken. H. pylori testing should be done as outpatient  5. Acute posthemorrhagic anemia, status post  1 unit of packed red blood cell transfusion. Hemoglobin 8.8 today, stable after transfusion. Continue Protonix.  6. Essential hypertension on metoprolol, blood pressure is somewhat higher, medications may need to be advanced as outpatient  7. Acute kidney injury with creatinine of 1.26, likely due to obstruction , resolved after blood transfusion and Foley catheter placement.  8. Weakness. Physical therapy evaluation appreciated. Skilled nursing facility is recommended, discharge today   9. Ultrasound of the lower extremities negative for DVT    VITAL SIGNS:  Blood pressure (!) 158/51, pulse 71, temperature 97.9 F (36.6 C), temperature source Oral, resp. rate 19, height '5\' 1"'$  (1.549 m), weight 54.4 kg (120 lb), SpO2 95 %.  I/O:    Intake/Output Summary (Last 24 hours) at 02/25/16 0956 Last data filed at 02/25/16 0733  Gross per 24 hour  Intake              423 ml  Output             1300 ml  Net             -877 ml    PHYSICAL EXAMINATION:  GENERAL:  71 y.o.-year-old patient lying in the bed with no acute distress.  EYES: Pupils equal, round, reactive to light and accommodation. No scleral icterus. Extraocular muscles intact.  HEENT: Head atraumatic, normocephalic. Oropharynx and nasopharynx clear.  NECK:  Supple, no jugular venous distention. No thyroid enlargement, no tenderness.  LUNGS: Normal breath sounds bilaterally, no wheezing, rales,rhonchi or crepitation. No use of accessory muscles of respiration.  CARDIOVASCULAR: S1, S2 normal. No murmurs, rubs, or gallops.  ABDOMEN: Soft, non-tender, non-distended. Bowel sounds present. No organomegaly or mass.  EXTREMITIES: No pedal edema, cyanosis, or clubbing.  NEUROLOGIC:  Cranial nerves II through XII are intact. Muscle strength 5/5 in all extremities. Sensation intact. Gait not checked.  PSYCHIATRIC: The patient is alert and oriented x 3.  SKIN: No obvious rash, lesion, or ulcer.   DATA REVIEW:   CBC  Recent Labs Lab 02/24/16 0339  WBC 12.1*  HGB 8.8*  HCT 26.0*  PLT 415    Chemistries   Recent Labs Lab 02/21/16 1341  02/23/16 0352  NA 129*  < > 136  K 4.8  < > 4.2  CL 98*  < > 105  CO2 19*  < > 25  GLUCOSE 118*  < > 78  BUN 86*  < > 52*  CREATININE 1.83*  < > 0.98  CALCIUM 8.0*  < > 7.7*  AST 28  --   --   ALT 11*  --   --   ALKPHOS 92  --   --   BILITOT 0.3  --   --   < > =  values in this interval not displayed.  Cardiac Enzymes  Recent Labs Lab 02/22/16 1157  TROPONINI 0.04*    Microbiology Results  Results for orders placed or performed during the hospital encounter of 02/21/16  Blood Culture (routine x 2)     Status: None (Preliminary result)   Collection Time: 02/21/16  1:41 PM  Result Value Ref Range Status   Specimen Description BLOOD LEFT FA  Final   Special Requests   Final    BOTTLES DRAWN AEROBIC AND ANAEROBIC AER 10ML ANA 8ML   Culture NO GROWTH 4 DAYS  Final   Report Status PENDING  Incomplete  Blood Culture (routine x 2)     Status: None (Preliminary result)   Collection Time: 02/21/16  1:41 PM  Result Value Ref Range Status   Specimen Description BLOOD RIGHT FA  Final   Special Requests   Final    BOTTLES DRAWN AEROBIC AND ANAEROBIC AER 9ML ANA 8ML   Culture NO GROWTH 4 DAYS  Final   Report Status PENDING  Incomplete  Urine culture     Status: None   Collection Time: 02/21/16  2:49 PM  Result Value Ref Range Status   Specimen Description URINE, RANDOM  Final   Special Requests NONE  Final   Culture   Final    NO GROWTH Performed at [facility], [place] [address] [place], [place] [address]    Report Status 02/22/2016 FINAL  Final  MRSA PCR Screening     Status: None   Collection Time:  02/21/16  7:45 PM  Result Value Ref Range Status   MRSA by PCR NEGATIVE NEGATIVE Final    Comment:        The GeneXpert MRSA Assay (FDA approved for NASAL specimens only), is one component of a comprehensive MRSA colonization surveillance program. It is not intended to diagnose MRSA infection nor to guide or monitor treatment for MRSA infections.     RADIOLOGY:  No results found.  EKG:   Orders placed or performed during the hospital encounter of 02/21/16  . ED EKG 12-Lead  . ED EKG 12-Lead      Management plans discussed with the patient, family and they are in agreement.  CODE STATUS:     Code Status Orders        Start     Ordered   02/21/16 2239  Full code  Continuous     02/21/16 2238    Code Status History    Date Active Date Inactive Code Status Order ID Comments User Context   02/07/2016  2:41 PM 02/07/2016  9:54 PM Full Code [id number]  Thornton Park, MD Inpatient   02/07/2016  5:42 AM 02/07/2016  2:41 PM Full Code [id number]  Saundra Shelling, MD Inpatient      TOTAL TIME TAKING CARE OF THIS PATIENT: 40 minutes.    Theodoro Grist M.D on 02/25/2016 at 9:56 AM  Between 7am to 6pm - Pager - [phone]  After 6pm go to [email or web address] - password [email or web address] [facility]  [facility]  Office  [phone]  CC: Primary care physician; No PCP Per Patient

## 2016-02-25 NOTE — Care Management Important Message (Signed)
Important Message  Patient Details  Name: Melissa Watkins MRN: [id number] Date of Birth: Aug 12, 1945   Medicare Important Message Given:  Yes    Marshell Garfinkel, RN 02/25/2016, 8:59 AM

## 2016-02-26 LAB — CULTURE, BLOOD (ROUTINE X 2)
CULTURE: NO GROWTH
Culture: NO GROWTH

## 2016-02-28 LAB — SURGICAL PATHOLOGY

## 2016-03-14 ENCOUNTER — Ambulatory Visit (INDEPENDENT_AMBULATORY_CARE_PROVIDER_SITE_OTHER): Payer: Medicare Other | Admitting: Urology

## 2016-03-14 VITALS — BP 148/77 | HR 80 | Ht 59.0 in

## 2016-03-14 DIAGNOSIS — R339 Retention of urine, unspecified: Secondary | ICD-10-CM | POA: Diagnosis not present

## 2016-03-14 DIAGNOSIS — N1339 Other hydronephrosis: Secondary | ICD-10-CM | POA: Diagnosis not present

## 2016-03-14 NOTE — Progress Notes (Signed)
.   03/14/2016 12:10 PM   Melissa Watkins 12-18-1945 [id number]  Referring provider: No referring provider defined for this encounter.  Chief Complaint  Patient presents with  . New Patient (Initial Visit)    HPI: F/u hospital consult urinary retention. Seen Feb 22, 2016 two weeks after out from RIGHT HIP HEMIARTHROPLASTY. She was readmitted from SNF. Cr rose from 0.86 to 1.2 and u/s showed bilateral hydro and > 999 ml. A foley was placed. Cr dropped to 0.98.   Today, she's in a wheelchair. She has back pain, but it sounds like it's from sitting in the wheelchair. Also, c/o stomach pain. She's getting to a bedside commode but still slow to ambulate. I called and spoke to her son [name] and the pt and we decided to leave foley for another 2 weeks.   She's a retired Scientific laboratory technician.    PMH: Past Medical History:  Diagnosis Date  . Back pain, chronic     Surgical History: Past Surgical History:  Procedure Laterality Date  . CHOLECYSTECTOMY    . COLONOSCOPY    . ESOPHAGOGASTRODUODENOSCOPY (EGD) WITH PROPOFOL N/A 02/24/2016   Procedure: ESOPHAGOGASTRODUODENOSCOPY (EGD) WITH PROPOFOL;  Surgeon: Jonathon Bellows, MD;  Location: [facility];  Service: Endoscopy;  Laterality: N/A;  . HIP ARTHROPLASTY Right 02/07/2016   Procedure: ARTHROPLASTY BIPOLAR HIP (HEMIARTHROPLASTY);  Surgeon: Thornton Park, MD;  Location: [facility] ORS;  Service: Orthopedics;  Laterality: Right;  . OOPHORECTOMY      Home Medications:  Allergies as of 03/14/2016      Reactions   Oxycodone-acetaminophen Other (See Comments)   Hallucinations   Valium [diazepam]       Medication List       Accurate as of 03/14/16 12:10 PM. Always use your most recent med list.          acetaminophen 500 MG tablet Commonly known as:  TYLENOL Take 500 mg by mouth every 12 (twelve) hours as needed.   Acidophilus Lactobacillus Caps Take 1 capsule by mouth daily. For 10 days   budesonide 0.25 MG/2ML nebulizer  solution Commonly known as:  PULMICORT Take 2 mLs (0.25 mg total) by nebulization 2 (two) times daily.   enoxaparin 30 MG/0.3ML injection Commonly known as:  LOVENOX Inject 0.3 mLs (30 mg total) into the skin daily.   feeding supplement (ENSURE ENLIVE) Liqd Take 237 mLs by mouth 2 (two) times daily between meals.   ferrous sulfate 325 (65 FE) MG tablet Take 1 tablet (325 mg total) by mouth 3 (three) times daily after meals.   HYDROcodone-acetaminophen 7.5-325 MG tablet Commonly known as:  NORCO Take 1 tablet by mouth every 4 (four) hours as needed for moderate pain.   ipratropium-albuterol 0.5-2.5 (3) MG/3ML Soln Commonly known as:  DUONEB Take 3 mLs by nebulization 3 (three) times daily.   metoprolol tartrate 25 MG tablet Commonly known as:  LOPRESSOR Take 1 tablet (25 mg total) by mouth 2 (two) times daily.   pantoprazole 40 MG tablet Commonly known as:  PROTONIX Take 1 tablet (40 mg total) by mouth daily. Switch for any other PPI at similar dose and frequency       Allergies:  Allergies  Allergen Reactions  . Oxycodone-Acetaminophen Other (See Comments)    Hallucinations  . Valium [Diazepam]     Family History: Family History  Problem Relation Age of Onset  . Asthma Son     Social History:  reports that she has been smoking Cigarettes.  She has a 50.00 pack-year smoking  history. She has never used smokeless tobacco. She reports that she does not drink alcohol or use drugs.  ROS:                                        Physical Exam: BP (!) 148/77   Pulse 80   Ht '4\' 11"'$  (1.499 m)   Constitutional:  Alert and oriented, No acute distress. HEENT: [place] AT, moist mucus membranes.  Trachea midline, no masses. Cardiovascular: No clubbing, cyanosis, or edema. Respiratory: Normal respiratory effort, no increased work of breathing. GI: Abdomen is soft, nontender, nondistended, no abdominal masses GU: No CVA tenderness. Urine clear.  Skin: No  rashes, bruises or suspicious lesions. Lymph: No adenopathy. Neurologic: Grossly intact, no focal deficits, moving all 4 extremities. Psychiatric: Normal mood and affect.  Laboratory Data: Lab Results  Component Value Date   WBC 12.1 (H) 02/24/2016   HGB 8.8 (L) 02/24/2016   HCT 26.0 (L) 02/24/2016   MCV 88.0 02/24/2016   PLT 415 02/24/2016    Lab Results  Component Value Date   CREATININE 0.98 02/23/2016    No results found for: PSA  No results found for: TESTOSTERONE  No results found for: HGBA1C  Urinalysis    Component Value Date/Time   COLORURINE YELLOW (A) 02/21/2016 1449   APPEARANCEUR CLEAR (A) 02/21/2016 1449   LABSPEC 1.014 02/21/2016 1449   PHURINE 5.0 02/21/2016 1449   GLUCOSEU NEGATIVE 02/21/2016 1449   HGBUR NEGATIVE 02/21/2016 1449   BILIRUBINUR NEGATIVE 02/21/2016 [place] 02/21/2016 1449   PROTEINUR NEGATIVE 02/21/2016 1449   NITRITE NEGATIVE 02/21/2016 1449   LEUKOCYTESUR SMALL (A) 02/21/2016 1449    Pertinent Imaging: Renal u/s  Assessment & Plan:   1) bilateral hydronephrosis - will eval with renal u/s  2) urinary retention - f/u 2 weeks for void trial. Change foley 03/24/2016.   There are no diagnoses linked to this encounter.  No Follow-up on file.  Festus Aloe, [facility] [address], [place], [place] [address] [phone]

## 2016-03-16 ENCOUNTER — Telehealth: Payer: Self-pay

## 2016-03-16 NOTE — Telephone Encounter (Signed)
LVM for pt to return my call.

## 2016-03-16 NOTE — Telephone Encounter (Signed)
-----   Message from Jonathon Bellows, MD sent at 03/09/2016  9:08 AM EST ----- Biopsies show intestinal metaplasia. She also had ulcers in duodenum- repeat EGD in 8 weeks while on omeprazole 40 mg once daily

## 2016-03-17 ENCOUNTER — Ambulatory Visit: Payer: Medicare Other

## 2016-03-23 ENCOUNTER — Ambulatory Visit
Admission: RE | Admit: 2016-03-23 | Discharge: 2016-03-23 | Disposition: A | Discharge status: Home / Self Care | Payer: Medicare Other | Source: Ambulatory Visit | Attending: Urology | Admitting: Urology

## 2016-03-23 DIAGNOSIS — N1339 Other hydronephrosis: Secondary | ICD-10-CM | POA: Diagnosis not present

## 2016-03-23 DIAGNOSIS — R339 Retention of urine, unspecified: Secondary | ICD-10-CM | POA: Insufficient documentation

## 2016-03-23 DIAGNOSIS — N281 Cyst of kidney, acquired: Secondary | ICD-10-CM | POA: Diagnosis not present

## 2016-03-23 DIAGNOSIS — R109 Unspecified abdominal pain: Secondary | ICD-10-CM | POA: Diagnosis not present

## 2016-03-23 DIAGNOSIS — J438 Other emphysema: Secondary | ICD-10-CM | POA: Diagnosis not present

## 2016-03-23 DIAGNOSIS — I10 Essential (primary) hypertension: Secondary | ICD-10-CM | POA: Diagnosis not present

## 2016-03-24 ENCOUNTER — Telehealth: Payer: Self-pay

## 2016-03-24 NOTE — Telephone Encounter (Signed)
Melissa Aloe, MD  Lestine Box, LPN        Notify patient/son - kidneys look good. Swelling in kidneys (hydronephrosis) resolved with the foley.    Spoke with pt son in reference to kidney swelling resolving with foley. Son voiced understanding.

## 2016-03-28 ENCOUNTER — Ambulatory Visit (INDEPENDENT_AMBULATORY_CARE_PROVIDER_SITE_OTHER): Payer: Medicare Other | Admitting: Urology

## 2016-03-28 ENCOUNTER — Encounter: Payer: Self-pay | Admitting: Urology

## 2016-03-28 VITALS — BP 144/81 | HR 79 | Ht 59.0 in | Wt 130.0 lb

## 2016-03-28 DIAGNOSIS — R339 Retention of urine, unspecified: Secondary | ICD-10-CM | POA: Diagnosis not present

## 2016-03-28 NOTE — Progress Notes (Signed)
Marland Kitchen   03/28/2016 10:59 AM   Melissa Watkins May 10, 1945 [id number]  Referring provider: Venia Carbon, MD [address] [place], [place] [address]  Chief Complaint  Patient presents with  . Urinary Retention    take foley out    HPI: 71 yo WF who presents today for a voiding trial after having an episode of acute urinary retention after RIGHT HIP HEMIARTHROPLASTY.  Background history F/u hospital consult urinary retention. Seen Feb 22, 2016 two weeks after out from RIGHT HIP HEMIARTHROPLASTY. She was readmitted from SNF. Cr rose from 0.86 to 1.2 and u/s showed bilateral hydro and > 999 ml. A foley was placed. Cr dropped to 0.98.  Today, she's in a wheelchair. She has back pain, but it sounds like it's from sitting in the wheelchair. Also, c/o stomach pain. She's getting to a bedside commode but still slow to ambulate.   She's a retired Scientific laboratory technician.   Foley is removed today for a voiding trial.     PMH: Past Medical History:  Diagnosis Date  . Back pain, chronic     Surgical History: Past Surgical History:  Procedure Laterality Date  . CHOLECYSTECTOMY    . COLONOSCOPY    . ESOPHAGOGASTRODUODENOSCOPY (EGD) WITH PROPOFOL N/A 02/24/2016   Procedure: ESOPHAGOGASTRODUODENOSCOPY (EGD) WITH PROPOFOL;  Surgeon: Jonathon Bellows, MD;  Location: [facility];  Service: Endoscopy;  Laterality: N/A;  . HIP ARTHROPLASTY Right 02/07/2016   Procedure: ARTHROPLASTY BIPOLAR HIP (HEMIARTHROPLASTY);  Surgeon: Thornton Park, MD;  Location: [facility] ORS;  Service: Orthopedics;  Laterality: Right;  . OOPHORECTOMY      Home Medications:  Allergies as of 03/28/2016      Reactions   Oxycodone-acetaminophen Other (See Comments)   Hallucinations   Valium [diazepam]       Medication List       Accurate as of 03/28/16 10:59 AM. Always use your most recent med list.          acetaminophen 500 MG tablet Commonly known as:  TYLENOL Take 500 mg by mouth every 12 (twelve) hours as  needed.   Acidophilus Lactobacillus Caps Take 1 capsule by mouth daily. For 10 days   budesonide 0.25 MG/2ML nebulizer solution Commonly known as:  PULMICORT Take 2 mLs (0.25 mg total) by nebulization 2 (two) times daily.   enoxaparin 30 MG/0.3ML injection Commonly known as:  LOVENOX Inject 0.3 mLs (30 mg total) into the skin daily.   feeding supplement (ENSURE ENLIVE) Liqd Take 237 mLs by mouth 2 (two) times daily between meals.   ferrous sulfate 325 (65 FE) MG tablet Take 1 tablet (325 mg total) by mouth 3 (three) times daily after meals.   HYDROcodone-acetaminophen 7.5-325 MG tablet Commonly known as:  NORCO Take 1 tablet by mouth every 4 (four) hours as needed for moderate pain.   ipratropium-albuterol 0.5-2.5 (3) MG/3ML Soln Commonly known as:  DUONEB Take 3 mLs by nebulization 3 (three) times daily.   metoprolol tartrate 25 MG tablet Commonly known as:  LOPRESSOR Take 1 tablet (25 mg total) by mouth 2 (two) times daily.   pantoprazole 40 MG tablet Commonly known as:  PROTONIX Take 1 tablet (40 mg total) by mouth daily. Switch for any other PPI at similar dose and frequency       Allergies:  Allergies  Allergen Reactions  . Oxycodone-Acetaminophen Other (See Comments)    Hallucinations  . Valium [Diazepam]     Family History: Family History  Problem Relation Age of Onset  .  Asthma Son   . Prostate cancer Neg Hx   . Kidney cancer Neg Hx   . Bladder Cancer Neg Hx     Social History:  reports that she has quit smoking. Her smoking use included Cigarettes. She has a 50.00 pack-year smoking history. She has never used smokeless tobacco. She reports that she does not drink alcohol or use drugs.  ROS: UROLOGY Frequent Urination?: No Hard to postpone urination?: No Burning/pain with urination?: No Get up at night to urinate?: No Leakage of urine?: No Urine stream starts and stops?: No Trouble starting stream?: No Do you have to strain to urinate?:  No Blood in urine?: No Urinary tract infection?: No Sexually transmitted disease?: No Injury to kidneys or bladder?: No Painful intercourse?: No Weak stream?: No Currently pregnant?: No Vaginal bleeding?: No Last menstrual period?: n  Gastrointestinal Nausea?: No Vomiting?: No Indigestion/heartburn?: No Diarrhea?: No Constipation?: No  Constitutional Fever: No Night sweats?: No Weight loss?: No Fatigue?: No  Skin Skin rash/lesions?: No Itching?: No  Eyes Blurred vision?: No Double vision?: No  Ears/Nose/Throat Sore throat?: No Sinus problems?: No  Hematologic/Lymphatic Swollen glands?: No Easy bruising?: No  Cardiovascular Leg swelling?: No Chest pain?: No  Respiratory Cough?: No Shortness of breath?: No  Endocrine Excessive thirst?: No  Musculoskeletal Back pain?: No Joint pain?: No  Neurological Headaches?: No Dizziness?: No  Psychologic Depression?: No Anxiety?: No  Physical Exam: BP (!) 144/81   Pulse 79   Ht '4\' 11"'$  (1.499 m)   Wt 130 lb (59 kg)   BMI 26.26 kg/m   Constitutional:  Alert and oriented, No acute distress. HEENT: [place] AT, moist mucus membranes.  Trachea midline, no masses. Cardiovascular: No clubbing, cyanosis, or edema. Respiratory: Normal respiratory effort, no increased work of breathing. GI: Abdomen is soft, nontender, nondistended, no abdominal masses GU: No CVA tenderness. Urine clear.  Skin: No rashes, bruises or suspicious lesions. Lymph: No adenopathy. Neurologic: Grossly intact, no focal deficits, moving all 4 extremities. Psychiatric: Normal mood and affect.  Laboratory Data: Lab Results  Component Value Date   WBC 12.1 (H) 02/24/2016   HGB 8.8 (L) 02/24/2016   HCT 26.0 (L) 02/24/2016   MCV 88.0 02/24/2016   PLT 415 02/24/2016    Lab Results  Component Value Date   CREATININE 0.98 02/23/2016    No results found for: PSA  No results found for: TESTOSTERONE  No results found for:  HGBA1C  Urinalysis    Component Value Date/Time   COLORURINE YELLOW (A) 02/21/2016 1449   APPEARANCEUR CLEAR (A) 02/21/2016 1449   LABSPEC 1.014 02/21/2016 1449   PHURINE 5.0 02/21/2016 1449   GLUCOSEU NEGATIVE 02/21/2016 [place] 02/21/2016 1449   BILIRUBINUR NEGATIVE 02/21/2016 1449   KETONESUR NEGATIVE 02/21/2016 1449   PROTEINUR NEGATIVE 02/21/2016 1449   NITRITE NEGATIVE 02/21/2016 1449   LEUKOCYTESUR SMALL (A) 02/21/2016 1449    Pertinent Imaging: Renal u/s  Assessment & Plan:   1) bilateral hydronephrosis - resolved  2) urinary retention    - foley catheter removed  -voiding trial today    -return if unable to urinate or experiencing suprapubic discomfort by 3 pm   -follow-up in one week  for  PVR and exam.   Return in about 1 week (around 04/04/2016) for PVR.  Zara Council, [facility] [address] [place], [place] [address] [phone]

## 2016-03-28 NOTE — Progress Notes (Signed)
Catheter Removal  Patient is present today for a catheter removal.  54m of water was drained from the balloon. A 16FR foley cath was removed from the bladder no complications were noted . Patient tolerated well.  Preformed by: RLyndee HensenCMA

## 2016-04-03 NOTE — Progress Notes (Signed)
.   04/04/2016 10:29 AM   Melissa Watkins 1945-04-21 [id number]  Referring provider: Venia Carbon, MD [place], [place] [address]  Chief Complaint  Patient presents with  . Follow-up    1 week urinary retention    HPI: 71 yo WF who presents today for a one week follow up after having a voiding trial after having an episode of acute urinary retention after RIGHT HIP HEMIARTHROPLASTY.  Background history F/u hospital consult urinary retention. Seen Feb 22, 2016 two weeks after out from RIGHT HIP HEMIARTHROPLASTY. She was readmitted from SNF. Cr rose from 0.86 to 1.2 and u/s showed bilateral hydro and > 999 ml. A foley was placed. Cr dropped to 0.98.  Today, she's in a wheelchair. She has back pain, but it sounds like it's from sitting in the wheelchair. Also, c/o stomach pain. She's getting to a bedside commode but still slow to ambulate.   She's a retired Scientific laboratory technician.  One week ago the Foley was removed for a voiding trial.  Since the catheter has been removed, she has been voiding.  She is not having suprapubic pain at this time.  She is not experiencing dysuria or gross hematuria.  She is not having fevers, chills, nausea or vomiting.  Her PVR today is 488 mL.     PMH: Past Medical History:  Diagnosis Date  . Back pain, chronic     Surgical History: Past Surgical History:  Procedure Laterality Date  . CHOLECYSTECTOMY    . COLONOSCOPY    . ESOPHAGOGASTRODUODENOSCOPY (EGD) WITH PROPOFOL N/A 02/24/2016   Procedure: ESOPHAGOGASTRODUODENOSCOPY (EGD) WITH PROPOFOL;  Surgeon: Melissa Bellows, MD;  Location: [facility];  Service: Endoscopy;  Laterality: N/A;  . HIP ARTHROPLASTY Right 02/07/2016   Procedure: ARTHROPLASTY BIPOLAR HIP (HEMIARTHROPLASTY);  Surgeon: Melissa Park, MD;  Location: [facility] ORS;  Service: Orthopedics;  Laterality: Right;  . OOPHORECTOMY      Home Medications:  Allergies as of 04/04/2016      Reactions   Oxycodone-acetaminophen  Other (See Comments)   Hallucinations   Valium [diazepam]       Medication List       Accurate as of 04/04/16 10:29 AM. Always use your most recent med list.          acetaminophen 500 MG tablet Commonly known as:  TYLENOL Take 500 mg by mouth every 12 (twelve) hours as needed.   Acidophilus Lactobacillus Caps Take 1 capsule by mouth daily. For 10 days   budesonide 0.25 MG/2ML nebulizer solution Commonly known as:  PULMICORT Take 2 mLs (0.25 mg total) by nebulization 2 (two) times daily.   enoxaparin 30 MG/0.3ML injection Commonly known as:  LOVENOX Inject 0.3 mLs (30 mg total) into the skin daily.   feeding supplement (ENSURE ENLIVE) Liqd Take 237 mLs by mouth 2 (two) times daily between meals.   ferrous sulfate 325 (65 FE) MG tablet Take 1 tablet (325 mg total) by mouth 3 (three) times daily after meals.   HYDROcodone-acetaminophen 7.5-325 MG tablet Commonly known as:  NORCO Take 1 tablet by mouth every 4 (four) hours as needed for moderate pain.   ipratropium-albuterol 0.5-2.5 (3) MG/3ML Soln Commonly known as:  DUONEB Take 3 mLs by nebulization 3 (three) times daily.   metoprolol tartrate 25 MG tablet Commonly known as:  LOPRESSOR Take 1 tablet (25 mg total) by mouth 2 (two) times daily.   pantoprazole 40 MG tablet Commonly known as:  PROTONIX Take 1 tablet (40 mg  total) by mouth daily. Switch for any other PPI at similar dose and frequency       Allergies:  Allergies  Allergen Reactions  . Oxycodone-Acetaminophen Other (See Comments)    Hallucinations  . Valium [Diazepam]     Family History: Family History  Problem Relation Age of Onset  . Asthma Son   . Prostate cancer Neg Hx   . Kidney cancer Neg Hx   . Bladder Cancer Neg Hx     Social History:  reports that she has been smoking Cigarettes.  She has a 50.00 pack-year smoking history. She has never used smokeless tobacco. She reports that she does not drink alcohol or use  drugs.  ROS: UROLOGY Frequent Urination?: No Hard to postpone urination?: No Burning/pain with urination?: No Get up at night to urinate?: No Leakage of urine?: No Urine stream starts and stops?: No Trouble starting stream?: No Do you have to strain to urinate?: No Blood in urine?: No Urinary tract infection?: No Sexually transmitted disease?: No Injury to kidneys or bladder?: No Painful intercourse?: No Weak stream?: No Currently pregnant?: No Vaginal bleeding?: No Last menstrual period?: n/a  Gastrointestinal Nausea?: No Vomiting?: No Indigestion/heartburn?: No Diarrhea?: No Constipation?: No  Constitutional Fever: No Night sweats?: No Weight loss?: No Fatigue?: No  Skin Skin rash/lesions?: No Itching?: No  Eyes Blurred vision?: No Double vision?: No  Ears/Nose/Throat Sore throat?: No Sinus problems?: No  Hematologic/Lymphatic Swollen glands?: No Easy bruising?: No  Cardiovascular Leg swelling?: No Chest pain?: No  Respiratory Cough?: No Shortness of breath?: No  Endocrine Excessive thirst?: No  Musculoskeletal Back pain?: No Joint pain?: No  Neurological Headaches?: No Dizziness?: No  Psychologic Depression?: No Anxiety?: No  Physical Exam: BP (!) 179/117   Pulse (!) 108   Ht '4\' 11"'$  (1.499 m)   Wt 130 lb (59 kg)   BMI 26.26 kg/m   Constitutional:  Alert and oriented, No acute distress. HEENT: [place] AT, moist mucus membranes.  Trachea midline, no masses. Cardiovascular: No clubbing, cyanosis, or edema. Respiratory: Normal respiratory effort, no increased work of breathing. GI: Abdomen is soft, nontender, nondistended, no abdominal masses GU: No CVA tenderness. Urine clear.  Skin: No rashes, bruises or suspicious lesions. Lymph: No adenopathy. Neurologic: Grossly intact, no focal deficits, moving all 4 extremities. Psychiatric: Normal mood and affect.  Laboratory Data: Lab Results  Component Value Date   WBC 12.1 (H)  02/24/2016   HGB 8.8 (L) 02/24/2016   HCT 26.0 (L) 02/24/2016   MCV 88.0 02/24/2016   PLT 415 02/24/2016    Lab Results  Component Value Date   CREATININE 0.98 02/23/2016    Pertinent Imaging: Results for Melissa Watkins (MRN [id number]) as of 04/04/2016 10:25  Ref. Range 04/04/2016 10:17  Scan Result Unknown 488    Assessment & Plan:    1. 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    - follow up in one month for PVR and symptom recheck  Return in about 1 month (around 05/05/2016) for PVR and OAB questionnaire.  Zara Council, [facility] [address], [place], [place] [address] [phone]

## 2016-04-04 ENCOUNTER — Encounter: Payer: Self-pay | Admitting: Urology

## 2016-04-04 ENCOUNTER — Ambulatory Visit (INDEPENDENT_AMBULATORY_CARE_PROVIDER_SITE_OTHER): Payer: Medicare Other | Admitting: Urology

## 2016-04-04 VITALS — BP 179/117 | HR 108 | Ht 59.0 in | Wt 130.0 lb

## 2016-04-04 DIAGNOSIS — K269 Duodenal ulcer, unspecified as acute or chronic, without hemorrhage or perforation: Secondary | ICD-10-CM | POA: Diagnosis not present

## 2016-04-04 DIAGNOSIS — S72001D Fracture of unspecified part of neck of right femur, subsequent encounter for closed fracture with routine healing: Secondary | ICD-10-CM | POA: Diagnosis not present

## 2016-04-04 DIAGNOSIS — R339 Retention of urine, unspecified: Secondary | ICD-10-CM

## 2016-04-04 DIAGNOSIS — J449 Chronic obstructive pulmonary disease, unspecified: Secondary | ICD-10-CM | POA: Diagnosis not present

## 2016-04-04 DIAGNOSIS — M810 Age-related osteoporosis without current pathological fracture: Secondary | ICD-10-CM | POA: Diagnosis not present

## 2016-04-04 DIAGNOSIS — Z8781 Personal history of (healed) traumatic fracture: Secondary | ICD-10-CM | POA: Diagnosis not present

## 2016-04-04 DIAGNOSIS — N320 Bladder-neck obstruction: Secondary | ICD-10-CM | POA: Diagnosis not present

## 2016-04-04 DIAGNOSIS — G8929 Other chronic pain: Secondary | ICD-10-CM | POA: Diagnosis not present

## 2016-04-04 DIAGNOSIS — I10 Essential (primary) hypertension: Secondary | ICD-10-CM | POA: Diagnosis not present

## 2016-04-04 DIAGNOSIS — N1330 Unspecified hydronephrosis: Secondary | ICD-10-CM | POA: Diagnosis not present

## 2016-04-04 DIAGNOSIS — Z9181 History of falling: Secondary | ICD-10-CM | POA: Diagnosis not present

## 2016-04-04 DIAGNOSIS — M549 Dorsalgia, unspecified: Secondary | ICD-10-CM | POA: Diagnosis not present

## 2016-04-04 DIAGNOSIS — F17210 Nicotine dependence, cigarettes, uncomplicated: Secondary | ICD-10-CM | POA: Diagnosis not present

## 2016-04-04 DIAGNOSIS — D649 Anemia, unspecified: Secondary | ICD-10-CM | POA: Diagnosis not present

## 2016-04-04 DIAGNOSIS — Z96641 Presence of right artificial hip joint: Secondary | ICD-10-CM | POA: Diagnosis not present

## 2016-04-04 LAB — BLADDER SCAN AMB NON-IMAGING: Scan Result: 488

## 2016-04-04 NOTE — Progress Notes (Signed)
Continuous Intermittent Catheterization  Due to urinary retention patient is present today for a teaching of self I & O Catheterization. Patient was given detailed verbal and printed instructions of self catheterization. Patient was cleaned and prepped in a sterile fashion.  With instruction and assistance patient inserted a 14FR and urine return was noted 410 ml, urine was yellow in color. Patient tolerated well, no complications were noted. Patient was given a sample bag with supplies to take home.  Instructions were given per Zara Council PA-C for patient to cath three  times daily.  An order was placed with [facility] for catheters to be sent to the patient's home. Patient is to follow up in one month.  Preformed by: Patient and Lyndee Hensen CMA

## 2016-04-06 DIAGNOSIS — N320 Bladder-neck obstruction: Secondary | ICD-10-CM | POA: Diagnosis not present

## 2016-04-06 DIAGNOSIS — S72001D Fracture of unspecified part of neck of right femur, subsequent encounter for closed fracture with routine healing: Secondary | ICD-10-CM | POA: Diagnosis not present

## 2016-04-06 DIAGNOSIS — N1330 Unspecified hydronephrosis: Secondary | ICD-10-CM | POA: Diagnosis not present

## 2016-04-06 DIAGNOSIS — D649 Anemia, unspecified: Secondary | ICD-10-CM | POA: Diagnosis not present

## 2016-04-06 DIAGNOSIS — J449 Chronic obstructive pulmonary disease, unspecified: Secondary | ICD-10-CM | POA: Diagnosis not present

## 2016-04-06 DIAGNOSIS — K269 Duodenal ulcer, unspecified as acute or chronic, without hemorrhage or perforation: Secondary | ICD-10-CM | POA: Diagnosis not present

## 2016-04-10 DIAGNOSIS — J449 Chronic obstructive pulmonary disease, unspecified: Secondary | ICD-10-CM | POA: Diagnosis not present

## 2016-04-10 DIAGNOSIS — K269 Duodenal ulcer, unspecified as acute or chronic, without hemorrhage or perforation: Secondary | ICD-10-CM | POA: Diagnosis not present

## 2016-04-10 DIAGNOSIS — N320 Bladder-neck obstruction: Secondary | ICD-10-CM | POA: Diagnosis not present

## 2016-04-10 DIAGNOSIS — S72001D Fracture of unspecified part of neck of right femur, subsequent encounter for closed fracture with routine healing: Secondary | ICD-10-CM | POA: Diagnosis not present

## 2016-04-10 DIAGNOSIS — N1330 Unspecified hydronephrosis: Secondary | ICD-10-CM | POA: Diagnosis not present

## 2016-04-10 DIAGNOSIS — D649 Anemia, unspecified: Secondary | ICD-10-CM | POA: Diagnosis not present

## 2016-04-12 ENCOUNTER — Encounter: Payer: Self-pay | Admitting: Internal Medicine

## 2016-04-12 ENCOUNTER — Other Ambulatory Visit: Payer: Self-pay | Admitting: Internal Medicine

## 2016-04-12 DIAGNOSIS — N320 Bladder-neck obstruction: Secondary | ICD-10-CM | POA: Diagnosis not present

## 2016-04-12 DIAGNOSIS — N1330 Unspecified hydronephrosis: Secondary | ICD-10-CM | POA: Diagnosis not present

## 2016-04-12 DIAGNOSIS — M810 Age-related osteoporosis without current pathological fracture: Secondary | ICD-10-CM | POA: Insufficient documentation

## 2016-04-12 DIAGNOSIS — M1990 Unspecified osteoarthritis, unspecified site: Secondary | ICD-10-CM | POA: Insufficient documentation

## 2016-04-12 DIAGNOSIS — I10 Essential (primary) hypertension: Secondary | ICD-10-CM | POA: Insufficient documentation

## 2016-04-12 DIAGNOSIS — D649 Anemia, unspecified: Secondary | ICD-10-CM | POA: Diagnosis not present

## 2016-04-12 DIAGNOSIS — S72001D Fracture of unspecified part of neck of right femur, subsequent encounter for closed fracture with routine healing: Secondary | ICD-10-CM | POA: Diagnosis not present

## 2016-04-12 DIAGNOSIS — K269 Duodenal ulcer, unspecified as acute or chronic, without hemorrhage or perforation: Secondary | ICD-10-CM | POA: Diagnosis not present

## 2016-04-12 DIAGNOSIS — J449 Chronic obstructive pulmonary disease, unspecified: Secondary | ICD-10-CM | POA: Diagnosis not present

## 2016-04-13 DIAGNOSIS — S72001D Fracture of unspecified part of neck of right femur, subsequent encounter for closed fracture with routine healing: Secondary | ICD-10-CM | POA: Diagnosis not present

## 2016-04-13 DIAGNOSIS — J449 Chronic obstructive pulmonary disease, unspecified: Secondary | ICD-10-CM | POA: Diagnosis not present

## 2016-04-13 DIAGNOSIS — N1330 Unspecified hydronephrosis: Secondary | ICD-10-CM | POA: Diagnosis not present

## 2016-04-13 DIAGNOSIS — D649 Anemia, unspecified: Secondary | ICD-10-CM | POA: Diagnosis not present

## 2016-04-13 DIAGNOSIS — N320 Bladder-neck obstruction: Secondary | ICD-10-CM | POA: Diagnosis not present

## 2016-04-13 DIAGNOSIS — K269 Duodenal ulcer, unspecified as acute or chronic, without hemorrhage or perforation: Secondary | ICD-10-CM | POA: Diagnosis not present

## 2016-04-14 DIAGNOSIS — N1330 Unspecified hydronephrosis: Secondary | ICD-10-CM | POA: Diagnosis not present

## 2016-04-14 DIAGNOSIS — D649 Anemia, unspecified: Secondary | ICD-10-CM | POA: Diagnosis not present

## 2016-04-14 DIAGNOSIS — K269 Duodenal ulcer, unspecified as acute or chronic, without hemorrhage or perforation: Secondary | ICD-10-CM | POA: Diagnosis not present

## 2016-04-14 DIAGNOSIS — J449 Chronic obstructive pulmonary disease, unspecified: Secondary | ICD-10-CM | POA: Diagnosis not present

## 2016-04-14 DIAGNOSIS — S72001D Fracture of unspecified part of neck of right femur, subsequent encounter for closed fracture with routine healing: Secondary | ICD-10-CM | POA: Diagnosis not present

## 2016-04-14 DIAGNOSIS — N320 Bladder-neck obstruction: Secondary | ICD-10-CM | POA: Diagnosis not present

## 2016-04-14 NOTE — Telephone Encounter (Signed)
Tried contact but phone was not in service. Mailed letter.

## 2016-04-17 DIAGNOSIS — S72001D Fracture of unspecified part of neck of right femur, subsequent encounter for closed fracture with routine healing: Secondary | ICD-10-CM | POA: Diagnosis not present

## 2016-04-17 DIAGNOSIS — N1330 Unspecified hydronephrosis: Secondary | ICD-10-CM | POA: Diagnosis not present

## 2016-04-17 DIAGNOSIS — N320 Bladder-neck obstruction: Secondary | ICD-10-CM | POA: Diagnosis not present

## 2016-04-17 DIAGNOSIS — K269 Duodenal ulcer, unspecified as acute or chronic, without hemorrhage or perforation: Secondary | ICD-10-CM | POA: Diagnosis not present

## 2016-04-17 DIAGNOSIS — J449 Chronic obstructive pulmonary disease, unspecified: Secondary | ICD-10-CM | POA: Diagnosis not present

## 2016-04-17 DIAGNOSIS — D649 Anemia, unspecified: Secondary | ICD-10-CM | POA: Diagnosis not present

## 2016-04-18 DIAGNOSIS — N320 Bladder-neck obstruction: Secondary | ICD-10-CM | POA: Diagnosis not present

## 2016-04-18 DIAGNOSIS — S72001D Fracture of unspecified part of neck of right femur, subsequent encounter for closed fracture with routine healing: Secondary | ICD-10-CM | POA: Diagnosis not present

## 2016-04-18 DIAGNOSIS — J449 Chronic obstructive pulmonary disease, unspecified: Secondary | ICD-10-CM | POA: Diagnosis not present

## 2016-04-18 DIAGNOSIS — N1330 Unspecified hydronephrosis: Secondary | ICD-10-CM | POA: Diagnosis not present

## 2016-04-18 DIAGNOSIS — K269 Duodenal ulcer, unspecified as acute or chronic, without hemorrhage or perforation: Secondary | ICD-10-CM | POA: Diagnosis not present

## 2016-04-18 DIAGNOSIS — D649 Anemia, unspecified: Secondary | ICD-10-CM | POA: Diagnosis not present

## 2016-04-19 DIAGNOSIS — J449 Chronic obstructive pulmonary disease, unspecified: Secondary | ICD-10-CM | POA: Diagnosis not present

## 2016-04-19 DIAGNOSIS — N1330 Unspecified hydronephrosis: Secondary | ICD-10-CM | POA: Diagnosis not present

## 2016-04-19 DIAGNOSIS — K269 Duodenal ulcer, unspecified as acute or chronic, without hemorrhage or perforation: Secondary | ICD-10-CM | POA: Diagnosis not present

## 2016-04-19 DIAGNOSIS — S72001D Fracture of unspecified part of neck of right femur, subsequent encounter for closed fracture with routine healing: Secondary | ICD-10-CM | POA: Diagnosis not present

## 2016-04-19 DIAGNOSIS — N320 Bladder-neck obstruction: Secondary | ICD-10-CM | POA: Diagnosis not present

## 2016-04-19 DIAGNOSIS — D649 Anemia, unspecified: Secondary | ICD-10-CM | POA: Diagnosis not present

## 2016-04-20 DIAGNOSIS — J449 Chronic obstructive pulmonary disease, unspecified: Secondary | ICD-10-CM | POA: Diagnosis not present

## 2016-04-20 DIAGNOSIS — N1330 Unspecified hydronephrosis: Secondary | ICD-10-CM | POA: Diagnosis not present

## 2016-04-20 DIAGNOSIS — K269 Duodenal ulcer, unspecified as acute or chronic, without hemorrhage or perforation: Secondary | ICD-10-CM | POA: Diagnosis not present

## 2016-04-20 DIAGNOSIS — D649 Anemia, unspecified: Secondary | ICD-10-CM | POA: Diagnosis not present

## 2016-04-20 DIAGNOSIS — N320 Bladder-neck obstruction: Secondary | ICD-10-CM | POA: Diagnosis not present

## 2016-04-20 DIAGNOSIS — S72001D Fracture of unspecified part of neck of right femur, subsequent encounter for closed fracture with routine healing: Secondary | ICD-10-CM | POA: Diagnosis not present

## 2016-04-21 ENCOUNTER — Ambulatory Visit (INDEPENDENT_AMBULATORY_CARE_PROVIDER_SITE_OTHER): Payer: Medicare Other | Admitting: Internal Medicine

## 2016-04-21 ENCOUNTER — Encounter (INDEPENDENT_AMBULATORY_CARE_PROVIDER_SITE_OTHER): Payer: Self-pay

## 2016-04-21 ENCOUNTER — Encounter: Payer: Self-pay | Admitting: Internal Medicine

## 2016-04-21 VITALS — BP 140/88 | HR 82 | Temp 98.0°F | Resp 24

## 2016-04-21 DIAGNOSIS — K269 Duodenal ulcer, unspecified as acute or chronic, without hemorrhage or perforation: Secondary | ICD-10-CM | POA: Diagnosis not present

## 2016-04-21 DIAGNOSIS — S72001G Fracture of unspecified part of neck of right femur, subsequent encounter for closed fracture with delayed healing: Secondary | ICD-10-CM | POA: Diagnosis not present

## 2016-04-21 DIAGNOSIS — D649 Anemia, unspecified: Secondary | ICD-10-CM | POA: Diagnosis not present

## 2016-04-21 DIAGNOSIS — I10 Essential (primary) hypertension: Secondary | ICD-10-CM

## 2016-04-21 DIAGNOSIS — N320 Bladder-neck obstruction: Secondary | ICD-10-CM

## 2016-04-21 DIAGNOSIS — S72001D Fracture of unspecified part of neck of right femur, subsequent encounter for closed fracture with routine healing: Secondary | ICD-10-CM | POA: Diagnosis not present

## 2016-04-21 DIAGNOSIS — J449 Chronic obstructive pulmonary disease, unspecified: Secondary | ICD-10-CM

## 2016-04-21 DIAGNOSIS — N1330 Unspecified hydronephrosis: Secondary | ICD-10-CM | POA: Diagnosis not present

## 2016-04-21 MED ORDER — FLUTICASONE FUROATE-VILANTEROL 200-25 MCG/INH IN AEPB: 1.0000 | INHALATION_SPRAY | Freq: Every day | RESPIRATORY_TRACT | 11 refills | Status: DC

## 2016-04-21 MED ORDER — ALBUTEROL SULFATE HFA 108 (90 BASE) MCG/ACT IN AERS: 2.0000 | INHALATION_SPRAY | Freq: Four times a day (QID) | RESPIRATORY_TRACT | 3 refills | Status: DC | PRN

## 2016-04-21 NOTE — Progress Notes (Signed)
Subjective:    Patient ID: Melissa Watkins, female    DOB: 09-13-1945, 71 y.o.   MRN: [id number]  HPI Here for hospital and rehab follow up  Reviewed note from OT---limited progress due to dyspnea with limited activity O2 sats have been okay Still smokes-- but not much. Down to about 3 a day. No regular cough  She is glad to be home Has straightened out the issues with the home (from the skilled nursing)  Gets herself dressed Needs help with bath Uses the bathroom on her own---no incontinence Hopes to start doing some work around the house Son does the instrumental ADLs  Awakens with hip pain at times Has run out of the norco Only using tylenol  Had urinary retention and foley This was removed about a month ago Has caths for in and out self cath--but she hasn't needed this lately  Current Outpatient Prescriptions on File Prior to Visit  Medication Sig Dispense Refill  . Acidophilus Lactobacillus CAPS Take 1 capsule by mouth daily. For 10 days    . feeding supplement, ENSURE ENLIVE, (ENSURE ENLIVE) LIQD Take 237 mLs by mouth 2 (two) times daily between meals. 237 mL 12  . HYDROcodone-acetaminophen (NORCO) 7.5-325 MG tablet Take 1 tablet by mouth every 4 (four) hours as needed for moderate pain. 15 tablet 0  . ipratropium-albuterol (DUONEB) 0.5-2.5 (3) MG/3ML SOLN Take 3 mLs by nebulization 3 (three) times daily. 360 mL 6  . metoprolol tartrate (LOPRESSOR) 25 MG tablet Take 1 tablet (25 mg total) by mouth 2 (two) times daily. 60 tablet 2  . pantoprazole (PROTONIX) 40 MG tablet Take 1 tablet (40 mg total) by mouth daily. Switch for any other PPI at similar dose and frequency 30 tablet 3   No current facility-administered medications on file prior to visit.     Allergies  Allergen Reactions  . Oxycodone-Acetaminophen Other (See Comments)    Hallucinations  . Valium [Diazepam]     Past Medical History:  Diagnosis Date  . Back pain, chronic   . COPD (chronic obstructive  pulmonary disease) ([place])   . Duodenal ulcer 01/2016   and esophagitis (hospitalized)  . Essential hypertension, benign   . Essential hypertension, benign   . Osteoarthritis   . Osteoporosis     Past Surgical History:  Procedure Laterality Date  . CHOLECYSTECTOMY    . COLONOSCOPY    . ESOPHAGOGASTRODUODENOSCOPY (EGD) WITH PROPOFOL N/A 02/24/2016   Procedure: ESOPHAGOGASTRODUODENOSCOPY (EGD) WITH PROPOFOL;  Surgeon: Jonathon Bellows, MD;  Location: [facility] ENDOSCOPY;  Service: Endoscopy;  Laterality: N/A;  . HIP ARTHROPLASTY Right 02/07/2016   Procedure: ARTHROPLASTY BIPOLAR HIP (HEMIARTHROPLASTY);  Surgeon: Thornton Park, MD;  Location: [facility] ORS;  Service: Orthopedics;  Laterality: Right;  . HIP FRACTURE SURGERY Right 01/2016  . OOPHORECTOMY      Family History  Problem Relation Age of Onset  . Cancer Mother   . COPD Father   . Asthma Son   . Prostate cancer Neg Hx   . Kidney cancer Neg Hx   . Bladder Cancer Neg Hx     Social History   Social History  . Marital status: Widowed    Spouse name: N/A  . Number of children: 4  . Years of education: N/A   Occupational History  . Nurse (travel and hemodialysis)     Retired   Social History Main Topics  . Smoking status: Current Some Day Smoker    Packs/day: 1.00    Years: 50.00  Types: Cigarettes  . Smokeless tobacco: Never Used     Comment: for 30 days  . Alcohol use No     Comment: occasional  . Drug use: No  . Sexual activity: Not on file   Other Topics Concern  . Not on file   Social History Narrative   No living will   4 children (2 of each). Son lives with her   DNR done 02/25/16 ("if it is time to go, it is time to go")   Review of Systems Appetite is starting to improve Weight seems to be stabilized now Usually sleeps okay Bowels are okay    Objective:   Physical Exam  Constitutional: No distress.  Neck: No thyromegaly present.  Cardiovascular: Normal rate, regular rhythm and normal heart sounds.  Exam  reveals no gallop.   No murmur heard. Pulmonary/Chest: Effort normal. No respiratory distress. She has no wheezes. She has no rales.  Decreased breath sounds but clear  Abdominal: Soft. There is no tenderness.  Musculoskeletal: She exhibits no edema.  Lymphadenopathy:    She has no cervical adenopathy.  Psychiatric: She has a normal mood and affect. Her behavior is normal.          Assessment & Plan:

## 2016-04-21 NOTE — Assessment & Plan Note (Signed)
Doing well Improving with function--but limited by breathing Order to continue OT for next 2 weeks

## 2016-04-21 NOTE — Assessment & Plan Note (Signed)
Now voiding better and catheter is out

## 2016-04-21 NOTE — Assessment & Plan Note (Signed)
Will start breo Consider pulmonary referral

## 2016-04-21 NOTE — Assessment & Plan Note (Signed)
BP Readings from Last 3 Encounters:  04/21/16 140/88  04/04/16 (!) 179/117  03/28/16 (!) 144/81   Good control now

## 2016-04-21 NOTE — Progress Notes (Signed)
Pre visit review using our clinic review tool, if applicable. No additional management support is needed unless otherwise documented below in the visit note. 

## 2016-04-27 DIAGNOSIS — N320 Bladder-neck obstruction: Secondary | ICD-10-CM | POA: Diagnosis not present

## 2016-04-27 DIAGNOSIS — K269 Duodenal ulcer, unspecified as acute or chronic, without hemorrhage or perforation: Secondary | ICD-10-CM | POA: Diagnosis not present

## 2016-04-27 DIAGNOSIS — N1330 Unspecified hydronephrosis: Secondary | ICD-10-CM | POA: Diagnosis not present

## 2016-04-27 DIAGNOSIS — S72001D Fracture of unspecified part of neck of right femur, subsequent encounter for closed fracture with routine healing: Secondary | ICD-10-CM | POA: Diagnosis not present

## 2016-04-27 DIAGNOSIS — D649 Anemia, unspecified: Secondary | ICD-10-CM | POA: Diagnosis not present

## 2016-04-27 DIAGNOSIS — J449 Chronic obstructive pulmonary disease, unspecified: Secondary | ICD-10-CM | POA: Diagnosis not present

## 2016-04-30 DIAGNOSIS — D649 Anemia, unspecified: Secondary | ICD-10-CM | POA: Diagnosis not present

## 2016-04-30 DIAGNOSIS — S72001D Fracture of unspecified part of neck of right femur, subsequent encounter for closed fracture with routine healing: Secondary | ICD-10-CM | POA: Diagnosis not present

## 2016-04-30 DIAGNOSIS — N320 Bladder-neck obstruction: Secondary | ICD-10-CM | POA: Diagnosis not present

## 2016-04-30 DIAGNOSIS — J449 Chronic obstructive pulmonary disease, unspecified: Secondary | ICD-10-CM | POA: Diagnosis not present

## 2016-04-30 DIAGNOSIS — K269 Duodenal ulcer, unspecified as acute or chronic, without hemorrhage or perforation: Secondary | ICD-10-CM | POA: Diagnosis not present

## 2016-04-30 DIAGNOSIS — N1330 Unspecified hydronephrosis: Secondary | ICD-10-CM | POA: Diagnosis not present

## 2016-05-01 ENCOUNTER — Telehealth: Payer: Self-pay | Admitting: *Deleted

## 2016-05-01 ENCOUNTER — Telehealth: Payer: Self-pay | Admitting: Urology

## 2016-05-01 DIAGNOSIS — N320 Bladder-neck obstruction: Secondary | ICD-10-CM | POA: Diagnosis not present

## 2016-05-01 DIAGNOSIS — N1330 Unspecified hydronephrosis: Secondary | ICD-10-CM | POA: Diagnosis not present

## 2016-05-01 DIAGNOSIS — S72001D Fracture of unspecified part of neck of right femur, subsequent encounter for closed fracture with routine healing: Secondary | ICD-10-CM | POA: Diagnosis not present

## 2016-05-01 DIAGNOSIS — D649 Anemia, unspecified: Secondary | ICD-10-CM | POA: Diagnosis not present

## 2016-05-01 DIAGNOSIS — J449 Chronic obstructive pulmonary disease, unspecified: Secondary | ICD-10-CM | POA: Diagnosis not present

## 2016-05-01 DIAGNOSIS — K269 Duodenal ulcer, unspecified as acute or chronic, without hemorrhage or perforation: Secondary | ICD-10-CM | POA: Diagnosis not present

## 2016-05-01 NOTE — Telephone Encounter (Signed)
Spoke to [place] who states that pts [facility] is limiting walking and daily activity. She states that [employer] is costing $300 and pt was unable to pick it up from the  Pharmacy. Izora Gala believes she may need a long term medication as her O2 sat is staying above 90% but pt is very winded. Questions if she may need nebulizer and solution for daily use. Pls advise

## 2016-05-01 NOTE — Telephone Encounter (Signed)
That's fine is patient wants to have an in dwelling Foley.  We can discuss it further when she returns for her follow-up visit on April 16. She may want to consider a suprapubic tube in the future. We will discuss that further on the 16th.

## 2016-05-01 NOTE — Telephone Encounter (Signed)
Please check with her pharmacy and find out if there is a less expensive substitute for breo

## 2016-05-01 NOTE — Telephone Encounter (Signed)
Spoke with Rip Harbour and she states patient does not want to self cath, having a hard time would rather have an indwelling catheter. Would like to know the plan for that. I let her know that patient has a return appointment for recheck on April 16th but I would let [facility] know. Ok with plan.

## 2016-05-01 NOTE — Telephone Encounter (Signed)
[facility] called regarding the patient self cathing. She would like a call back to discuss this. Her number is [phone]  Thanks, Sharyn Lull

## 2016-05-01 NOTE — Telephone Encounter (Signed)
Spoke to [place] at the pharmacy. He said her [employer] is covering the [place]. Her portion is $297. Thinks she may have a deductible. Suggested she call the insurance to see why it is so much and if they recommend an alternative medication. Left a detailed message for [place].

## 2016-05-02 ENCOUNTER — Telehealth: Payer: Self-pay

## 2016-05-02 DIAGNOSIS — S72001D Fracture of unspecified part of neck of right femur, subsequent encounter for closed fracture with routine healing: Secondary | ICD-10-CM | POA: Diagnosis not present

## 2016-05-02 DIAGNOSIS — K269 Duodenal ulcer, unspecified as acute or chronic, without hemorrhage or perforation: Secondary | ICD-10-CM | POA: Diagnosis not present

## 2016-05-02 DIAGNOSIS — N1330 Unspecified hydronephrosis: Secondary | ICD-10-CM | POA: Diagnosis not present

## 2016-05-02 DIAGNOSIS — N320 Bladder-neck obstruction: Secondary | ICD-10-CM | POA: Diagnosis not present

## 2016-05-02 DIAGNOSIS — J449 Chronic obstructive pulmonary disease, unspecified: Secondary | ICD-10-CM

## 2016-05-02 DIAGNOSIS — D649 Anemia, unspecified: Secondary | ICD-10-CM | POA: Diagnosis not present

## 2016-05-02 NOTE — Telephone Encounter (Signed)
No answer. No vm setup.

## 2016-05-02 NOTE — Telephone Encounter (Signed)
Please order duoneb generic--- tid regularly. Check with pharmacist about budesonide 0.'5mg'$  for inhalation If that is inexpensive--she should take that once a day with the duoneb

## 2016-05-02 NOTE — Telephone Encounter (Signed)
Melissa Watkins with [facility] left v/m; pt needs med for COPD; only has rescue inhaler. Breo cost is $297.00 and request substitute med for [employer]. Pt does not have nebulizer. pts BP 158/100 today; BP remaining high. Pt taking BP med as instructed. Has few crackles to rt base. Did not hear crackles on 04/30/16. Melissa Watkins request cb.

## 2016-05-02 NOTE — Telephone Encounter (Signed)
Spoke to [employer] at [facility]. He said without a rx, he cannot tell how much the budesonide will be. The cash price is $246.59. The cash price for the [facility] was almost $500. So, it may be cheaper. I tried to build the budesonide rx but a 0.'5mg'$  inhaler does not pull up.

## 2016-05-03 DIAGNOSIS — N1330 Unspecified hydronephrosis: Secondary | ICD-10-CM | POA: Diagnosis not present

## 2016-05-03 DIAGNOSIS — S72001D Fracture of unspecified part of neck of right femur, subsequent encounter for closed fracture with routine healing: Secondary | ICD-10-CM | POA: Diagnosis not present

## 2016-05-03 DIAGNOSIS — N320 Bladder-neck obstruction: Secondary | ICD-10-CM | POA: Diagnosis not present

## 2016-05-03 DIAGNOSIS — K269 Duodenal ulcer, unspecified as acute or chronic, without hemorrhage or perforation: Secondary | ICD-10-CM | POA: Diagnosis not present

## 2016-05-03 DIAGNOSIS — D649 Anemia, unspecified: Secondary | ICD-10-CM | POA: Diagnosis not present

## 2016-05-03 DIAGNOSIS — J449 Chronic obstructive pulmonary disease, unspecified: Secondary | ICD-10-CM | POA: Diagnosis not present

## 2016-05-03 MED ORDER — BUDESONIDE 0.5 MG/2ML IN SUSP: 0.5000 mg | Freq: Every day | RESPIRATORY_TRACT | 12 refills | Status: DC

## 2016-05-03 MED ORDER — IPRATROPIUM-ALBUTEROL 0.5-2.5 (3) MG/3ML IN SOLN: 3.0000 mL | Freq: Three times a day (TID) | RESPIRATORY_TRACT | 5 refills | Status: DC

## 2016-05-03 NOTE — Telephone Encounter (Signed)
I sent the prescriptions. See what the budesonide turns out to be but I think at least the duoneb should be inexpensive and she can start that.

## 2016-05-03 NOTE — Telephone Encounter (Signed)
Left a detailed message for [place] on VM

## 2016-05-03 NOTE — Telephone Encounter (Signed)
That is a good idea

## 2016-05-03 NOTE — Telephone Encounter (Signed)
Left detailed message for Melissa Watkins

## 2016-05-03 NOTE — Telephone Encounter (Signed)
Ramona has handled this situation.

## 2016-05-03 NOTE — Telephone Encounter (Signed)
Izora Gala with [facility] contacted office requesting orders for pt to have medical social worker eval to help review meds. Izora Gala can be reached at [phone]

## 2016-05-04 DIAGNOSIS — D649 Anemia, unspecified: Secondary | ICD-10-CM | POA: Diagnosis not present

## 2016-05-04 DIAGNOSIS — N320 Bladder-neck obstruction: Secondary | ICD-10-CM | POA: Diagnosis not present

## 2016-05-04 DIAGNOSIS — N1330 Unspecified hydronephrosis: Secondary | ICD-10-CM | POA: Diagnosis not present

## 2016-05-04 DIAGNOSIS — S72001D Fracture of unspecified part of neck of right femur, subsequent encounter for closed fracture with routine healing: Secondary | ICD-10-CM | POA: Diagnosis not present

## 2016-05-04 DIAGNOSIS — J449 Chronic obstructive pulmonary disease, unspecified: Secondary | ICD-10-CM | POA: Diagnosis not present

## 2016-05-04 DIAGNOSIS — K269 Duodenal ulcer, unspecified as acute or chronic, without hemorrhage or perforation: Secondary | ICD-10-CM | POA: Diagnosis not present

## 2016-05-04 MED ORDER — FLUTICASONE PROPIONATE HFA 220 MCG/ACT IN AERO
1.0000 | INHALATION_SPRAY | Freq: Two times a day (BID) | RESPIRATORY_TRACT | 12 refills | Status: DC
Start: 2016-05-04 — End: 2016-05-10

## 2016-05-04 NOTE — Telephone Encounter (Signed)
Spoke to [place]. I faxed the Neb machine order to [facility].

## 2016-05-04 NOTE — Telephone Encounter (Signed)
Okay Send Rx for flovent 220 --- 1puff bid (needs to use spacer and rinse after) She should still use the duoneb tid  Rx written for nebulizer Cancel the budesonide

## 2016-05-04 NOTE — Addendum Note (Signed)
Addended by: Pilar Grammes on: 05/04/2016 02:42 PM   Modules accepted: Orders

## 2016-05-04 NOTE — Telephone Encounter (Addendum)
Rip Harbour called and said the insurance will cover Flovent as a tier 3 for $30.  She does not have a nebulizer machine. Rx would need to go to [facility] for that if she needs it with Flovent. GI changed her to omeprazole '40mg'$  daily instead of pantoprazole. Will you approve the change and send in a refill? BP was still elevated. 180/100 and 160/100. Wonders if she needs a diuretic. She is compliant with her meds based on pill count.

## 2016-05-05 NOTE — Addendum Note (Signed)
Addended by: Pilar Grammes on: 05/05/2016 10:49 AM   Modules accepted: Orders

## 2016-05-09 ENCOUNTER — Telehealth: Payer: Self-pay | Admitting: *Deleted

## 2016-05-09 DIAGNOSIS — J449 Chronic obstructive pulmonary disease, unspecified: Secondary | ICD-10-CM | POA: Diagnosis not present

## 2016-05-09 DIAGNOSIS — N320 Bladder-neck obstruction: Secondary | ICD-10-CM | POA: Diagnosis not present

## 2016-05-09 DIAGNOSIS — K269 Duodenal ulcer, unspecified as acute or chronic, without hemorrhage or perforation: Secondary | ICD-10-CM | POA: Diagnosis not present

## 2016-05-09 DIAGNOSIS — S72001D Fracture of unspecified part of neck of right femur, subsequent encounter for closed fracture with routine healing: Secondary | ICD-10-CM | POA: Diagnosis not present

## 2016-05-09 DIAGNOSIS — D649 Anemia, unspecified: Secondary | ICD-10-CM | POA: Diagnosis not present

## 2016-05-09 DIAGNOSIS — N1330 Unspecified hydronephrosis: Secondary | ICD-10-CM | POA: Diagnosis not present

## 2016-05-09 NOTE — Telephone Encounter (Signed)
That is okay.

## 2016-05-09 NOTE — Telephone Encounter (Signed)
Merry Proud with [facility] is requesting verbal orders to continue PT, starting 4/16 2x/wk for 2wks, and then once a week for 1 week, for a total of 5 visits. pls advise

## 2016-05-09 NOTE — Telephone Encounter (Signed)
Spoke to [place]. Duoneb is too expensive at $300, also. They think she has a pharmaceutical deductible that makes everything so expensive. She does not have to money to pay these large amounts at one time. But, the ipratropium by itself was on the $4 plan at [facility]

## 2016-05-09 NOTE — Telephone Encounter (Signed)
Melissa Watkins with [facility] contacted office and said after the insurance company ran flovent, it is $300+. They are attempting to get patient assistance for her, but the social worker is off today. Rip Harbour has checked and ipratropium 0.02% is on [facility]'s $4 list and is requesting a new Rx be sent. pls advise

## 2016-05-09 NOTE — Telephone Encounter (Signed)
Find out if she is getting the duoneb--that already has the ipratropium in it. If she is taking the duoneb nebs tid ---we can see if that helps before trying any inhaled steroids

## 2016-05-09 NOTE — Telephone Encounter (Signed)
Gave verbal orders to [place].

## 2016-05-10 DIAGNOSIS — K269 Duodenal ulcer, unspecified as acute or chronic, without hemorrhage or perforation: Secondary | ICD-10-CM | POA: Diagnosis not present

## 2016-05-10 DIAGNOSIS — J449 Chronic obstructive pulmonary disease, unspecified: Secondary | ICD-10-CM | POA: Diagnosis not present

## 2016-05-10 DIAGNOSIS — D649 Anemia, unspecified: Secondary | ICD-10-CM | POA: Diagnosis not present

## 2016-05-10 DIAGNOSIS — S72001D Fracture of unspecified part of neck of right femur, subsequent encounter for closed fracture with routine healing: Secondary | ICD-10-CM | POA: Diagnosis not present

## 2016-05-10 DIAGNOSIS — N320 Bladder-neck obstruction: Secondary | ICD-10-CM | POA: Diagnosis not present

## 2016-05-10 DIAGNOSIS — N1330 Unspecified hydronephrosis: Secondary | ICD-10-CM | POA: Diagnosis not present

## 2016-05-10 MED ORDER — IPRATROPIUM BROMIDE 0.02 % IN SOLN: 0.5000 mg | Freq: Three times a day (TID) | RESPIRATORY_TRACT | 12 refills | Status: DC

## 2016-05-10 NOTE — Telephone Encounter (Signed)
It it the nebulizer solution. I sent in the rx. Spoke to [place].

## 2016-05-10 NOTE — Telephone Encounter (Signed)
Okay---send the ipratropium to take tid regularly (is that neb or inhaler?)

## 2016-05-10 NOTE — Addendum Note (Signed)
Addended by: Pilar Grammes on: 05/10/2016 08:48 AM   Modules accepted: Orders

## 2016-05-12 ENCOUNTER — Telehealth: Payer: Self-pay

## 2016-05-12 DIAGNOSIS — K269 Duodenal ulcer, unspecified as acute or chronic, without hemorrhage or perforation: Secondary | ICD-10-CM | POA: Diagnosis not present

## 2016-05-12 DIAGNOSIS — J449 Chronic obstructive pulmonary disease, unspecified: Secondary | ICD-10-CM | POA: Diagnosis not present

## 2016-05-12 DIAGNOSIS — D649 Anemia, unspecified: Secondary | ICD-10-CM | POA: Diagnosis not present

## 2016-05-12 DIAGNOSIS — S72001D Fracture of unspecified part of neck of right femur, subsequent encounter for closed fracture with routine healing: Secondary | ICD-10-CM | POA: Diagnosis not present

## 2016-05-12 DIAGNOSIS — N320 Bladder-neck obstruction: Secondary | ICD-10-CM | POA: Diagnosis not present

## 2016-05-12 DIAGNOSIS — N1330 Unspecified hydronephrosis: Secondary | ICD-10-CM | POA: Diagnosis not present

## 2016-05-12 NOTE — Telephone Encounter (Signed)
Left voicemail giving Izora Gala the verbal order and advise her of Dr. Marliss Coots comments and let her know Dr. Silvio Pate will advise her what to do for pt's BP issues on Monday

## 2016-05-12 NOTE — Telephone Encounter (Signed)
Melissa Watkins OT with [facility] left v/m; when Melissa Watkins got to pts home pts BP was 140/90 and after 10 mins of standing and minor activity BP was 166/100. Melissa Watkins had pt do some sitting for about 6 mins and BP was 152/92. Pt said she is taking metoprolol bid.  Melissa Watkins wanted PCP to be aware. Melissa Watkins also wants to wants to request extension of HH OT 2 x a week for 2 weeks for shower transfers and bathing. Melissa Watkins request cb.

## 2016-05-12 NOTE — Telephone Encounter (Signed)
Please give the verbal ok of HH extension  Keep watching bp please and alert [place] if she has any symptoms  I will cc to PCP who will be back Monday to determine follow up  Will cc [place] as well

## 2016-05-13 DIAGNOSIS — K269 Duodenal ulcer, unspecified as acute or chronic, without hemorrhage or perforation: Secondary | ICD-10-CM | POA: Diagnosis not present

## 2016-05-13 DIAGNOSIS — N320 Bladder-neck obstruction: Secondary | ICD-10-CM | POA: Diagnosis not present

## 2016-05-13 DIAGNOSIS — J449 Chronic obstructive pulmonary disease, unspecified: Secondary | ICD-10-CM | POA: Diagnosis not present

## 2016-05-13 DIAGNOSIS — D649 Anemia, unspecified: Secondary | ICD-10-CM | POA: Diagnosis not present

## 2016-05-13 DIAGNOSIS — S72001D Fracture of unspecified part of neck of right femur, subsequent encounter for closed fracture with routine healing: Secondary | ICD-10-CM | POA: Diagnosis not present

## 2016-05-13 DIAGNOSIS — N1330 Unspecified hydronephrosis: Secondary | ICD-10-CM | POA: Diagnosis not present

## 2016-05-13 NOTE — Telephone Encounter (Signed)
I am not particularly concerned about those BPs. She gets very anxious with any medical interventions

## 2016-05-14 NOTE — Progress Notes (Signed)
Marland Kitchen   05/15/2016 10:58 AM   Melissa Watkins 1945/09/03 [id number]  Referring provider: Venia Carbon, MD [address] [place], [place] [address]  Chief Complaint  Patient presents with  . Urinary Retention    1 month follow up      HPI: 71 yo WF who presents today for a one month follow up after failing CIC for management of her urinary retention.  Background history F/u hospital consult urinary retention. Seen Feb 22, 2016 two weeks after out from RIGHT HIP HEMIARTHROPLASTY. She was readmitted from SNF. Cr rose from 0.86 to 1.2 and u/s showed bilateral hydro and > 999 ml. A foley was placed. Cr dropped to 0.98.  Today, she's in a wheelchair. She has back pain, but it sounds like it's from sitting in the wheelchair. Also, c/o stomach pain. She's getting to a bedside commode but still slow to ambulate.   She's a retired Scientific laboratory technician.  One week ago the Foley was removed for a voiding trial.  Since the catheter has been removed, she has been voiding.  She is not having suprapubic pain at this time.  She is not experiencing dysuria or gross hematuria.  She is not having fevers, chills, nausea or vomiting.  Her PVR was 488 mL.    At her visit one month ago, she was instructed on CIC.  After a few days, the patient did not desire to continue CIC and requested having an indwelling Foley replaced.  A Foley was not placed at the facility.  She states she has not had any difficultly urinating at this time.    Today, she feels she is emptying her bladder.  She is not experiencing dysuria, gross hematuria or suprapubic pain.  She is not having fevers, chills, nausea or vomiting.   Her PVR was 37 mL.     PMH: Past Medical History:  Diagnosis Date  . Back pain, chronic   . COPD (chronic obstructive pulmonary disease) ([place])   . Duodenal ulcer 01/2016   and esophagitis (hospitalized)  . Essential hypertension, benign   . Essential hypertension, benign   . Osteoarthritis   .  Osteoporosis     Surgical History: Past Surgical History:  Procedure Laterality Date  . ABDOMINAL HYSTERECTOMY    . CHOLECYSTECTOMY    . COLONOSCOPY    . ESOPHAGOGASTRODUODENOSCOPY (EGD) WITH PROPOFOL N/A 02/24/2016   Procedure: ESOPHAGOGASTRODUODENOSCOPY (EGD) WITH PROPOFOL;  Surgeon: Jonathon Bellows, MD;  Location: [facility] ENDOSCOPY;  Service: Endoscopy;  Laterality: N/A;  . HIP ARTHROPLASTY Right 02/07/2016   Procedure: ARTHROPLASTY BIPOLAR HIP (HEMIARTHROPLASTY);  Surgeon: Thornton Park, MD;  Location: [facility] ORS;  Service: Orthopedics;  Laterality: Right;  . HIP FRACTURE SURGERY Right 01/2016  . OOPHORECTOMY      Home Medications:  Allergies as of 05/15/2016      Reactions   Oxycodone-acetaminophen Other (See Comments)   Hallucinations   Valium [diazepam]       Medication List       Accurate as of 05/15/16 10:58 AM. Always use your most recent med list.          acetaminophen 650 MG CR tablet Commonly known as:  TYLENOL Take 650 mg by mouth 3 (three) times daily.   Acidophilus Lactobacillus Caps Take 1 capsule by mouth daily. For 10 days   albuterol 108 (90 Base) MCG/ACT inhaler Commonly known as:  PROVENTIL HFA;VENTOLIN HFA Inhale 2 puffs into the lungs every 6 (six) hours as needed for wheezing or shortness of  breath.   feeding supplement (ENSURE ENLIVE) Liqd Take 237 mLs by mouth 2 (two) times daily between meals.   HYDROcodone-acetaminophen 7.5-325 MG tablet Commonly known as:  NORCO Take 1 tablet by mouth every 4 (four) hours as needed for moderate pain.   ipratropium 0.02 % nebulizer solution Commonly known as:  ATROVENT Take 2.5 mLs (0.5 mg total) by nebulization 3 (three) times daily.   ipratropium-albuterol 0.5-2.5 (3) MG/3ML Soln Commonly known as:  DUONEB Take 3 mLs by nebulization 3 (three) times daily.   metoprolol tartrate 25 MG tablet Commonly known as:  LOPRESSOR Take 1 tablet (25 mg total) by mouth 2 (two) times daily.   pantoprazole 40 MG  tablet Commonly known as:  PROTONIX Take 1 tablet (40 mg total) by mouth daily. Switch for any other PPI at similar dose and frequency   senna 8.6 MG Tabs tablet Commonly known as:  SENOKOT Take 1 tablet by mouth.       Allergies:  Allergies  Allergen Reactions  . Oxycodone-Acetaminophen Other (See Comments)    Hallucinations  . Valium [Diazepam]     Family History: Family History  Problem Relation Age of Onset  . Cancer Mother   . COPD Father   . Asthma Son   . Prostate cancer Neg Hx   . Kidney cancer Neg Hx   . Bladder Cancer Neg Hx     Social History:  reports that she has been smoking Cigarettes.  She has a 50.00 pack-year smoking history. She has never used smokeless tobacco. She reports that she does not drink alcohol or use drugs.  ROS: UROLOGY Frequent Urination?: No Hard to postpone urination?: No Burning/pain with urination?: No Get up at night to urinate?: No Leakage of urine?: No Urine stream starts and stops?: No Trouble starting stream?: No Do you have to strain to urinate?: No Blood in urine?: No Urinary tract infection?: No Sexually transmitted disease?: No Injury to kidneys or bladder?: No Painful intercourse?: No Weak stream?: No Currently pregnant?: No Vaginal bleeding?: No Last menstrual period?: n  Gastrointestinal Nausea?: No Vomiting?: No Indigestion/heartburn?: No Diarrhea?: No Constipation?: No  Constitutional Fever: No Night sweats?: No Weight loss?: No Fatigue?: No  Skin Skin rash/lesions?: No Itching?: No  Eyes Blurred vision?: No Double vision?: No  Ears/Nose/Throat Sore throat?: No Sinus problems?: No  Hematologic/Lymphatic Swollen glands?: No Easy bruising?: No  Cardiovascular Leg swelling?: No Chest pain?: No  Respiratory Cough?: Yes Shortness of breath?: Yes  Endocrine Excessive thirst?: No  Musculoskeletal Back pain?: Yes Joint pain?: No  Neurological Headaches?: No Dizziness?:  No  Psychologic Depression?: No Anxiety?: No  Physical Exam: BP (!) 183/76   Pulse 69   Ht '4\' 11"'$  (1.499 m)   Wt 99 lb 9.6 oz (45.2 kg)   BMI 20.12 kg/m   Constitutional:  Alert and oriented, No acute distress. HEENT: [place] AT, moist mucus membranes.  Trachea midline, no masses. Cardiovascular: No clubbing, cyanosis, or edema. Respiratory: Normal respiratory effort, no increased work of breathing. GI: Abdomen is soft, nontender, nondistended, no abdominal masses GU: No CVA tenderness. Urine clear.  Skin: No rashes, bruises or suspicious lesions. Lymph: No adenopathy. Neurologic: Grossly intact, no focal deficits, moving all 4 extremities. Psychiatric: Normal mood and affect.  Laboratory Data: Lab Results  Component Value Date   WBC 12.1 (H) 02/24/2016   HGB 8.8 (L) 02/24/2016   HCT 26.0 (L) 02/24/2016   MCV 88.0 02/24/2016   PLT 415 02/24/2016    Lab Results  Component Value Date   CREATININE 0.98 02/23/2016   Pertinent Imaging Results for JOCLYNN, LUMB (MRN [id number]) as of 05/15/2016 10:56  Ref. Range 05/15/2016 10:49  Scan Result Unknown 37    Assessment & Plan:    1. History of urinary retention  - PVR is 37 cc  - RTC in one month for OAB questionnaire and PVR   Return in about 1 month (around 06/14/2016) for PVR and OAB questionnaire.  Zara Council, [facility] [address], [place],  [address] [phone]

## 2016-05-15 ENCOUNTER — Ambulatory Visit (INDEPENDENT_AMBULATORY_CARE_PROVIDER_SITE_OTHER): Payer: Medicare Other | Admitting: Urology

## 2016-05-15 ENCOUNTER — Encounter: Payer: Self-pay | Admitting: Urology

## 2016-05-15 VITALS — BP 183/76 | HR 69 | Ht 59.0 in | Wt 99.6 lb

## 2016-05-15 DIAGNOSIS — R339 Retention of urine, unspecified: Secondary | ICD-10-CM

## 2016-05-15 DIAGNOSIS — Z87898 Personal history of other specified conditions: Secondary | ICD-10-CM | POA: Diagnosis not present

## 2016-05-15 LAB — BLADDER SCAN AMB NON-IMAGING: SCAN RESULT: 37

## 2016-05-15 NOTE — Telephone Encounter (Signed)
Spoke to [place] and provided verbal orders. Also advised per Dr. Silvio Pate regarding BP readings.

## 2016-05-16 ENCOUNTER — Other Ambulatory Visit: Payer: Self-pay

## 2016-05-16 ENCOUNTER — Telehealth: Payer: Self-pay | Admitting: Gastroenterology

## 2016-05-16 DIAGNOSIS — J449 Chronic obstructive pulmonary disease, unspecified: Secondary | ICD-10-CM | POA: Diagnosis not present

## 2016-05-16 DIAGNOSIS — N1330 Unspecified hydronephrosis: Secondary | ICD-10-CM | POA: Diagnosis not present

## 2016-05-16 DIAGNOSIS — D649 Anemia, unspecified: Secondary | ICD-10-CM | POA: Diagnosis not present

## 2016-05-16 DIAGNOSIS — S72001D Fracture of unspecified part of neck of right femur, subsequent encounter for closed fracture with routine healing: Secondary | ICD-10-CM | POA: Diagnosis not present

## 2016-05-16 DIAGNOSIS — N320 Bladder-neck obstruction: Secondary | ICD-10-CM | POA: Diagnosis not present

## 2016-05-16 DIAGNOSIS — K269 Duodenal ulcer, unspecified as acute or chronic, without hemorrhage or perforation: Secondary | ICD-10-CM | POA: Diagnosis not present

## 2016-05-16 NOTE — Telephone Encounter (Signed)
Melissa Watkins with [facility] left v/m requesting refill metoprolol tartrate 25 mg bid to [facility] [address] Dr Silvio Pate has not prescribed before; pt got med while in rehab. Please advise.

## 2016-05-16 NOTE — Telephone Encounter (Signed)
Please call Rip Harbour from [place] [phone] regarding this patient

## 2016-05-17 MED ORDER — METOPROLOL TARTRATE 25 MG PO TABS: 25.0000 mg | ORAL_TABLET | Freq: Two times a day (BID) | ORAL | 3 refills | Status: DC

## 2016-05-17 NOTE — Telephone Encounter (Signed)
Approved: okay for 1 year

## 2016-05-18 DIAGNOSIS — N1330 Unspecified hydronephrosis: Secondary | ICD-10-CM | POA: Diagnosis not present

## 2016-05-18 DIAGNOSIS — K269 Duodenal ulcer, unspecified as acute or chronic, without hemorrhage or perforation: Secondary | ICD-10-CM | POA: Diagnosis not present

## 2016-05-18 DIAGNOSIS — D649 Anemia, unspecified: Secondary | ICD-10-CM | POA: Diagnosis not present

## 2016-05-18 DIAGNOSIS — N320 Bladder-neck obstruction: Secondary | ICD-10-CM | POA: Diagnosis not present

## 2016-05-18 DIAGNOSIS — J449 Chronic obstructive pulmonary disease, unspecified: Secondary | ICD-10-CM | POA: Diagnosis not present

## 2016-05-18 DIAGNOSIS — S72001D Fracture of unspecified part of neck of right femur, subsequent encounter for closed fracture with routine healing: Secondary | ICD-10-CM | POA: Diagnosis not present

## 2016-05-23 ENCOUNTER — Telehealth: Payer: Self-pay | Admitting: Gastroenterology

## 2016-05-23 ENCOUNTER — Other Ambulatory Visit: Payer: Self-pay

## 2016-05-23 DIAGNOSIS — N1330 Unspecified hydronephrosis: Secondary | ICD-10-CM | POA: Diagnosis not present

## 2016-05-23 DIAGNOSIS — S72001D Fracture of unspecified part of neck of right femur, subsequent encounter for closed fracture with routine healing: Secondary | ICD-10-CM | POA: Diagnosis not present

## 2016-05-23 DIAGNOSIS — R000 Tachycardia, unspecified: Secondary | ICD-10-CM | POA: Insufficient documentation

## 2016-05-23 DIAGNOSIS — D649 Anemia, unspecified: Secondary | ICD-10-CM | POA: Diagnosis not present

## 2016-05-23 DIAGNOSIS — R2990 Unspecified symptoms and signs involving the nervous system: Secondary | ICD-10-CM | POA: Insufficient documentation

## 2016-05-23 DIAGNOSIS — N320 Bladder-neck obstruction: Secondary | ICD-10-CM | POA: Diagnosis not present

## 2016-05-23 DIAGNOSIS — R0989 Other specified symptoms and signs involving the circulatory and respiratory systems: Secondary | ICD-10-CM | POA: Insufficient documentation

## 2016-05-23 DIAGNOSIS — R5383 Other fatigue: Secondary | ICD-10-CM | POA: Insufficient documentation

## 2016-05-23 DIAGNOSIS — K269 Duodenal ulcer, unspecified as acute or chronic, without hemorrhage or perforation: Secondary | ICD-10-CM | POA: Diagnosis not present

## 2016-05-23 DIAGNOSIS — J449 Chronic obstructive pulmonary disease, unspecified: Secondary | ICD-10-CM | POA: Diagnosis not present

## 2016-05-23 MED ORDER — OMEPRAZOLE 40 MG PO CPDR: 40.0000 mg | DELAYED_RELEASE_CAPSULE | Freq: Every day | ORAL | 1 refills | Status: DC

## 2016-05-23 NOTE — Telephone Encounter (Signed)
05/23/16 Patient has MCR and NO prior auth required for EGD [address].

## 2016-05-24 ENCOUNTER — Ambulatory Visit (INDEPENDENT_AMBULATORY_CARE_PROVIDER_SITE_OTHER): Payer: Medicare Other | Admitting: Internal Medicine

## 2016-05-24 ENCOUNTER — Encounter: Payer: Self-pay | Admitting: Internal Medicine

## 2016-05-24 VITALS — BP 130/88 | HR 101 | Temp 98.4°F | Wt 99.4 lb

## 2016-05-24 DIAGNOSIS — I10 Essential (primary) hypertension: Secondary | ICD-10-CM

## 2016-05-24 DIAGNOSIS — N320 Bladder-neck obstruction: Secondary | ICD-10-CM

## 2016-05-24 DIAGNOSIS — E441 Mild protein-calorie malnutrition: Secondary | ICD-10-CM | POA: Diagnosis not present

## 2016-05-24 DIAGNOSIS — J449 Chronic obstructive pulmonary disease, unspecified: Secondary | ICD-10-CM

## 2016-05-24 MED ORDER — AMLODIPINE BESYLATE 2.5 MG PO TABS
2.5000 mg | ORAL_TABLET | Freq: Every day | ORAL | 11 refills | Status: DC
Start: 2016-05-24 — End: 2017-11-23

## 2016-05-24 NOTE — Assessment & Plan Note (Signed)
BP up to 488 systolic after talking to me Will add low dose amlodipine

## 2016-05-24 NOTE — Assessment & Plan Note (Signed)
This is better now Voiding on her own

## 2016-05-24 NOTE — Assessment & Plan Note (Signed)
Stable now Trying to get medication approved with insurance and that they can afford

## 2016-05-24 NOTE — Progress Notes (Signed)
Pre visit review using our clinic review tool, if applicable. No additional management support is needed unless otherwise documented below in the visit note. 

## 2016-05-24 NOTE — Assessment & Plan Note (Signed)
Eating better now per son Will monitor

## 2016-05-24 NOTE — Progress Notes (Signed)
Subjective:    Patient ID: Melissa Watkins, female    DOB: [birth year], 71 y.o.   MRN: [id number]  HPI Here with son for follow up of multiple medical problems BP up with therapy frequently  No chest pain No palpitations No dizziness No headache  Has improved with the therapy Walking increased distance with walker now Uses bathroom herself Needs standby assistance for showers Dresses herself Son still does the housework, etc  Haven't been able to get any of the breathing meds--due to coverage Breathing is okay she thinks Occasional dry cough  No problems with voiding Catheter is out for 2 months   Heartburn is controlled No dysphagia  Current Outpatient Prescriptions on File Prior to Visit  Medication Sig Dispense Refill  . acetaminophen (TYLENOL) 650 MG CR tablet Take 650 mg by mouth 3 (three) times daily.    Marland Kitchen albuterol (PROVENTIL HFA;VENTOLIN HFA) 108 (90 Base) MCG/ACT inhaler Inhale 2 puffs into the lungs every 6 (six) hours as needed for wheezing or shortness of breath. 1 Inhaler 3  . metoprolol tartrate (LOPRESSOR) 25 MG tablet Take 1 tablet (25 mg total) by mouth 2 (two) times daily. 180 tablet 3  . omeprazole (PRILOSEC) 40 MG capsule Take 1 capsule (40 mg total) by mouth daily. 90 capsule 1   No current facility-administered medications on file prior to visit.     Allergies  Allergen Reactions  . Oxycodone-Acetaminophen Other (See Comments)    Hallucinations  . Valium [Diazepam]     Past Medical History:  Diagnosis Date  . Back pain, chronic   . COPD (chronic obstructive pulmonary disease) ([place])   . Duodenal ulcer 01/2016   and esophagitis (hospitalized)  . Essential hypertension, benign   . Essential hypertension, benign   . Osteoarthritis   . Osteoporosis     Past Surgical History:  Procedure Laterality Date  . ABDOMINAL HYSTERECTOMY    . CHOLECYSTECTOMY    . COLONOSCOPY    . ESOPHAGOGASTRODUODENOSCOPY (EGD) WITH PROPOFOL N/A 02/24/2016   Procedure: ESOPHAGOGASTRODUODENOSCOPY (EGD) WITH PROPOFOL;  Surgeon: Jonathon Bellows, MD;  Location: [facility] ENDOSCOPY;  Service: Endoscopy;  Laterality: N/A;  . HIP ARTHROPLASTY Right 02/07/2016   Procedure: ARTHROPLASTY BIPOLAR HIP (HEMIARTHROPLASTY);  Surgeon: Thornton Park, MD;  Location: [facility] ORS;  Service: Orthopedics;  Laterality: Right;  . HIP FRACTURE SURGERY Right 01/2016  . OOPHORECTOMY      Family History  Problem Relation Age of Onset  . Cancer Mother   . COPD Father   . Asthma Son   . Prostate cancer Neg Hx   . Kidney cancer Neg Hx   . Bladder Cancer Neg Hx     Social History   Social History  . Marital status: Widowed    Spouse name: N/A  . Number of children: 4  . Years of education: N/A   Occupational History  . Nurse (travel and hemodialysis)     Retired   Social History Main Topics  . Smoking status: Former Smoker    Packs/day: 1.00    Years: 50.00    Types: Cigarettes    Quit date: 05/17/2016  . Smokeless tobacco: Never Used     Comment: for 30 days  . Alcohol use No     Comment: occasional  . Drug use: No  . Sexual activity: Not on file   Other Topics Concern  . Not on file   Social History Narrative   No living will   4 children (2 of each). Son lives with  her   DNR done 02/25/16 ("if it is time to go, it is time to go")   Review of Systems Appetite is fine Has lost a lot of weight--- now eating better now though Sleeps okay Some neck pain    Objective:   Physical Exam  Constitutional: No distress.  Neck: No thyromegaly present.  Cardiovascular: Normal rate, regular rhythm and normal heart sounds.  Exam reveals no gallop.   No murmur heard. Pulmonary/Chest: No respiratory distress. She has no wheezes. She has no rales.  Decreased breath sounds but clear  Musculoskeletal: She exhibits no edema.  Lymphadenopathy:    She has no cervical adenopathy.  Psychiatric: She has a normal mood and affect. Her behavior is normal.            Assessment & Plan:

## 2016-05-25 DIAGNOSIS — N320 Bladder-neck obstruction: Secondary | ICD-10-CM | POA: Diagnosis not present

## 2016-05-25 DIAGNOSIS — N1330 Unspecified hydronephrosis: Secondary | ICD-10-CM | POA: Diagnosis not present

## 2016-05-25 DIAGNOSIS — J449 Chronic obstructive pulmonary disease, unspecified: Secondary | ICD-10-CM | POA: Diagnosis not present

## 2016-05-25 DIAGNOSIS — D649 Anemia, unspecified: Secondary | ICD-10-CM | POA: Diagnosis not present

## 2016-05-25 DIAGNOSIS — S72001D Fracture of unspecified part of neck of right femur, subsequent encounter for closed fracture with routine healing: Secondary | ICD-10-CM | POA: Diagnosis not present

## 2016-05-25 DIAGNOSIS — K269 Duodenal ulcer, unspecified as acute or chronic, without hemorrhage or perforation: Secondary | ICD-10-CM | POA: Diagnosis not present

## 2016-05-31 DIAGNOSIS — S72001D Fracture of unspecified part of neck of right femur, subsequent encounter for closed fracture with routine healing: Secondary | ICD-10-CM | POA: Diagnosis not present

## 2016-05-31 DIAGNOSIS — K269 Duodenal ulcer, unspecified as acute or chronic, without hemorrhage or perforation: Secondary | ICD-10-CM | POA: Diagnosis not present

## 2016-05-31 DIAGNOSIS — N320 Bladder-neck obstruction: Secondary | ICD-10-CM | POA: Diagnosis not present

## 2016-05-31 DIAGNOSIS — J449 Chronic obstructive pulmonary disease, unspecified: Secondary | ICD-10-CM | POA: Diagnosis not present

## 2016-05-31 DIAGNOSIS — D649 Anemia, unspecified: Secondary | ICD-10-CM | POA: Diagnosis not present

## 2016-05-31 DIAGNOSIS — N1330 Unspecified hydronephrosis: Secondary | ICD-10-CM | POA: Diagnosis not present

## 2016-06-08 ENCOUNTER — Ambulatory Visit
Admission: RE | Admit: 2016-06-08 | Discharge: 2016-06-08 | Disposition: A | Discharge status: Home / Self Care | Payer: Medicare Other | Source: Ambulatory Visit | Attending: Gastroenterology | Admitting: Gastroenterology

## 2016-06-08 ENCOUNTER — Encounter: Payer: Self-pay | Admitting: *Deleted

## 2016-06-08 ENCOUNTER — Encounter
Admission: RE | Disposition: A | Discharge status: Home / Self Care | Payer: Self-pay | Source: Ambulatory Visit | Attending: Gastroenterology

## 2016-06-08 ENCOUNTER — Ambulatory Visit: Payer: Medicare Other | Admitting: Anesthesiology

## 2016-06-08 DIAGNOSIS — N289 Disorder of kidney and ureter, unspecified: Secondary | ICD-10-CM | POA: Diagnosis not present

## 2016-06-08 DIAGNOSIS — F17210 Nicotine dependence, cigarettes, uncomplicated: Secondary | ICD-10-CM | POA: Insufficient documentation

## 2016-06-08 DIAGNOSIS — K264 Chronic or unspecified duodenal ulcer with hemorrhage: Secondary | ICD-10-CM | POA: Diagnosis not present

## 2016-06-08 DIAGNOSIS — G8929 Other chronic pain: Secondary | ICD-10-CM | POA: Insufficient documentation

## 2016-06-08 DIAGNOSIS — D649 Anemia, unspecified: Secondary | ICD-10-CM | POA: Diagnosis not present

## 2016-06-08 DIAGNOSIS — Z66 Do not resuscitate: Secondary | ICD-10-CM | POA: Insufficient documentation

## 2016-06-08 DIAGNOSIS — I10 Essential (primary) hypertension: Secondary | ICD-10-CM | POA: Diagnosis not present

## 2016-06-08 DIAGNOSIS — J449 Chronic obstructive pulmonary disease, unspecified: Secondary | ICD-10-CM | POA: Insufficient documentation

## 2016-06-08 DIAGNOSIS — K449 Diaphragmatic hernia without obstruction or gangrene: Secondary | ICD-10-CM | POA: Diagnosis not present

## 2016-06-08 DIAGNOSIS — K269 Duodenal ulcer, unspecified as acute or chronic, without hemorrhage or perforation: Secondary | ICD-10-CM

## 2016-06-08 DIAGNOSIS — K5710 Diverticulosis of small intestine without perforation or abscess without bleeding: Secondary | ICD-10-CM | POA: Diagnosis not present

## 2016-06-08 DIAGNOSIS — Z79899 Other long term (current) drug therapy: Secondary | ICD-10-CM | POA: Diagnosis not present

## 2016-06-08 DIAGNOSIS — M1990 Unspecified osteoarthritis, unspecified site: Secondary | ICD-10-CM | POA: Insufficient documentation

## 2016-06-08 DIAGNOSIS — M810 Age-related osteoporosis without current pathological fracture: Secondary | ICD-10-CM | POA: Diagnosis not present

## 2016-06-08 HISTORY — PX: ESOPHAGOGASTRODUODENOSCOPY (EGD) WITH PROPOFOL: SHX5813

## 2016-06-08 SURGERY — ESOPHAGOGASTRODUODENOSCOPY (EGD) WITH PROPOFOL
Anesthesia: General

## 2016-06-08 MED ORDER — SODIUM CHLORIDE 0.9 % IV SOLN
INTRAVENOUS | Status: DC
  Administered 2016-06-08 (×2): via INTRAVENOUS

## 2016-06-08 MED ORDER — PROPOFOL 10 MG/ML IV BOLUS
INTRAVENOUS | Status: DC | PRN
  Administered 2016-06-08: 50 mg via INTRAVENOUS

## 2016-06-08 MED ORDER — PROPOFOL 500 MG/50ML IV EMUL
INTRAVENOUS | Status: DC | PRN
  Administered 2016-06-08: 100 ug/kg/min via INTRAVENOUS

## 2016-06-08 NOTE — Anesthesia Procedure Notes (Signed)
Date/Time: 06/08/2016 9:36 AM Performed by: Nelda Marseille Pre-anesthesia Checklist: Patient identified, Emergency Drugs available, Suction available, Patient being monitored and Timeout performed Oxygen Delivery Method: Nasal cannula

## 2016-06-08 NOTE — Op Note (Signed)
[facility] Gastroenterology Patient Name: Melissa Watkins Procedure Date: 06/08/2016 9:28 AM MRN: [id number] Account #: [id number] Date of Birth: 07/19/1945 Admit Type: Outpatient Age: 71 Room: [facility] ENDO ROOM 4 Gender: Female Note Status: Finalized Procedure:            Upper GI endoscopy Indications:          Follow-up of acute duodenal ulcer with hemorrhage Providers:            Jonathon Bellows MD, MD Referring MD:         No Local Md, MD (Referring MD) Medicines:            Monitored Anesthesia Care Complications:        No immediate complications. Procedure:            Pre-Anesthesia Assessment:                       - Prior to the procedure, a History and Physical was                        performed, and patient medications, allergies and                        sensitivities were reviewed. The patient's tolerance of                        previous anesthesia was reviewed.                       - The risks and benefits of the procedure and the                        sedation options and risks were discussed with the                        patient. All questions were answered and informed                        consent was obtained.                       - ASA Grade Assessment: III - A patient with severe                        systemic disease.                       After obtaining informed consent, the endoscope was                        passed under direct vision. Throughout the procedure,                        the patient's blood pressure, pulse, and oxygen                        saturations were monitored continuously. The Endoscope                        was introduced through the mouth, and advanced to the  third part of duodenum. The upper GI endoscopy was                        accomplished with ease. The patient tolerated the                        procedure well. Findings:      A 5 cm hiatal hernia was present.      The  esophagus was normal.      The examined duodenum was normal.      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 Impression:           - 5 cm hiatal hernia.                       - Normal esophagus.                       - Normal examined duodenum.                       - No specimens collected. Recommendation:       - Discharge patient to home (with escort).                       - Advance diet as tolerated.                       - Continue present medications.                       - Return to my office PRN. Procedure Code(s):    --- Professional ---                       [id number], Esophagogastroduodenoscopy, flexible, transoral;                        diagnostic, including collection of specimen(s) by                        brushing or washing, when performed (separate procedure) Diagnosis Code(s):    --- Professional ---                       K44.9, Diaphragmatic hernia without obstruction or                        gangrene                       K26.0, Acute duodenal ulcer with hemorrhage CPT copyright 2016 American Medical Association. All rights reserved. The codes documented in this report are preliminary and upon coder review may  be revised to meet current compliance requirements. Jonathon Bellows, MD Jonathon Bellows MD, MD 06/08/2016 9:42:26 AM This report has been signed electronically. Number of Addenda: 0 Note Initiated On: 06/08/2016 9:28 AM      [place]

## 2016-06-08 NOTE — Anesthesia Post-op Follow-up Note (Cosign Needed)
Anesthesia QCDR form completed.        

## 2016-06-08 NOTE — Anesthesia Postprocedure Evaluation (Signed)
Anesthesia Post Note  Patient: Melissa Watkins  Procedure(s) Performed: Procedure(s) (LRB): ESOPHAGOGASTRODUODENOSCOPY (EGD) WITH PROPOFOL (N/A)  Patient location during evaluation: Endoscopy Anesthesia Type: General Level of consciousness: awake and alert Pain management: pain level controlled Vital Signs Assessment: post-procedure vital signs reviewed and stable Respiratory status: spontaneous breathing, nonlabored ventilation, respiratory function stable and patient connected to nasal cannula oxygen Cardiovascular status: blood pressure returned to baseline and stable Postop Assessment: no signs of nausea or vomiting Anesthetic complications: no     Last Vitals:  Vitals:   06/08/16 1002 06/08/16 1012  BP: (!) 168/82 (!) 184/78  Pulse: 72 71  Resp: (!) 21 17  Temp:      Last Pain:  Vitals:   06/08/16 0942  TempSrc: Tympanic  PainSc: Asleep                 Martha Clan

## 2016-06-08 NOTE — H&P (Signed)
Jonathon Bellows MD [address] [place], [place] [address] Phone: [phone] Fax : [phone]  Primary Care Physician:  Venia Carbon, MD Primary Gastroenterologist:  Dr. Jonathon Bellows   Pre-Procedure History & Physical: HPI:  Melissa Watkins is a 71 y.o. female is here for an endoscopy.   Past Medical History:  Diagnosis Date  . Back pain, chronic   . COPD (chronic obstructive pulmonary disease) ([place])   . Duodenal ulcer 01/2016   and esophagitis (hospitalized)  . Essential hypertension, benign   . Essential hypertension, benign   . Osteoarthritis   . Osteoporosis     Past Surgical History:  Procedure Laterality Date  . ABDOMINAL HYSTERECTOMY    . CHOLECYSTECTOMY    . COLONOSCOPY    . ESOPHAGOGASTRODUODENOSCOPY (EGD) WITH PROPOFOL N/A 02/24/2016   Procedure: ESOPHAGOGASTRODUODENOSCOPY (EGD) WITH PROPOFOL;  Surgeon: Jonathon Bellows, MD;  Location: [facility] ENDOSCOPY;  Service: Endoscopy;  Laterality: N/A;  . HIP ARTHROPLASTY Right 02/07/2016   Procedure: ARTHROPLASTY BIPOLAR HIP (HEMIARTHROPLASTY);  Surgeon: Thornton Park, MD;  Location: [facility] ORS;  Service: Orthopedics;  Laterality: Right;  . HIP FRACTURE SURGERY Right 01/2016  . OOPHORECTOMY      Prior to Admission medications   Medication Sig Start Date End Date Taking? Authorizing Provider  acetaminophen (TYLENOL) 650 MG CR tablet Take 650 mg by mouth 3 (three) times daily.   Yes [provider]  albuterol (PROVENTIL HFA;VENTOLIN HFA) 108 (90 Base) MCG/ACT inhaler Inhale 2 puffs into the lungs every 6 (six) hours as needed for wheezing or shortness of breath. 04/21/16  Yes Viviana Simpler I, MD  amLODipine (NORVASC) 2.5 MG tablet Take 1 tablet (2.5 mg total) by mouth daily. 05/24/16  Yes Venia Carbon, MD  metoprolol tartrate (LOPRESSOR) 25 MG tablet Take 1 tablet (25 mg total) by mouth 2 (two) times daily. 05/17/16  Yes Venia Carbon, MD  omeprazole (PRILOSEC) 40 MG capsule Take 1 capsule (40 mg total)  by mouth daily. 05/23/16  Yes Lucilla Lame, MD    Allergies as of 05/23/2016 - Review Complete 05/15/2016  Allergen Reaction Noted  . Oxycodone-acetaminophen Other (See Comments) 09/06/2013  . Valium [diazepam]  07/15/2013    Family History  Problem Relation Age of Onset  . Cancer Mother   . COPD Father   . Asthma Son   . Prostate cancer Neg Hx   . Kidney cancer Neg Hx   . Bladder Cancer Neg Hx     Social History   Social History  . Marital status: Widowed    Spouse name: N/A  . Number of children: 4  . Years of education: N/A   Occupational History  . Nurse (travel and hemodialysis)     Retired   Social History Main Topics  . Smoking status: Current Every Day Smoker    Packs/day: 1.00    Years: 50.00    Types: Cigarettes    Last attempt to quit: 05/17/2016  . Smokeless tobacco: Never Used     Comment: smokes approximately 5 cigarettes a day  . Alcohol use No     Comment: occasional  . Drug use: No  . Sexual activity: Not on file   Other Topics Concern  . Not on file   Social History Narrative   No living will   4 children (2 of each). Son lives with her   DNR done 02/25/16 ("if it is time to go, it is time to go")    Review of Systems: See HPI,  otherwise negative ROS  Physical Exam: BP (!) 182/93   Pulse 99   Temp 97.3 F (36.3 C) (Tympanic)   Resp 20   Ht '4\' 11"'$  (1.499 m)   Wt 99 lb (44.9 kg)   SpO2 95%   BMI 20.00 kg/m  General:   Alert,  pleasant and cooperative in NAD Head:  Normocephalic and atraumatic. Neck:  Supple; no masses or thyromegaly. Lungs:  Clear throughout to auscultation.    Heart:  Regular rate and rhythm. Abdomen:  Soft, nontender and nondistended. Normal bowel sounds, without guarding, and without rebound.   Neurologic:  Alert and  oriented x4;  grossly normal neurologically.  Impression/Plan: Melissa Watkins is here for an endoscopy to be performed for evaluation of severe esophagitis and duodenal ulcer seen on EGD in  01/2016   Risks, benefits, limitations, and alternatives regarding  endoscopy have been reviewed with the patient.  Questions have been answered.  All parties agreeable.   Jonathon Bellows, MD  06/08/2016, 9:20 AM

## 2016-06-08 NOTE — Transfer of Care (Signed)
Immediate Anesthesia Transfer of Care Note  Patient: Melissa Watkins  Procedure(s) Performed: Procedure(s): ESOPHAGOGASTRODUODENOSCOPY (EGD) WITH PROPOFOL (N/A)  Patient Location: PACU  Anesthesia Type:General  Level of Consciousness: sedated  Airway & Oxygen Therapy: Patient Spontanous Breathing and Patient connected to nasal cannula oxygen  Post-op Assessment: Report given to RN and Post -op Vital signs reviewed and stable  Post vital signs: Reviewed and stable  Last Vitals:  Vitals:   06/08/16 0827  BP: (!) 182/93  Pulse: 99  Resp: 20  Temp: 36.3 C    Last Pain:  Vitals:   06/08/16 0827  TempSrc: Tympanic         Complications: No apparent anesthesia complications

## 2016-06-08 NOTE — Anesthesia Preprocedure Evaluation (Signed)
Anesthesia Evaluation  Patient identified by MRN, date of birth, ID band Patient confused    Reviewed: Allergy & Precautions, H&P , NPO status , Patient's Chart, lab work & pertinent test results  History of Anesthesia Complications Negative for: history of anesthetic complications  Airway Mallampati: III  TM Distance: <3 FB Neck ROM: limited    Dental  (+) Poor Dentition, Chipped, Missing   Pulmonary neg shortness of breath, pneumonia, COPD, Current Smoker,    Pulmonary exam normal breath sounds clear to auscultation       Cardiovascular Exercise Tolerance: Good hypertension, (-) angina(-) Past MI and (-) DOE negative cardio ROS Normal cardiovascular exam Rhythm:regular Rate:Normal     Neuro/Psych CVA negative neurological ROS  negative psych ROS   GI/Hepatic negative GI ROS, Neg liver ROS, PUD, neg GERD  ,  Endo/Other  negative endocrine ROS  Renal/GU Renal disease  negative genitourinary   Musculoskeletal   Abdominal   Peds  Hematology negative hematology ROS (+) anemia ,   Anesthesia Other Findings Patient has medical clearance for this procedure.  They feel that her pulmonary process is much resolved, she has been 97-100% on room air.  Patient is NPO appropriate and reports no nausea or vomiting today.   Past Medical History: No date: Back pain, chronic  Past Surgical History: No date: CHOLECYSTECTOMY No date: COLONOSCOPY 02/07/2016: HIP ARTHROPLASTY Right     Comment: Procedure: ARTHROPLASTY BIPOLAR HIP               (HEMIARTHROPLASTY);  Surgeon: Thornton Park,               MD;  Location: [facility] ORS;  Service: Orthopedics;              Laterality: Right; No date: OOPHORECTOMY  BMI    Body Mass Index:  22.67 kg/m      Reproductive/Obstetrics negative OB ROS                             Anesthesia Physical  Anesthesia Plan  ASA: III  Anesthesia Plan: General    Post-op Pain Management:    Induction:   Airway Management Planned:   Additional Equipment:   Intra-op Plan:   Post-operative Plan:   Informed Consent: I have reviewed the patients History and Physical, chart, labs and discussed the procedure including the risks, benefits and alternatives for the proposed anesthesia with the patient or authorized representative who has indicated his/her understanding and acceptance.   Dental Advisory Given  Plan Discussed with: Anesthesiologist, CRNA and Surgeon  Anesthesia Plan Comments: (Consent via son [name] at [phone])        Anesthesia Quick Evaluation

## 2016-06-09 ENCOUNTER — Encounter: Payer: Self-pay | Admitting: Gastroenterology

## 2016-06-12 NOTE — Progress Notes (Deleted)
.   06/13/2016 7:47 AM   Melissa Watkins 04-18-1945 [id number]  Referring provider: Venia Carbon, MD [place], [place] [address]  No chief complaint on file.   HPI: 71 yo WF who presents today for a one month follow up for history of urinary retention.    Background history F/u hospital consult urinary retention. Seen Feb 22, 2016 two weeks after out from RIGHT HIP HEMIARTHROPLASTY. She was readmitted from SNF. Cr rose from 0.86 to 1.2 and u/s showed bilateral hydro and > 999 ml. A foley was placed. Cr dropped to 0.98.  Today, she's in a wheelchair. She has back pain, but it sounds like it's from sitting in the wheelchair. Also, c/o stomach pain. She's getting to a bedside commode but still slow to ambulate.   She's a retired Scientific laboratory technician.  One week ago the Foley was removed for a voiding trial.  Since the catheter has been removed, she has been voiding.  She is not having suprapubic pain at this time.  She is not experiencing dysuria or gross hematuria.  She is not having fevers, chills, nausea or vomiting.  Her PVR was 488 mL.  At her visit one month ago, she was instructed on CIC.  After a few days, the patient did not desire to continue CIC and requested having an indwelling Foley replaced.  A Foley was not placed at the facility.  She states she has not had any difficultly urinating at this time.    At her visit one month ago, she felt she was emptying her bladder.  She was not experiencing dysuria, gross hematuria or suprapubic pain.  She was not having fevers, chills, nausea or vomiting.   Her PVR was 37 mL.    Today, the patient has been experiencing urgency x *** (***), frequency x *** (***), not/is restricting fluids to avoid visits to the restroom ***, not/is engaging in toilet mapping, incontinence x *** (***) and nocturia x *** (***).   Her PVR is ***.    PMH: Past Medical History:  Diagnosis Date  . Back pain, chronic   . COPD (chronic  obstructive pulmonary disease) ([place])   . Duodenal ulcer 01/2016   and esophagitis (hospitalized)  . Essential hypertension, benign   . Essential hypertension, benign   . Osteoarthritis   . Osteoporosis     Surgical History: Past Surgical History:  Procedure Laterality Date  . ABDOMINAL HYSTERECTOMY    . CHOLECYSTECTOMY    . COLONOSCOPY    . ESOPHAGOGASTRODUODENOSCOPY (EGD) WITH PROPOFOL N/A 02/24/2016   Procedure: ESOPHAGOGASTRODUODENOSCOPY (EGD) WITH PROPOFOL;  Surgeon: Jonathon Bellows, MD;  Location: [facility] ENDOSCOPY;  Service: Endoscopy;  Laterality: N/A;  . ESOPHAGOGASTRODUODENOSCOPY (EGD) WITH PROPOFOL N/A 06/08/2016   Procedure: ESOPHAGOGASTRODUODENOSCOPY (EGD) WITH PROPOFOL;  Surgeon: Jonathon Bellows, MD;  Location: [facility] ENDOSCOPY;  Service: Endoscopy;  Laterality: N/A;  . HIP ARTHROPLASTY Right 02/07/2016   Procedure: ARTHROPLASTY BIPOLAR HIP (HEMIARTHROPLASTY);  Surgeon: Thornton Park, MD;  Location: [facility] ORS;  Service: Orthopedics;  Laterality: Right;  . HIP FRACTURE SURGERY Right 01/2016  . OOPHORECTOMY      Home Medications:  Allergies as of 06/13/2016      Reactions   Oxycodone-acetaminophen Other (See Comments)   Hallucinations   Valium [diazepam]       Medication List       Accurate as of 06/12/16  7:47 AM. Always use your most recent med list.          acetaminophen  650 MG CR tablet Commonly known as:  TYLENOL Take 650 mg by mouth 3 (three) times daily.   albuterol 108 (90 Base) MCG/ACT inhaler Commonly known as:  PROVENTIL HFA;VENTOLIN HFA Inhale 2 puffs into the lungs every 6 (six) hours as needed for wheezing or shortness of breath.   amLODipine 2.5 MG tablet Commonly known as:  NORVASC Take 1 tablet (2.5 mg total) by mouth daily.   metoprolol tartrate 25 MG tablet Commonly known as:  LOPRESSOR Take 1 tablet (25 mg total) by mouth 2 (two) times daily.   omeprazole 40 MG capsule Commonly known as:  PRILOSEC Take 1 capsule (40 mg total) by mouth daily.         Allergies:  Allergies  Allergen Reactions  . Oxycodone-Acetaminophen Other (See Comments)    Hallucinations  . Valium [Diazepam]     Family History: Family History  Problem Relation Age of Onset  . Cancer Mother   . COPD Father   . Asthma Son   . Prostate cancer Neg Hx   . Kidney cancer Neg Hx   . Bladder Cancer Neg Hx     Social History:  reports that she has been smoking Cigarettes.  She has a 50.00 pack-year smoking history. She has never used smokeless tobacco. She reports that she does not drink alcohol or use drugs.  ROS:                                        Physical Exam: There were no vitals taken for this visit.  Constitutional:  Alert and oriented, No acute distress. HEENT: [place] AT, moist mucus membranes.  Trachea midline, no masses. Cardiovascular: No clubbing, cyanosis, or edema. Respiratory: Normal respiratory effort, no increased work of breathing. GI: Abdomen is soft, nontender, nondistended, no abdominal masses GU: No CVA tenderness. Urine clear.  Skin: No rashes, bruises or suspicious lesions. Lymph: No adenopathy. Neurologic: Grossly intact, no focal deficits, moving all 4 extremities. Psychiatric: Normal mood and affect.  Laboratory Data: Lab Results  Component Value Date   WBC 12.1 (H) 02/24/2016   HGB 8.8 (L) 02/24/2016   HCT 26.0 (L) 02/24/2016   MCV 88.0 02/24/2016   PLT 415 02/24/2016    Lab Results  Component Value Date   CREATININE 0.98 02/23/2016   Pertinent Imaging Results for LEOMA, FOLDS (MRN [id number]) as of 05/15/2016 10:56  Ref. Range 05/15/2016 10:49  Scan Result Unknown 37    Assessment & Plan:    1. History of urinary retention  - PVR is 37 cc  - RTC in one month for OAB questionnaire and PVR   No Follow-up on file.  Zara Council, [facility] [address] [place], [place] [address] [phone]

## 2016-06-13 ENCOUNTER — Encounter: Payer: Self-pay | Admitting: Urology

## 2016-06-13 ENCOUNTER — Ambulatory Visit: Payer: Medicare Other | Admitting: Urology

## 2016-07-13 ENCOUNTER — Emergency Department
Admission: EM | Admit: 2016-07-13 | Discharge: 2016-07-13 | Disposition: A | Discharge status: Home / Self Care | Payer: Medicare Other | Attending: Emergency Medicine | Admitting: Emergency Medicine

## 2016-07-13 ENCOUNTER — Encounter: Payer: Self-pay | Admitting: Emergency Medicine

## 2016-07-13 ENCOUNTER — Emergency Department: Payer: Medicare Other

## 2016-07-13 DIAGNOSIS — S52602A Unspecified fracture of lower end of left ulna, initial encounter for closed fracture: Secondary | ICD-10-CM | POA: Diagnosis not present

## 2016-07-13 DIAGNOSIS — Z79899 Other long term (current) drug therapy: Secondary | ICD-10-CM | POA: Insufficient documentation

## 2016-07-13 DIAGNOSIS — Y939 Activity, unspecified: Secondary | ICD-10-CM | POA: Insufficient documentation

## 2016-07-13 DIAGNOSIS — J449 Chronic obstructive pulmonary disease, unspecified: Secondary | ICD-10-CM | POA: Insufficient documentation

## 2016-07-13 DIAGNOSIS — F17210 Nicotine dependence, cigarettes, uncomplicated: Secondary | ICD-10-CM | POA: Insufficient documentation

## 2016-07-13 DIAGNOSIS — J45909 Unspecified asthma, uncomplicated: Secondary | ICD-10-CM | POA: Diagnosis not present

## 2016-07-13 DIAGNOSIS — W19XXXA Unspecified fall, initial encounter: Secondary | ICD-10-CM

## 2016-07-13 DIAGNOSIS — Y92002 Bathroom of unspecified non-institutional (private) residence single-family (private) house as the place of occurrence of the external cause: Secondary | ICD-10-CM | POA: Diagnosis not present

## 2016-07-13 DIAGNOSIS — Y998 Other external cause status: Secondary | ICD-10-CM | POA: Insufficient documentation

## 2016-07-13 DIAGNOSIS — W01198A Fall on same level from slipping, tripping and stumbling with subsequent striking against other object, initial encounter: Secondary | ICD-10-CM | POA: Diagnosis not present

## 2016-07-13 DIAGNOSIS — I10 Essential (primary) hypertension: Secondary | ICD-10-CM | POA: Insufficient documentation

## 2016-07-13 DIAGNOSIS — S52502A Unspecified fracture of the lower end of left radius, initial encounter for closed fracture: Secondary | ICD-10-CM | POA: Insufficient documentation

## 2016-07-13 DIAGNOSIS — Y92009 Unspecified place in unspecified non-institutional (private) residence as the place of occurrence of the external cause: Secondary | ICD-10-CM

## 2016-07-13 DIAGNOSIS — S4992XA Unspecified injury of left shoulder and upper arm, initial encounter: Secondary | ICD-10-CM | POA: Diagnosis present

## 2016-07-13 MED ORDER — ACETAMINOPHEN 325 MG PO TABS
650.0000 mg | ORAL_TABLET | Freq: Once | ORAL | Status: AC
  Administered 2016-07-13: 650 mg via ORAL
  Filled 2016-07-13: qty 2

## 2016-07-13 NOTE — Discharge Instructions (Signed)
Call Dr. Harden Mo office for an appointment. Leave splint on until seen by Dr. Mack Guise. Ice and elevate to reduce swelling. Tylenol or Excedrin as needed for pain. Continue using walker for extra support when possible.

## 2016-07-13 NOTE — ED Provider Notes (Signed)
[facility] Emergency Department Provider Note   ____________________________________________   First MD Initiated Contact with Patient 07/13/16 1409     (approximate)  I have reviewed the triage vital signs and the nursing notes.   HISTORY  Chief Complaint Arm Injury    HPI Melissa Watkins is a 71 y.o. female is a complaint of left wrist pain. Patient states that she was in the bathroom on Monday night and while using her walker and lost her balance causing her to fall forward. She denies any head injury or loss of consciousness. She is continued to have pain in her left wrist. Patient has been taking Excedrin for her pain.For the last 3 days patient has decreased use of her wrist and states that movement increases her pain. She rates her pain as 9/10.   Past Medical History:  Diagnosis Date  . Back pain, chronic   . COPD (chronic obstructive pulmonary disease) ([place])   . Duodenal ulcer 01/2016   and esophagitis (hospitalized)  . Essential hypertension, benign   . Essential hypertension, benign   . Osteoarthritis   . Osteoporosis     Patient Active Problem List   Diagnosis Date Noted  . Malnutrition of mild degree ([place]) 05/24/2016  . COPD (chronic obstructive pulmonary disease) ([place])   . Essential hypertension, benign   . Osteoporosis   . Osteoarthritis   . Acute bronchitis 02/25/2016  . COPD exacerbation ([place]) 02/25/2016  . Bilateral hydronephrosis 02/25/2016  . Bladder outlet obstruction 02/25/2016  . Duodenal ulcer 02/25/2016  . Acute esophagitis 02/25/2016  . Palpitations 07/17/2013    Past Surgical History:  Procedure Laterality Date  . ABDOMINAL HYSTERECTOMY    . CHOLECYSTECTOMY    . COLONOSCOPY    . ESOPHAGOGASTRODUODENOSCOPY (EGD) WITH PROPOFOL N/A 02/24/2016   Procedure: ESOPHAGOGASTRODUODENOSCOPY (EGD) WITH PROPOFOL;  Surgeon: Jonathon Bellows, MD;  Location: [facility] ENDOSCOPY;  Service: Endoscopy;  Laterality: N/A;  .  ESOPHAGOGASTRODUODENOSCOPY (EGD) WITH PROPOFOL N/A 06/08/2016   Procedure: ESOPHAGOGASTRODUODENOSCOPY (EGD) WITH PROPOFOL;  Surgeon: Jonathon Bellows, MD;  Location: [facility] ENDOSCOPY;  Service: Endoscopy;  Laterality: N/A;  . HIP ARTHROPLASTY Right 02/07/2016   Procedure: ARTHROPLASTY BIPOLAR HIP (HEMIARTHROPLASTY);  Surgeon: Thornton Park, MD;  Location: [facility] ORS;  Service: Orthopedics;  Laterality: Right;  . HIP FRACTURE SURGERY Right 01/2016  . OOPHORECTOMY      Prior to Admission medications   Medication Sig Start Date End Date Taking? Authorizing Provider  acetaminophen (TYLENOL) 650 MG CR tablet Take 650 mg by mouth 3 (three) times daily.    [provider]  albuterol (PROVENTIL HFA;VENTOLIN HFA) 108 (90 Base) MCG/ACT inhaler Inhale 2 puffs into the lungs every 6 (six) hours as needed for wheezing or shortness of breath. 04/21/16   Venia Carbon, MD  amLODipine (NORVASC) 2.5 MG tablet Take 1 tablet (2.5 mg total) by mouth daily. 05/24/16   Venia Carbon, MD  metoprolol tartrate (LOPRESSOR) 25 MG tablet Take 1 tablet (25 mg total) by mouth 2 (two) times daily. 05/17/16   Venia Carbon, MD  omeprazole (PRILOSEC) 40 MG capsule Take 1 capsule (40 mg total) by mouth daily. 05/23/16   Lucilla Lame, MD    Allergies Oxycodone-acetaminophen and Valium [diazepam]  Family History  Problem Relation Age of Onset  . Cancer Mother   . COPD Father   . Asthma Son   . Prostate cancer Neg Hx   . Kidney cancer Neg Hx   . Bladder Cancer Neg Hx  Social History Social History  Substance Use Topics  . Smoking status: Current Every Day Smoker    Packs/day: 1.00    Years: 50.00    Types: Cigarettes    Last attempt to quit: 05/17/2016  . Smokeless tobacco: Never Used     Comment: smokes approximately 5 cigarettes a day  . Alcohol use No     Comment: occasional    Review of Systems  Constitutional: No fever/chills Cardiovascular: Denies chest pain. Respiratory: Denies shortness  of breath. Gastrointestinal: No abdominal pain.  No nausea, no vomiting.   Musculoskeletal: Positive left wrist pain. Skin: Negative for rash. Neurological: Negative for headaches, focal weakness or numbness.   ____________________________________________   PHYSICAL EXAM:  VITAL SIGNS: ED Triage Vitals [07/13/16 1234]  Enc Vitals Group     BP (!) 147/83     Pulse      Resp 20     Temp 98.6 F (37 C)     Temp Source Oral     SpO2 95 %     Weight 99 lb (44.9 kg)     Height 4\' 11"  (1.499 m)     Head Circumference      Peak Flow      Pain Score 9     Pain Loc      Pain Edu?      Excl. in [place]?     Constitutional: Alert and oriented. Well appearing and in no acute distress. Eyes: Conjunctivae are normal. PERRL. EOMI. Head: Atraumatic. Neck: No stridor.   Cardiovascular: Normal rate, regular rhythm. Grossly normal heart sounds.  Good peripheral circulation. Respiratory: Normal respiratory effort.  No retractions. Lungs CTAB. Gastrointestinal: Soft and nontender. No distention. Musculoskeletal: Examination of the left wrist there is moderate tenderness on palpation along with soft tissue swelling and ecchymosis. Patient is able to move digits distally from her injury. Capillary refill is less than 3 seconds. Skin is intact. Motor sensory function intact. Neurologic:  Normal speech and language. No gross focal neurologic deficits are appreciated.  Skin:  Skin is warm, dry and intact. No rash noted. Psychiatric: Mood and affect are normal. Speech and behavior are normal.  ____________________________________________   LABS (all labs ordered are listed, but only abnormal results are displayed)  Labs Reviewed - No data to display  RADIOLOGY  Dg Wrist Complete Left  Result Date: 07/13/2016 CLINICAL DATA:  [address] [place] [address] with persistent pain and swelling EXAM: LEFT WRIST - COMPLETE 3+ VIEW COMPARISON:  None. FINDINGS: There is angulated fracture of the distal left radius with  the distal fragment the tilted dorsally. There is also a nondisplaced fracture of the distal ulna. The carpal bones are in normal position. The bones appear slightly osteopenic. IMPRESSION: Fractures of the distal left radius and distal left ulna with some angulation of the distal left radial fracture. Electronically Signed   By: Ivar Drape M.D.   On: 07/13/2016 13:16   I, Johnn Hai, personally viewed and evaluated these images (plain radiographs) as part of my medical decision making, as well as reviewing the written report by the radiologist. ____________________________________________   PROCEDURES  Procedure(s) performed: None  Procedures  Critical Care performed: No  ____________________________________________   INITIAL IMPRESSION / ASSESSMENT AND PLAN / ED COURSE  Pertinent labs & imaging results that were available during my care of the patient were reviewed by me and considered in my medical decision making (see chart for details).   A copy of her x-ray was shown to  the son and patient explaining her fracture. Patient was placed in a sugar tong splint and sling. She is to ice and elevate to help reduce the swelling. Son is aware that he needs to call Dr. Harden Mo office today to make an appointment for her to be seen. She knows that she will need to leave the splint on until seen by the orthopedist. We discussed pain medication however oxycodone caused hallucinations and she prefers not to try any other pain medication other than Tylenol or Excedrin.     ____________________________________________   FINAL CLINICAL IMPRESSION(S) / ED DIAGNOSES  Final diagnoses:  Closed fracture distal radius and ulna, left, initial encounter  Fall at home, initial encounter      NEW MEDICATIONS STARTED DURING THIS VISIT:  Discharge Medication List as of 07/13/2016  3:02 PM       Note:  This document was prepared using Dragon voice recognition software and may include  unintentional dictation errors.    Johnn Hai, PA-C 07/13/16 1540    Earleen Newport, MD 07/14/16 [phone]

## 2016-07-13 NOTE — ED Triage Notes (Signed)
Pt tripped and fell with walker Monday.  Swelling noted to left lower arm.  Pain mostly at wrist.  Radial pulse +2.

## 2016-07-13 NOTE — ED Notes (Signed)
See triage note  States she fell on Monday  Landed on left arm  Having pain to left wrist/forearm  Positive swelling and bruising noted

## 2016-07-25 ENCOUNTER — Ambulatory Visit: Payer: Medicare Other | Admitting: Internal Medicine

## 2016-08-29 ENCOUNTER — Telehealth: Payer: Self-pay | Admitting: Internal Medicine

## 2016-08-29 ENCOUNTER — Ambulatory Visit: Payer: Medicare Other | Admitting: Internal Medicine

## 2016-08-29 DIAGNOSIS — Z0289 Encounter for other administrative examinations: Secondary | ICD-10-CM

## 2016-08-29 NOTE — Telephone Encounter (Signed)
Patient did not come in for their appointment today for 3 mo follow up. Please let me know if patient needs to be contacted immediately for follow up or no follow up needed. Do you want to charge the NSF?  I called son 3 times, and line was busy each time I called.

## 2016-08-30 NOTE — Telephone Encounter (Signed)
She really should come back--but she is not much for doctors Try to get the son---- only charge NSF if no good excuse (don't charge if he tried and she refused) Only set up follow up if he thinks she will come

## 2017-01-09 ENCOUNTER — Emergency Department: Payer: Medicare Other

## 2017-01-09 ENCOUNTER — Encounter: Payer: Self-pay | Admitting: Emergency Medicine

## 2017-01-09 ENCOUNTER — Other Ambulatory Visit: Payer: Self-pay

## 2017-01-09 ENCOUNTER — Emergency Department
Admission: EM | Admit: 2017-01-09 | Discharge: 2017-01-09 | Disposition: A | Discharge status: Home / Self Care | Payer: Medicare Other | Attending: Student in an Organized Health Care Education/Training Program | Admitting: Student in an Organized Health Care Education/Training Program

## 2017-01-09 DIAGNOSIS — Y999 Unspecified external cause status: Secondary | ICD-10-CM | POA: Diagnosis not present

## 2017-01-09 DIAGNOSIS — W19XXXA Unspecified fall, initial encounter: Secondary | ICD-10-CM

## 2017-01-09 DIAGNOSIS — W010XXA Fall on same level from slipping, tripping and stumbling without subsequent striking against object, initial encounter: Secondary | ICD-10-CM | POA: Insufficient documentation

## 2017-01-09 DIAGNOSIS — S2242XA Multiple fractures of ribs, left side, initial encounter for closed fracture: Secondary | ICD-10-CM | POA: Diagnosis not present

## 2017-01-09 DIAGNOSIS — I10 Essential (primary) hypertension: Secondary | ICD-10-CM | POA: Diagnosis not present

## 2017-01-09 DIAGNOSIS — M1990 Unspecified osteoarthritis, unspecified site: Secondary | ICD-10-CM | POA: Insufficient documentation

## 2017-01-09 DIAGNOSIS — Y9389 Activity, other specified: Secondary | ICD-10-CM | POA: Diagnosis not present

## 2017-01-09 DIAGNOSIS — F17210 Nicotine dependence, cigarettes, uncomplicated: Secondary | ICD-10-CM | POA: Insufficient documentation

## 2017-01-09 DIAGNOSIS — R1010 Upper abdominal pain, unspecified: Secondary | ICD-10-CM | POA: Diagnosis not present

## 2017-01-09 DIAGNOSIS — S2239XA Fracture of one rib, unspecified side, initial encounter for closed fracture: Secondary | ICD-10-CM

## 2017-01-09 DIAGNOSIS — S199XXA Unspecified injury of neck, initial encounter: Secondary | ICD-10-CM | POA: Diagnosis not present

## 2017-01-09 DIAGNOSIS — Z79899 Other long term (current) drug therapy: Secondary | ICD-10-CM | POA: Diagnosis not present

## 2017-01-09 DIAGNOSIS — J449 Chronic obstructive pulmonary disease, unspecified: Secondary | ICD-10-CM | POA: Insufficient documentation

## 2017-01-09 DIAGNOSIS — S299XXA Unspecified injury of thorax, initial encounter: Secondary | ICD-10-CM | POA: Diagnosis not present

## 2017-01-09 DIAGNOSIS — Y92009 Unspecified place in unspecified non-institutional (private) residence as the place of occurrence of the external cause: Secondary | ICD-10-CM | POA: Diagnosis not present

## 2017-01-09 DIAGNOSIS — D1802 Hemangioma of intracranial structures: Secondary | ICD-10-CM | POA: Diagnosis not present

## 2017-01-09 DIAGNOSIS — S22030A Wedge compression fracture of third thoracic vertebra, initial encounter for closed fracture: Secondary | ICD-10-CM | POA: Insufficient documentation

## 2017-01-09 DIAGNOSIS — Z96641 Presence of right artificial hip joint: Secondary | ICD-10-CM | POA: Diagnosis not present

## 2017-01-09 DIAGNOSIS — S0990XA Unspecified injury of head, initial encounter: Secondary | ICD-10-CM | POA: Diagnosis not present

## 2017-01-09 DIAGNOSIS — R079 Chest pain, unspecified: Secondary | ICD-10-CM | POA: Diagnosis not present

## 2017-01-09 MED ORDER — OXYCODONE HCL 5 MG PO TABS: 2.5000 mg | ORAL_TABLET | Freq: Three times a day (TID) | ORAL | 0 refills | Status: DC | PRN

## 2017-01-09 MED ORDER — OXYCODONE HCL 5 MG PO TABS
5.0000 mg | ORAL_TABLET | Freq: Once | ORAL | Status: AC
  Administered 2017-01-09: 5 mg via ORAL

## 2017-01-09 MED ORDER — LIDOCAINE 5 % EX PTCH
1.0000 | MEDICATED_PATCH | CUTANEOUS | Status: DC
  Administered 2017-01-09: 1 via TRANSDERMAL
  Filled 2017-01-09: qty 1

## 2017-01-09 MED ORDER — LIDOCAINE 5 % EX PTCH
1.0000 | MEDICATED_PATCH | Freq: Two times a day (BID) | CUTANEOUS | 0 refills | Status: DC
Start: 2017-01-09 — End: 2017-11-24

## 2017-01-09 MED ORDER — OXYCODONE HCL 5 MG PO TABS
ORAL_TABLET | ORAL | Status: AC
  Filled 2017-01-09: qty 1

## 2017-01-09 MED ORDER — ACETAMINOPHEN 500 MG PO TABS
1000.0000 mg | ORAL_TABLET | Freq: Once | ORAL | Status: AC
  Administered 2017-01-09: 1000 mg via ORAL
  Filled 2017-01-09: qty 2

## 2017-01-09 MED ORDER — OXYCODONE HCL 5 MG PO TABS
5.0000 mg | ORAL_TABLET | Freq: Once | ORAL | Status: AC
  Administered 2017-01-09: 5 mg via ORAL
  Filled 2017-01-09: qty 1

## 2017-01-09 NOTE — ED Provider Notes (Signed)
[facility] Emergency Department Provider Note  ____________________________________________  Time seen: Approximately 3:17 PM  I have reviewed the triage vital signs and the nursing notes.   HISTORY  Chief Complaint Fall and Rib Injury   HPI Melissa Watkins is a 71 y.o. female history of osteoporosis and osteoarthritis, chronic back pain, recurrent falls who presents for evaluation of left-sided chest pain status post mechanical fall. Patient reports that she was walking from the bathroom into her room when she lost her balance and fell. She hit her chest onto the ground and is complaining of constant, moderate, sharp pain in the left chest wall. No shortness of breath. She is also complaining of upper thoracic midline pain since the fall. She is not sure if she hit her head. She denies neck pain. She denies abdominal pain or extremity pain, she denies lower back pain. She is not on blood thinners. No LOC. She was found immediately after the fall by her son when she started screaming for help.  Past Medical History:  Diagnosis Date  . Back pain, chronic   . COPD (chronic obstructive pulmonary disease) ([place])   . Duodenal ulcer 01/2016   and esophagitis (hospitalized)  . Essential hypertension, benign   . Essential hypertension, benign   . Osteoarthritis   . Osteoporosis     Patient Active Problem List   Diagnosis Date Noted  . Malnutrition of mild degree ([place]) 05/24/2016  . COPD (chronic obstructive pulmonary disease) ([place])   . Essential hypertension, benign   . Osteoporosis   . Osteoarthritis   . Acute bronchitis 02/25/2016  . COPD exacerbation ([place]) 02/25/2016  . Bilateral hydronephrosis 02/25/2016  . Bladder outlet obstruction 02/25/2016  . Duodenal ulcer 02/25/2016  . Acute esophagitis 02/25/2016  . Palpitations 07/17/2013    Past Surgical History:  Procedure Laterality Date  . ABDOMINAL HYSTERECTOMY    . CHOLECYSTECTOMY    .  COLONOSCOPY    . ESOPHAGOGASTRODUODENOSCOPY (EGD) WITH PROPOFOL N/A 02/24/2016   Procedure: ESOPHAGOGASTRODUODENOSCOPY (EGD) WITH PROPOFOL;  Surgeon: Jonathon Bellows, MD;  Location: [facility] ENDOSCOPY;  Service: Endoscopy;  Laterality: N/A;  . ESOPHAGOGASTRODUODENOSCOPY (EGD) WITH PROPOFOL N/A 06/08/2016   Procedure: ESOPHAGOGASTRODUODENOSCOPY (EGD) WITH PROPOFOL;  Surgeon: Jonathon Bellows, MD;  Location: [facility] ENDOSCOPY;  Service: Endoscopy;  Laterality: N/A;  . HIP ARTHROPLASTY Right 02/07/2016   Procedure: ARTHROPLASTY BIPOLAR HIP (HEMIARTHROPLASTY);  Surgeon: Thornton Park, MD;  Location: [facility] ORS;  Service: Orthopedics;  Laterality: Right;  . HIP FRACTURE SURGERY Right 01/2016  . OOPHORECTOMY      Prior to Admission medications   Medication Sig Start Date End Date Taking? Authorizing Provider  acetaminophen (TYLENOL) 650 MG CR tablet Take 650 mg by mouth 3 (three) times daily.    [provider]  albuterol (PROVENTIL HFA;VENTOLIN HFA) 108 (90 Base) MCG/ACT inhaler Inhale 2 puffs into the lungs every 6 (six) hours as needed for wheezing or shortness of breath. Patient not taking: Reported on 01/09/2017 04/21/16   Viviana Simpler I, MD  amLODipine (NORVASC) 2.5 MG tablet Take 1 tablet (2.5 mg total) by mouth daily. Patient not taking: Reported on 01/09/2017 05/24/16   Venia Carbon, MD  metoprolol tartrate (LOPRESSOR) 25 MG tablet Take 1 tablet (25 mg total) by mouth 2 (two) times daily. Patient not taking: Reported on 01/09/2017 05/17/16   Venia Carbon, MD  omeprazole (PRILOSEC) 40 MG capsule Take 1 capsule (40 mg total) by mouth daily. Patient not taking: Reported on 01/09/2017 05/23/16   Allen Norris,  Darren, MD  oxyCODONE (ROXICODONE) 5 MG immediate release tablet Take 0.5 tablets (2.5 mg total) by mouth every 8 (eight) hours as needed for severe pain. 01/09/17 01/09/18  Rudene Re, MD    Allergies Oxycodone-acetaminophen and Valium [diazepam]  Family History  Problem Relation Age  of Onset  . Cancer Mother   . COPD Father   . Asthma Son   . Prostate cancer Neg Hx   . Kidney cancer Neg Hx   . Bladder Cancer Neg Hx     Social History Social History   Tobacco Use  . Smoking status: Current Every Day Smoker    Packs/day: 1.00    Years: 50.00    Pack years: 50.00    Types: Cigarettes    Last attempt to quit: 05/17/2016    Years since quitting: 0.6  . Smokeless tobacco: Never Used  . Tobacco comment: smokes approximately 5 cigarettes a day  Substance Use Topics  . Alcohol use: No    Comment: occasional  . Drug use: No    Review of Systems Constitutional: Negative for fever. Eyes: Negative for visual changes. ENT: Negative for facial injury or neck injury Cardiovascular: + chest injury. Respiratory: Negative for shortness of breath.  Gastrointestinal: Negative for abdominal pain or injury. Genitourinary: Negative for dysuria. Musculoskeletal: + upper back, negative for arm or leg pain. Skin: Negative for laceration/abrasions. Neurological: Negative for head injury.   ____________________________________________   PHYSICAL EXAM:  VITAL SIGNS: ED Triage Vitals  Enc Vitals Group     BP 01/09/17 1325 (!) 122/109     Pulse Rate 01/09/17 1325 95     Resp 01/09/17 1325 18     Temp 01/09/17 1325 (!) 97.2 F (36.2 C)     Temp Source 01/09/17 1325 Oral     SpO2 01/09/17 1325 92 %     Weight 01/09/17 1326 91 lb 2 oz (41.3 kg)     Height 01/09/17 1326 5\' 2"  (1.575 m)     Head Circumference --      Peak Flow --      Pain Score --      Pain Loc --      Pain Edu? --      Excl. in [place]? --    Constitutional: Alert and oriented. No acute distress. Does not appear intoxicated. HEENT Head: Normocephalic and atraumatic. Face: No facial bony tenderness. Stable midface Ears: No hemotympanum bilaterally. No Battle sign Eyes: No eye injury. PERRL. No raccoon eyes Nose: Nontender. No epistaxis. No rhinorrhea Mouth/Throat: Mucous membranes are moist. No  oropharyngeal blood. No dental injury. Airway patent without stridor. Normal voice. Neck: no C-collar in place. No midline c-spine tenderness.  Cardiovascular: Normal rate, regular rhythm. Normal and symmetric distal pulses are present in all extremities. Pulmonary/Chest: Chest wall is ttp over the L lateral aspect. Normal respiratory effort. Breath sounds are normal. No crepitus.  Abdominal: Soft, nontender, non distended. Musculoskeletal: Nontender with normal full range of motion in all extremities. No deformities. Upper midline thoracic ttp. No lumbar midline spinal tenderness. Pelvis is stable. Skin: Skin is warm, dry and intact. No abrasions or contutions. Psychiatric: Speech and behavior are appropriate. Neurological: Normal speech and language. Moves all extremities to command. No gross focal neurologic deficits are appreciated.  Glascow Coma Score: 4 - Opens eyes on own 6 - Follows simple motor commands 5 - Alert and oriented GCS: 15  ____________________________________________   LABS (all labs ordered are listed, but only abnormal results are displayed)  Labs Reviewed - No data to display ____________________________________________  EKG  none ____________________________________________  RADIOLOGY  CT head: Small hyperdense areas within the inferior left internal capsule and left side of the brainstem. I favor these artifactual or possibly related to calcium deposits. Cannot completely exclude small areas of acute hemorrhage although hemorrhage within these deep brain structures would be unusual for for a fall. May consider further evaluation with MRI or follow-up CT if felt clinically indicated.   CT cspine: Partially imaged is apparent mild compression at the T3 vertebral body. When compared to prior chest CT, this is new. Soft tissues and spinal canal: Prevertebral soft tissues are normal. No epidural or paraspinal hematoma. Disc levels: Mild degenerative disc  disease in the lower cervical spine. Mild diffuse degenerative facet disease, left greater than right.   XR thoracic spine: 1. Multiple thoracic compression deformities, many of which appear Old. 2. However compression of T3 may be acute or subacute. Correlate clinically and consider MRI to assess acuity. 3. Diffuse osteopenia.   CXR:  1. Possible fractures of the lateral left seventh and eighth ribs. Correlate clinically and consider left rib detail films. 2. Hyper aeration but no pneumonia or effusion. 3. Osteopenia with multiple thoracic compression deformities and an old healed sternal fracture. ____________________________________________   PROCEDURES  Procedure(s) performed: None Procedures Critical Care performed:  None ____________________________________________   INITIAL IMPRESSION / ASSESSMENT AND PLAN / ED COURSE   71 y.o. female history of osteoporosis and osteoarthritis, chronic back pain, recurrent falls who presents for evaluation of left-sided chest pain status post mechanical fall. CXR concerning for 7th and 8th L rib fractures. CT head with questionable calcification vs brainstem small hemorrhage. MRI will be done to determine etiology. XR thoracic spine showing new T3 compression fracture. Discussed with Dr. Lacinda Axon, neurosurgeon who recommended standing XR of the thoracic spine and he will evaluate the patient in the ED. Tylenol and oxycodone for pain control. Incentive spirometer for home. Care transferred to Dr. Quentin Cornwall.      As part of my medical decision making, I reviewed the following data within the [place] notes reviewed and incorporated, Patient signed out to Dr. Quentin Cornwall, Radiograph reviewed , A consult was requested and obtained from this/these consultant(s) Neurosurgery, Notes from prior ED visits and [place] Controlled Substance Database    Pertinent labs & imaging results that were available during my care of the patient  were reviewed by me and considered in my medical decision making (see chart for details).    ____________________________________________   FINAL CLINICAL IMPRESSION(S) / ED DIAGNOSES  Final diagnoses:  Rib fracture  Fall, initial encounter  Closed fracture of multiple ribs of left side, initial encounter  Traumatic compression fracture of T3 thoracic vertebra, closed, initial encounter ([place])      NEW MEDICATIONS STARTED DURING THIS VISIT:  ED Discharge Orders        Ordered    oxyCODONE (ROXICODONE) 5 MG immediate release tablet  Every 8 hours PRN     01/09/17 1533       Note:  This document was prepared using Dragon voice recognition software and may include unintentional dictation errors.    Rudene Re, MD 01/11/17 [phone]

## 2017-01-09 NOTE — ED Provider Notes (Signed)
Patient received in sign-out from Dr. Alfred Levins.  Workup and evaluation pending MRI.  Patient has evidence of mildly displaced rib fractures.  No pneumothorax.  There are only 2.  She has no respiratory distress or hypoxia.  Patient was evaluated by neurosurgery regarding compression fractures.  Nonoperable.  Has recommended TLSO brace but patient initially deferred this stating that she would not wear it.  MRI was ordered due to concern for subarachnoid hemorrhage.  MRI shows evidence of chronic micro hemorrhages likely secondary to chronic hypertensive disease.  No evidence of acute abnormality.  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  Patient was fitted with TLSO brace here in the ER.  Pain controlled.  Patient answered to follow-up with PCP.  Discussed strict return precautions.     Merlyn Lot, MD 01/09/17 2059

## 2017-01-09 NOTE — ED Notes (Signed)
Pt waiting on mri

## 2017-01-09 NOTE — Consult Note (Signed)
Neurosurgery-New Consultation Evaluation 01/09/2017 Melissa JUSTEN [id number]  Identifying Statement: Melissa Watkins is a 71 y.o. female from [place] [address] with fall and thoracic fracture  Physician Requesting Consultation: Dr. Quentin Cornwall, [place] [facility]  History of Present Illness: Melissa Watkins is here after a fall today and increased back pain. Xray showed new compression fracture at T3 and multiple chronic fractures throughout the thoracic spine. She had no weakness, numbness, or new extremity pain. She has a diagnosis of osteoporosis but unknown prior treatment per son. She describes the pain in her back as severe. She has previously worn a brace but felt that it was uncomfortable to wear.   Past Medical History:  Past Medical History:  Diagnosis Date  . Back pain, chronic   . COPD (chronic obstructive pulmonary disease) ([place])   . Duodenal ulcer 01/2016   and esophagitis (hospitalized)  . Essential hypertension, benign   . Essential hypertension, benign   . Osteoarthritis   . Osteoporosis     Social History: Social History   Socioeconomic History  . Marital status: Widowed    Spouse name: Not on file  . Number of children: 4  . Years of education: Not on file  . Highest education level: Not on file  Social Needs  . Financial resource strain: Not on file  . Food insecurity - worry: Not on file  . Food insecurity - inability: Not on file  . Transportation needs - medical: Not on file  . Transportation needs - non-medical: Not on file  Occupational History  . Occupation: Marine scientist (travel and hemodialysis)    Comment: Retired  Tobacco Use  . Smoking status: Current Every Day Smoker    Packs/day: 1.00    Years: 50.00    Pack years: 50.00    Types: Cigarettes    Last attempt to quit: 05/17/2016    Years since quitting: 0.6  . Smokeless tobacco: Never Used  . Tobacco comment: smokes approximately 5 cigarettes a day  Substance and Sexual Activity  . Alcohol use: No     Comment: occasional  . Drug use: No  . Sexual activity: Not on file  Other Topics Concern  . Not on file  Social History Narrative   No living will   4 children (2 of each). Son lives with her   DNR done 02/25/16 ("if it is time to go, it is time to go")    Family History: Family History  Problem Relation Age of Onset  . Cancer Mother   . COPD Father   . Asthma Son   . Prostate cancer Neg Hx   . Kidney cancer Neg Hx   . Bladder Cancer Neg Hx     Review of Systems:  Review of Systems - General ROS: Negative Psychological ROS: Negative Ophthalmic ROS: Negative ENT ROS: Negative Hematological and Lymphatic ROS: Negative  Endocrine ROS: Negative Respiratory ROS: Negative Cardiovascular ROS: Negative Gastrointestinal ROS: Negative Genito-Urinary ROS: Negative Musculoskeletal ROS: Positive for back pain Neurological ROS: Negative for weakness or numbness Dermatological ROS: Negative  Physical Exam: BP (!) 157/91   Pulse 92   Temp (!) 97.2 F (36.2 C) (Oral)   Resp 18   Ht 5\' 2"  (1.575 m)   Wt 41.3 kg (91 lb 2 oz)   SpO2 96%   BMI 16.67 kg/m  Body mass index is 16.67 kg/m. Body surface area is 1.34 meters squared. General appearance: Alert, cooperative, in no acute distress Head: Normocephalic, atraumatic Eyes: Normal, EOM intact Oropharynx:  Moist without lesions Ext: No edema in LE bilaterally  Neurologic exam:  Mental status: alertness: alert, affect: normal Speech: fluent and clear Motor:strength symmetric 5/5 in bilateral lower extremities in all motor groups Sensory: intact to light touch in bilateral lower extremities Gait: Not tested given pain   Imaging: Thoracic spine xray: . Multiple thoracic compression deformities, many of which appear old. 2. However compression of T3 may be acute or subacute. Correlate clinically and consider MRI to assess acuity. 3. Diffuse osteopenia.   Impression/Plan:  Ms. Melissa Watkins is here after a fall with possible  new acute compression fracture at T3 with chronic fractures throughout. There appears to be no retropulsion or severe angulation changes. Her neurological exam is reassuring. There is no surgical intervention warranted. I do not think kyphoplasty is feasible at this level and given her bone quality, may result in more harm than benefit.   1.  Diagnosis: Multiple thoracic compression fractures  2.  Plan - Recommend TLSO brace for comfort - Recommend medication as needed for pain control - Recommend referral to endocrinology to discuss osteoporosis treatment. - Can follow up as outpatient with xrays in neurosurgery clinic

## 2017-01-09 NOTE — ED Notes (Signed)
Pt waiting on mri.  Pt alert.  Family with pt.

## 2017-01-09 NOTE — ED Notes (Signed)
Patient transported to MRI 

## 2017-01-09 NOTE — ED Notes (Signed)
ED Provider at bedside. 

## 2017-01-09 NOTE — ED Notes (Signed)
Patient transported to CT 

## 2017-01-09 NOTE — Discharge Instructions (Signed)
Pain control: Take tylenol 1000mg  ( 2 extra strength) every 8 hours. Take 2.5mg  of oxycodone every 6-8 hours for breakthrough pain. If you need the oxycodone make sure to take one senokot as well to prevent constipation.  Use incentive spirometer several times a day.  Do not drink alcohol, drive or participate in any other potentially dangerous activities while taking this medication as it may make you sleepy. Do not take this medication with any other sedating medications, either prescription or over-the-counter.

## 2017-01-09 NOTE — ED Notes (Signed)
Resumed care from [facility].  Pt alert.  Family with pt.

## 2017-01-09 NOTE — ED Triage Notes (Signed)
Per ems patientfell sometime last night at home. Patient said her whole ribs hurt,especilly sternum.  Says she isnot sure how she landed.

## 2017-01-10 ENCOUNTER — Telehealth: Payer: Self-pay

## 2017-01-10 NOTE — Telephone Encounter (Signed)
Spoke to pt's son, [name]. He said she seems to be doing okay today. She is taking Tylenol  Until the pain medication comes this afternoon. Said she feels more confident with the brace on.

## 2017-02-06 DIAGNOSIS — S22039A Unspecified fracture of third thoracic vertebra, initial encounter for closed fracture: Secondary | ICD-10-CM | POA: Diagnosis not present

## 2017-02-06 DIAGNOSIS — S22039D Unspecified fracture of third thoracic vertebra, subsequent encounter for fracture with routine healing: Secondary | ICD-10-CM | POA: Diagnosis not present

## 2017-07-01 IMAGING — CR DG WRIST COMPLETE 3+V*L*
1 series · 4 of 4 positions shown · non-contrast
Comparison: None.

CLINICAL DATA: Fell on S[place] with persistent pain and swelling

EXAM:
LEFT WRIST - COMPLETE 3+ VIEW

[Series 1: dg wrist complete left · 0.14mm/px · 4 of 4 slices shown]
[im 1/4]
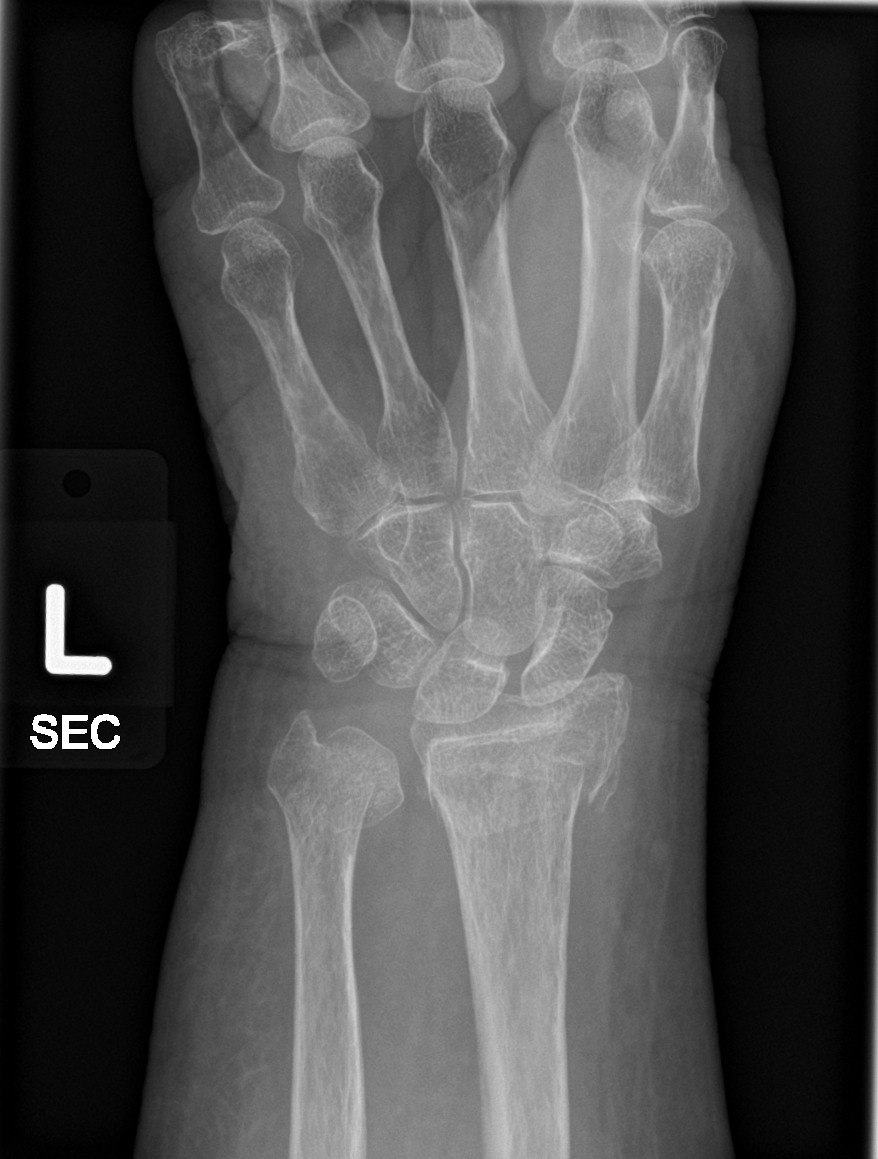
[im 2/4]
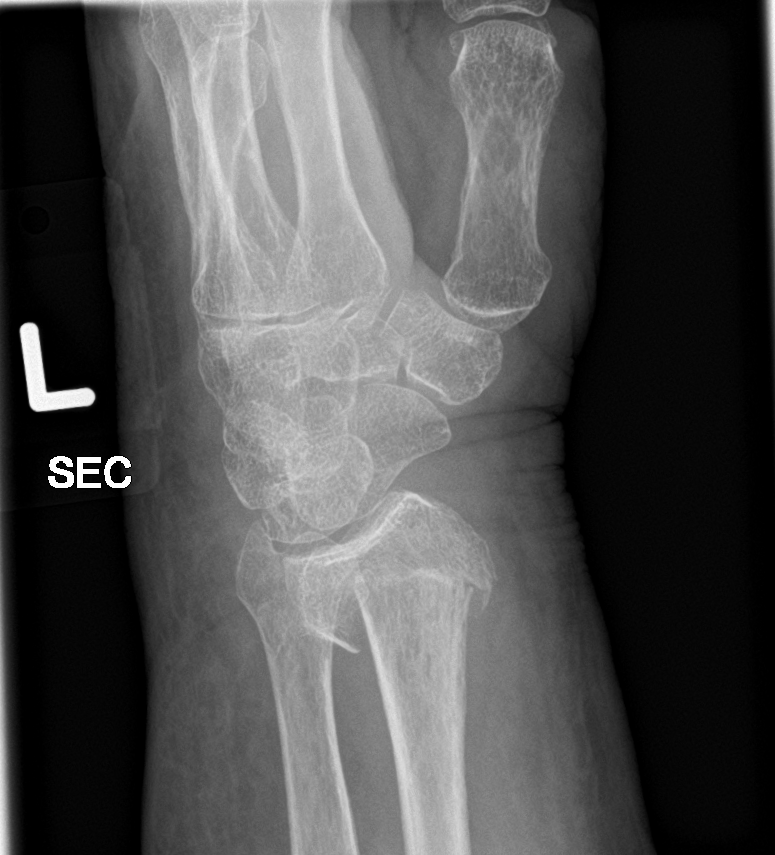
[im 3/4]
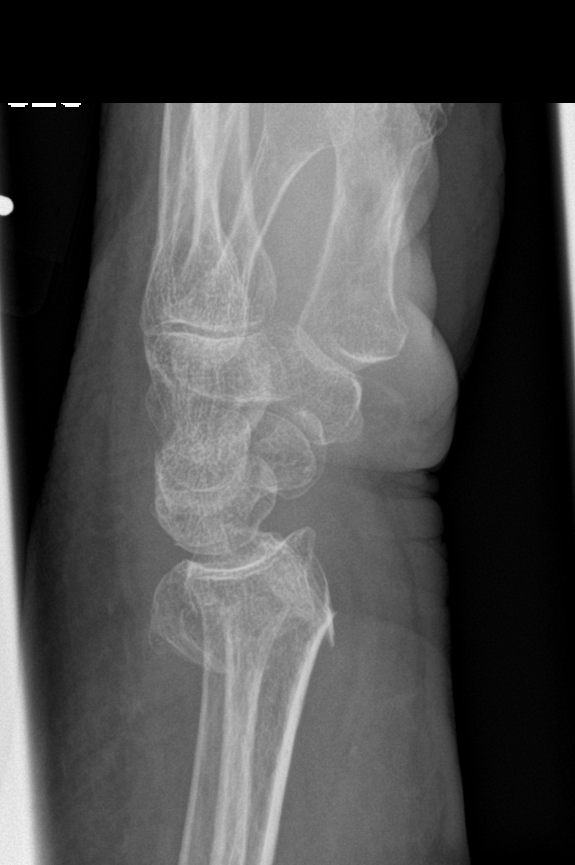
[im 4/4]
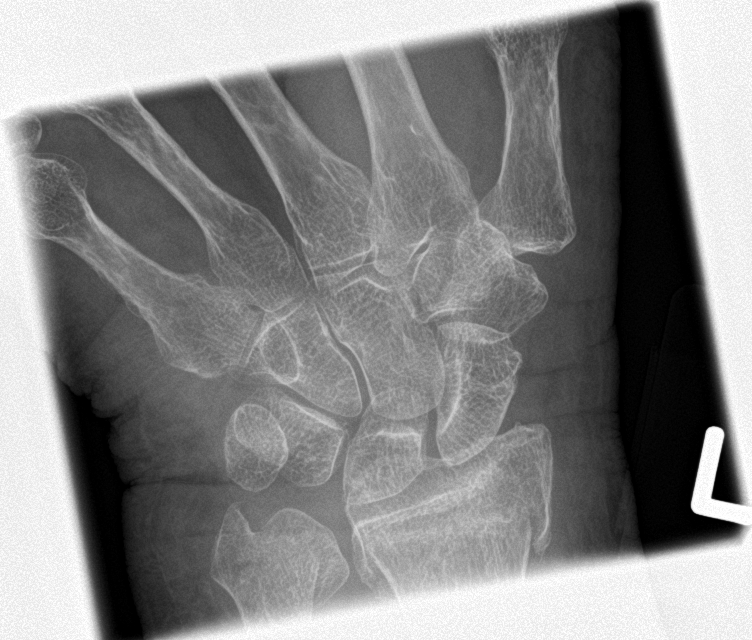

[4 of 4 positions shown; findings below may reference images not displayed]

FINDINGS: There is angulated fracture of the distal left radius with the
distal fragment the tilted dorsally. There is also a nondisplaced
fracture of the distal ulna. The carpal bones are in normal
position. The bones appear slightly osteopenic.
IMPRESSION: Fractures of the distal left radius and distal left ulna with some
angulation of the distal left radial fracture.

## 2017-11-21 ENCOUNTER — Emergency Department: Payer: Medicare Other

## 2017-11-21 ENCOUNTER — Inpatient Hospital Stay
Admission: EM | Admit: 2017-11-21 | Discharge: 2017-11-24 | DRG: 180 | Disposition: A | Discharge status: Skilled Nursing Facility | Payer: Medicare Other | Attending: Internal Medicine | Admitting: Internal Medicine

## 2017-11-21 ENCOUNTER — Other Ambulatory Visit: Payer: Self-pay

## 2017-11-21 DIAGNOSIS — Z66 Do not resuscitate: Secondary | ICD-10-CM | POA: Diagnosis not present

## 2017-11-21 DIAGNOSIS — R633 Feeding difficulties: Secondary | ICD-10-CM | POA: Diagnosis present

## 2017-11-21 DIAGNOSIS — M549 Dorsalgia, unspecified: Secondary | ICD-10-CM | POA: Diagnosis present

## 2017-11-21 DIAGNOSIS — Z885 Allergy status to narcotic agent status: Secondary | ICD-10-CM

## 2017-11-21 DIAGNOSIS — R64 Cachexia: Secondary | ICD-10-CM | POA: Diagnosis not present

## 2017-11-21 DIAGNOSIS — C3411 Malignant neoplasm of upper lobe, right bronchus or lung: Secondary | ICD-10-CM | POA: Diagnosis not present

## 2017-11-21 DIAGNOSIS — J439 Emphysema, unspecified: Secondary | ICD-10-CM | POA: Diagnosis not present

## 2017-11-21 DIAGNOSIS — M1990 Unspecified osteoarthritis, unspecified site: Secondary | ICD-10-CM | POA: Diagnosis present

## 2017-11-21 DIAGNOSIS — G8929 Other chronic pain: Secondary | ICD-10-CM | POA: Diagnosis present

## 2017-11-21 DIAGNOSIS — R590 Localized enlarged lymph nodes: Secondary | ICD-10-CM | POA: Diagnosis present

## 2017-11-21 DIAGNOSIS — Z23 Encounter for immunization: Secondary | ICD-10-CM

## 2017-11-21 DIAGNOSIS — Z96641 Presence of right artificial hip joint: Secondary | ICD-10-CM | POA: Diagnosis present

## 2017-11-21 DIAGNOSIS — R627 Adult failure to thrive: Secondary | ICD-10-CM | POA: Diagnosis present

## 2017-11-21 DIAGNOSIS — Z7401 Bed confinement status: Secondary | ICD-10-CM

## 2017-11-21 DIAGNOSIS — L8990 Pressure ulcer of unspecified site, unspecified stage: Secondary | ICD-10-CM | POA: Diagnosis present

## 2017-11-21 DIAGNOSIS — M810 Age-related osteoporosis without current pathological fracture: Secondary | ICD-10-CM | POA: Diagnosis present

## 2017-11-21 DIAGNOSIS — F17210 Nicotine dependence, cigarettes, uncomplicated: Secondary | ICD-10-CM | POA: Diagnosis not present

## 2017-11-21 DIAGNOSIS — R296 Repeated falls: Secondary | ICD-10-CM | POA: Diagnosis present

## 2017-11-21 DIAGNOSIS — C3431 Malignant neoplasm of lower lobe, right bronchus or lung: Secondary | ICD-10-CM | POA: Diagnosis not present

## 2017-11-21 DIAGNOSIS — I10 Essential (primary) hypertension: Secondary | ICD-10-CM | POA: Diagnosis present

## 2017-11-21 DIAGNOSIS — E876 Hypokalemia: Secondary | ICD-10-CM | POA: Diagnosis not present

## 2017-11-21 DIAGNOSIS — J449 Chronic obstructive pulmonary disease, unspecified: Secondary | ICD-10-CM | POA: Diagnosis not present

## 2017-11-21 DIAGNOSIS — Z9181 History of falling: Secondary | ICD-10-CM

## 2017-11-21 DIAGNOSIS — Z681 Body mass index (BMI) 19 or less, adult: Secondary | ICD-10-CM

## 2017-11-21 DIAGNOSIS — E43 Unspecified severe protein-calorie malnutrition: Secondary | ICD-10-CM | POA: Diagnosis present

## 2017-11-21 DIAGNOSIS — E860 Dehydration: Secondary | ICD-10-CM | POA: Diagnosis not present

## 2017-11-21 DIAGNOSIS — Z515 Encounter for palliative care: Secondary | ICD-10-CM | POA: Diagnosis not present

## 2017-11-21 DIAGNOSIS — H9190 Unspecified hearing loss, unspecified ear: Secondary | ICD-10-CM | POA: Diagnosis present

## 2017-11-21 DIAGNOSIS — R918 Other nonspecific abnormal finding of lung field: Secondary | ICD-10-CM

## 2017-11-21 DIAGNOSIS — Z7189 Other specified counseling: Secondary | ICD-10-CM

## 2017-11-21 DIAGNOSIS — Z720 Tobacco use: Secondary | ICD-10-CM | POA: Diagnosis not present

## 2017-11-21 DIAGNOSIS — Z591 Inadequate housing: Secondary | ICD-10-CM | POA: Diagnosis not present

## 2017-11-21 DIAGNOSIS — Z716 Tobacco abuse counseling: Secondary | ICD-10-CM | POA: Diagnosis not present

## 2017-11-21 DIAGNOSIS — I700 Atherosclerosis of aorta: Secondary | ICD-10-CM | POA: Diagnosis not present

## 2017-11-21 DIAGNOSIS — Z79899 Other long term (current) drug therapy: Secondary | ICD-10-CM

## 2017-11-21 DIAGNOSIS — Z825 Family history of asthma and other chronic lower respiratory diseases: Secondary | ICD-10-CM

## 2017-11-21 DIAGNOSIS — M6281 Muscle weakness (generalized): Secondary | ICD-10-CM | POA: Diagnosis not present

## 2017-11-21 DIAGNOSIS — R1312 Dysphagia, oropharyngeal phase: Secondary | ICD-10-CM | POA: Diagnosis not present

## 2017-11-21 LAB — CBC WITH DIFFERENTIAL/PLATELET
Abs Immature Granulocytes: 0.03 10*3/uL (ref 0.00–0.07)
BASOS PCT: 1 %
Basophils Absolute: 0 10*3/uL (ref 0.0–0.1)
EOS ABS: 0 10*3/uL (ref 0.0–0.5)
EOS PCT: 0 %
HCT: 37 % (ref 36.0–46.0)
Hemoglobin: 11.7 g/dL — ABNORMAL LOW (ref 12.0–15.0)
IMMATURE GRANULOCYTES: 1 %
Lymphocytes Relative: 19 %
Lymphs Abs: 1.2 10*3/uL (ref 0.7–4.0)
MCH: 29.3 pg (ref 26.0–34.0)
MCHC: 31.6 g/dL (ref 30.0–36.0)
MCV: 92.7 fL (ref 80.0–100.0)
Monocytes Absolute: 0.5 10*3/uL (ref 0.1–1.0)
Monocytes Relative: 9 %
NEUTROS PCT: 70 %
Neutro Abs: 4.3 10*3/uL (ref 1.7–7.7)
PLATELETS: 264 10*3/uL (ref 150–400)
RBC: 3.99 MIL/uL (ref 3.87–5.11)
RDW: 14.9 % (ref 11.5–15.5)
WBC: 6 10*3/uL (ref 4.0–10.5)
nRBC: 0 % (ref 0.0–0.2)

## 2017-11-21 LAB — COMPREHENSIVE METABOLIC PANEL
ALBUMIN: 2.5 g/dL — AB (ref 3.5–5.0)
ALK PHOS: 121 U/L (ref 38–126)
ALT: 16 U/L (ref 0–44)
ANION GAP: 9 (ref 5–15)
AST: 34 U/L (ref 15–41)
BILIRUBIN TOTAL: 0.6 mg/dL (ref 0.3–1.2)
BUN: 23 mg/dL (ref 8–23)
CO2: 31 mmol/L (ref 22–32)
Calcium: 8.5 mg/dL — ABNORMAL LOW (ref 8.9–10.3)
Chloride: 100 mmol/L (ref 98–111)
Creatinine, Ser: 0.62 mg/dL (ref 0.44–1.00)
Glucose, Bld: 180 mg/dL — ABNORMAL HIGH (ref 70–99)
POTASSIUM: 3 mmol/L — AB (ref 3.5–5.1)
Sodium: 140 mmol/L (ref 135–145)
TOTAL PROTEIN: 6.1 g/dL — AB (ref 6.5–8.1)

## 2017-11-21 LAB — CK: Total CK: 19 U/L — ABNORMAL LOW (ref 38–234)

## 2017-11-21 MED ORDER — POTASSIUM CHLORIDE 10 MEQ/100ML IV SOLN
10.0000 meq | Freq: Once | INTRAVENOUS | Status: AC
  Administered 2017-11-21: 10 meq via INTRAVENOUS
  Filled 2017-11-21: qty 100

## 2017-11-21 MED ORDER — IOHEXOL 300 MG/ML  SOLN
75.0000 mL | Freq: Once | INTRAMUSCULAR | Status: AC | PRN
  Administered 2017-11-21: 75 mL via INTRAVENOUS

## 2017-11-21 MED ORDER — SODIUM CHLORIDE 0.9 % IV BOLUS
500.0000 mL | Freq: Once | INTRAVENOUS | Status: AC
  Administered 2017-11-21: 500 mL via INTRAVENOUS

## 2017-11-21 NOTE — ED Notes (Signed)
Pt given meal tray and drink.

## 2017-11-21 NOTE — ED Triage Notes (Signed)
Pt arrived via [facility] from home. EMS reports that social services did a welfare check of the pt's home and found it in severely poor condition, in a contaminated apartment and the pt was covered in feces and insects. The sheriff was called and EMS was called to bring the pt to the ED. Per social services [facility]) pt is not to go back home. Pt reports that she lives with her son and cannot remember when the last time she has eaten. Pt was bathed and a gown was placed on her with warm blankets. She is AOx4, hard of hearing, in bed with rails upx2 and on the monitor. We will continue to monitor the pt.

## 2017-11-21 NOTE — H&P (Signed)
[name] NAME: Melissa Watkins    MR#:  [id number]  DATE OF BIRTH:  30-Mar-1945  DATE OF ADMISSION:  11/21/2017  PRIMARY CARE PHYSICIAN: Venia Carbon, MD   REQUESTING/REFERRING PHYSICIAN:   CHIEF COMPLAINT:   Chief Complaint  Patient presents with  . Weight Loss    Faliure to Thrive     HISTORY OF PRESENT ILLNESS: Melissa Watkins  is a 72 y.o. female with a known history of tobacco abuse, COPD, chronic back pain, hypertension, osteoarthritis and osteoporosis. Patient is a very poor historian due to generalized weakness and drowsiness at this time, as well as severe hearing deficit.  Information was gathered from reviewing the medical records and from discussion with emergency room physician. She was brought to emergency room due to severe generalized weakness.  During a welfare check done by social services, patient's house was found to be uninhabitable, contaminated with feces and insects. Patient admits to decreased appetite and weight loss in the past few months, gradually getting worse.  She complains of severe generalized weakness and frequent falls. Blood test done emergency room, including CBC and CMP reveal low potassium level, at 3.  Albumin level is low at 2.5. CT scan of the chest shows Right Upper Lobe Lung Cancer suspected: several new spiculated upper lobe lung nodules since 2018 are associated with new and bulky superior mediastinal lymphadenopathy (nodes up to 3.8 cm).  She is admitted for further evaluation and treatment.  PAST MEDICAL HISTORY:   Past Medical History:  Diagnosis Date  . Back pain, chronic   . COPD (chronic obstructive pulmonary disease) ([place])   . Duodenal ulcer 01/2016   and esophagitis (hospitalized)  . Essential hypertension, benign   . Essential hypertension, benign   . Osteoarthritis   . Osteoporosis     PAST SURGICAL HISTORY:  Past Surgical History:  Procedure Laterality  Date  . ABDOMINAL HYSTERECTOMY    . CHOLECYSTECTOMY    . COLONOSCOPY    . ESOPHAGOGASTRODUODENOSCOPY (EGD) WITH PROPOFOL N/A 02/24/2016   Procedure: ESOPHAGOGASTRODUODENOSCOPY (EGD) WITH PROPOFOL;  Surgeon: Jonathon Bellows, MD;  Location: [facility] ENDOSCOPY;  Service: Endoscopy;  Laterality: N/A;  . ESOPHAGOGASTRODUODENOSCOPY (EGD) WITH PROPOFOL N/A 06/08/2016   Procedure: ESOPHAGOGASTRODUODENOSCOPY (EGD) WITH PROPOFOL;  Surgeon: Jonathon Bellows, MD;  Location: [facility] ENDOSCOPY;  Service: Endoscopy;  Laterality: N/A;  . HIP ARTHROPLASTY Right 02/07/2016   Procedure: ARTHROPLASTY BIPOLAR HIP (HEMIARTHROPLASTY);  Surgeon: Thornton Park, MD;  Location: [facility] ORS;  Service: Orthopedics;  Laterality: Right;  . HIP FRACTURE SURGERY Right 01/2016  . OOPHORECTOMY      SOCIAL HISTORY:  Social History   Tobacco Use  . Smoking status: Current Every Day Smoker    Packs/day: 1.00    Years: 50.00    Pack years: 50.00    Types: Cigarettes    Last attempt to quit: 05/17/2016    Years since quitting: 1.5  . Smokeless tobacco: Never Used  . Tobacco comment: smokes approximately 5 cigarettes a day  Substance Use Topics  . Alcohol use: No    Comment: occasional    FAMILY HISTORY:  Family History  Problem Relation Age of Onset  . Cancer Mother   . COPD Father   . Asthma Son   . Prostate cancer Neg Hx   . Kidney cancer Neg Hx   . Bladder Cancer Neg Hx     DRUG ALLERGIES:  Allergies  Allergen Reactions  . Oxycodone-Acetaminophen Other (See Comments)  Hallucinations  . Valium [Diazepam]     REVIEW OF SYSTEMS:   CONSTITUTIONAL: No fever, but positive for severe fatigue and generalized weakness.  EYES: No changes in vision.  EARS, NOSE, AND THROAT: No tinnitus or ear pain.  RESPIRATORY: Positive for chronic cough and shortness of breath, secondary to COPD.  Currently, denies wheezing or hemoptysis.  CARDIOVASCULAR: No chest pain, orthopnea, edema.  GASTROINTESTINAL: No nausea, vomiting, diarrhea or  abdominal pain.  GENITOURINARY: No dysuria, hematuria.  ENDOCRINE: Positive for recent weight loss.  No polyuria, nocturia. HEMATOLOGY: No bleeding. SKIN: No rash or lesion. MUSCULOSKELETAL: No joint pain at this time.   NEUROLOGIC: No focal weakness.  PSYCHIATRY: No anxiety or depression.   MEDICATIONS AT HOME:  Prior to Admission medications   Medication Sig Start Date End Date Taking? Authorizing Provider  acetaminophen (TYLENOL) 650 MG CR tablet Take 650 mg by mouth 3 (three) times daily.    [provider]  albuterol (PROVENTIL HFA;VENTOLIN HFA) 108 (90 Base) MCG/ACT inhaler Inhale 2 puffs into the lungs every 6 (six) hours as needed for wheezing or shortness of breath. Patient not taking: Reported on 01/09/2017 04/21/16   Viviana Simpler I, MD  amLODipine (NORVASC) 2.5 MG tablet Take 1 tablet (2.5 mg total) by mouth daily. Patient not taking: Reported on 01/09/2017 05/24/16   Viviana Simpler I, MD  lidocaine (LIDODERM) 5 % Place 1 patch onto the skin every 12 (twelve) hours. Remove & Discard patch within 12 hours or as directed by MD Patient not taking: Reported on 11/21/2017 01/09/17 01/09/18  Merlyn Lot, MD  metoprolol tartrate (LOPRESSOR) 25 MG tablet Take 1 tablet (25 mg total) by mouth 2 (two) times daily. Patient not taking: Reported on 01/09/2017 05/17/16   Venia Carbon, MD  omeprazole (PRILOSEC) 40 MG capsule Take 1 capsule (40 mg total) by mouth daily. Patient not taking: Reported on 01/09/2017 05/23/16   Lucilla Lame, MD  oxyCODONE (ROXICODONE) 5 MG immediate release tablet Take 0.5 tablets (2.5 mg total) by mouth every 8 (eight) hours as needed for severe pain. Patient not taking: Reported on 11/21/2017 01/09/17 01/09/18  Rudene Re, MD      PHYSICAL EXAMINATION:   VITAL SIGNS: Blood pressure (!) 154/71, pulse 90, temperature 98.2 F (36.8 C), temperature source Oral, resp. rate (!) 23, height 4\' 11"  (1.499 m), weight 40.8 kg, SpO2 94  %.  GENERAL:  72 y.o.-year-old patient lying in the bed with no acute distress.  She looks cachectic, frail and unkept. EYES: Pupils equal, round, reactive to light and accommodation. No scleral icterus. Extraocular muscles intact.  HEENT: Head atraumatic, normocephalic. Oropharynx and nasopharynx clear.  NECK:  Supple, no jugular venous distention. No thyroid enlargement, no tenderness.  LUNGS: Reduced breath sounds bilaterally, no wheezing, rales,rhonchi or crepitation. No use of accessory muscles of respiration.  CARDIOVASCULAR: S1, S2 normal. No S3/S4.  ABDOMEN: Soft, nontender, nondistended. Bowel sounds present. No organomegaly or mass.  EXTREMITIES: No pedal edema, cyanosis, or clubbing.  NEUROLOGIC: No focal weakness.   PSYCHIATRIC: The patient is alert and oriented x 3.  SKIN: No obvious rash, lesion.   LABORATORY PANEL:   CBC Recent Labs  Lab 11/21/17 1730  WBC 6.0  HGB 11.7*  HCT 37.0  PLT 264  MCV 92.7  MCH 29.3  MCHC 31.6  RDW 14.9  LYMPHSABS 1.2  MONOABS 0.5  EOSABS 0.0  BASOSABS 0.0   ------------------------------------------------------------------------------------------------------------------  Chemistries  Recent Labs  Lab 11/21/17 1806  NA 140  K 3.0*  CL 100  CO2 31  GLUCOSE 180*  BUN 23  CREATININE 0.62  CALCIUM 8.5*  AST 34  ALT 16  ALKPHOS 121  BILITOT 0.6   ------------------------------------------------------------------------------------------------------------------ estimated creatinine clearance is 41.5 mL/min (by C-G formula based on SCr of 0.62 mg/dL). ------------------------------------------------------------------------------------------------------------------ No results for input(s): TSH, T4TOTAL, T3FREE, THYROIDAB in the last 72 hours.  Invalid input(s): FREET3   Coagulation profile No results for input(s): INR, PROTIME in the last 168  hours. ------------------------------------------------------------------------------------------------------------------- No results for input(s): DDIMER in the last 72 hours. -------------------------------------------------------------------------------------------------------------------  Cardiac Enzymes No results for input(s): CKMB, TROPONINI, MYOGLOBIN in the last 168 hours.  Invalid input(s): CK ------------------------------------------------------------------------------------------------------------------ Invalid input(s): POCBNP  ---------------------------------------------------------------------------------------------------------------  Urinalysis    Component Value Date/Time   COLORURINE YELLOW (A) 02/21/2016 1449   APPEARANCEUR CLEAR (A) 02/21/2016 1449   LABSPEC 1.014 02/21/2016 1449   PHURINE 5.0 02/21/2016 1449   GLUCOSEU NEGATIVE 02/21/2016 1449   HGBUR NEGATIVE 02/21/2016 1449   BILIRUBINUR NEGATIVE 02/21/2016 1449   KETONESUR NEGATIVE 02/21/2016 1449   PROTEINUR NEGATIVE 02/21/2016 1449   NITRITE NEGATIVE 02/21/2016 1449   LEUKOCYTESUR SMALL (A) 02/21/2016 1449     RADIOLOGY: Ct Chest W Contrast  Result Date: 11/21/2017 CLINICAL DATA:  72 year old female with right upper lobe nodule on portable chest radiograph earlier today. Weakness. EXAM: CT CHEST WITH CONTRAST TECHNIQUE: Multidetector CT imaging of the chest was performed during intravenous contrast administration. CONTRAST:  60mL OMNIPAQUE IOHEXOL 300 MG/ML  SOLN COMPARISON:  Portable chest 1743 hours today.  Chest CT 02/21/2016. FINDINGS: Cardiovascular: Cardiac size is at the upper limits of normal. No pericardial effusion. Ectatic thoracic aorta with soft and calcified atherosclerosis. Calcified coronary artery atherosclerosis. Mediastinum/Nodes: Bulky superior mediastinal lymphadenopathy measuring 27 millimeter short axis (38 millimeters long axis) with smaller abnormal right paratracheal nodes.  Mildly enlarged right hilar lymph node measuring 12 millimeters. No left hilar lymphadenopathy. Small gastric hiatal hernia. Lungs/Pleura: Chronic emphysema.  The major airways are patent. There are several right upper lobe lung nodules anteriorly. Each of the nodules appears somewhat spiculated and has a morphology suggesting extension along the adjacent airways (coronal image 40. The largest lesion individually is up to 24 millimeters. Some of these appear to extend to the pleural surface. These are new since 2018. No associated pleural effusion or other new pulmonary nodule or opacity. Upper Abdomen: Cachectic. Stable visible upper abdominal viscera including prior cholecystectomy, left nephrolithiasis. Musculoskeletal: Chronic compression fractures of T2, T5, T7, T8, T10, T11, T12 and L1 appear stable since 2018. Interval moderate to severe T3 compression. Chronic but increased sternal fractures since 2018. No destructive osseous lesion identified. IMPRESSION: 1. Cachectic with Right Upper Lobe Lung Cancer suspected: several new spiculated upper lobe lung nodules since 2018 are associated with new and bulky superior mediastinal lymphadenopathy (nodes up to 3.8 cm). 2. Underlying Emphysema (ICD10-J43.9). Ectatic aorta with Atherosclerosis (ICD10-I70.0). 3. Increased since 2018 chronic spinal compression and sternal fractures which are probably benign/osteoporotic. No destructive osseous lesion is identified. Electronically Signed   By: Genevie Ann M.D.   On: 11/21/2017 20:27   Dg Chest Portable 1 View  Result Date: 11/21/2017 CLINICAL DATA:  Weakness. EXAM: PORTABLE CHEST 1 VIEW COMPARISON:  01/09/2017, 02/21/2016 and chest CT 02/21/2016 FINDINGS: Lungs are adequately inflated without focal airspace consolidation or effusion. 1.4 cm nodular density over the right apex projecting over the anterior right first rib end. Cardiac silhouette is within normal. Prominence of the right paratracheal soft tissues in the  region of the azygos vein.  Hiatal hernia. Remainder the exam is unchanged. IMPRESSION: No acute cardiopulmonary disease. 1.4 cm nodular density over the right apex. Prominent right paramediastinal density in the region of the azygos vein as adenopathy is possible. Recommend contrast-enhanced chest CT for further evaluation. Electronically Signed   By: Marin Olp M.D.   On: 11/21/2017 18:15    EKG: Orders placed or performed during the hospital encounter of 11/21/17  . ED EKG  . ED EKG  . EKG 12-Lead  . EKG 12-Lead    IMPRESSION AND PLAN:   1.  Right lung mass, newly diagnosed per chest CT scan.  Will consult pulmonary services for further evaluation and treatment.  2.  Severe malnutrition.  Will place dietary consult. 3.  COPD, without exacerbation, the new maintenance therapy.  Smoking cessation advised. 4.  Hypokalemia.  Will replace potassium per protocol. 5.  Tobacco abuse.  Smoking cessation was discussed with patient.  All the records are reviewed and case discussed with ED provider. Management plans discussed with the patient, who was in agreement.  CODE STATUS: Full Code Status History    Date Active Date Inactive Code Status Order ID Comments User Context   02/21/2016 2238 02/25/2016 1500 Full Code [id number]  Theodoro Grist, MD Inpatient   02/07/2016 1441 02/07/2016 2154 Full Code [id number]  Thornton Park, MD Inpatient   02/07/2016 0542 02/07/2016 1441 Full Code [id number]  Saundra Shelling, MD Inpatient       TOTAL TIME TAKING CARE OF THIS PATIENT: 50 minutes.    Amelia Jo M.D on 11/21/2017 at 11:16 PM  Between 7am to 6pm - Pager - [phone]  After 6pm go to [email or web address] - password [email or web address] [facility] [place] [facility]  [phone]  CC: Primary care physician; Venia Carbon, MD

## 2017-11-21 NOTE — ED Notes (Signed)
Report was given to the floor at this time.

## 2017-11-21 NOTE — ED Notes (Signed)
ED Provider at bedside. 

## 2017-11-21 NOTE — ED Notes (Signed)
Pt is AOx4, hypertensive, in bed with rails upx2 and on the monitor. Pt denies any pain at this time and does not show any signs of distress. Pt was hungry and was given a box lunch tray. We will continue to monitor the pt.

## 2017-11-21 NOTE — ED Provider Notes (Signed)
[facility] Emergency Department Provider Note ____________________________________________   First MD Initiated Contact with Patient 11/21/17 1715     (approximate)  I have reviewed the triage vital signs and the nursing notes.   HISTORY  Chief Complaint Weight Loss (Faliure to Thrive )    HPI Melissa Watkins is a 72 y.o. female with PMH as noted below who presents for evaluation after a wellness check.  Per EMS, and neighbor called social services who did a welfare check with the patient's home and found it to be uninhabitable, contaminated with feces and insects.  Patient states that she lives with her son.  She states that she has had decreased appetite and difficulty eating intermittently over at least the last few months.  She reports a history of multiple falls although no recent falls or injuries.  She denies any acute medical complaints at this time.  Past Medical History:  Diagnosis Date  . Back pain, chronic   . COPD (chronic obstructive pulmonary disease) ([place])   . Duodenal ulcer 01/2016   and esophagitis (hospitalized)  . Essential hypertension, benign   . Essential hypertension, benign   . Osteoarthritis   . Osteoporosis     Patient Active Problem List   Diagnosis Date Noted  . Mass of right lung 11/21/2017  . Malnutrition of mild degree ([place]) 05/24/2016  . COPD (chronic obstructive pulmonary disease) ([place])   . Essential hypertension, benign   . Osteoporosis   . Osteoarthritis   . Acute bronchitis 02/25/2016  . COPD exacerbation ([place]) 02/25/2016  . Bilateral hydronephrosis 02/25/2016  . Bladder outlet obstruction 02/25/2016  . Duodenal ulcer 02/25/2016  . Acute esophagitis 02/25/2016  . Palpitations 07/17/2013    Past Surgical History:  Procedure Laterality Date  . ABDOMINAL HYSTERECTOMY    . CHOLECYSTECTOMY    . COLONOSCOPY    . ESOPHAGOGASTRODUODENOSCOPY (EGD) WITH PROPOFOL N/A 02/24/2016   Procedure:  ESOPHAGOGASTRODUODENOSCOPY (EGD) WITH PROPOFOL;  Surgeon: Jonathon Bellows, MD;  Location: [facility] ENDOSCOPY;  Service: Endoscopy;  Laterality: N/A;  . ESOPHAGOGASTRODUODENOSCOPY (EGD) WITH PROPOFOL N/A 06/08/2016   Procedure: ESOPHAGOGASTRODUODENOSCOPY (EGD) WITH PROPOFOL;  Surgeon: Jonathon Bellows, MD;  Location: [facility] ENDOSCOPY;  Service: Endoscopy;  Laterality: N/A;  . HIP ARTHROPLASTY Right 02/07/2016   Procedure: ARTHROPLASTY BIPOLAR HIP (HEMIARTHROPLASTY);  Surgeon: Thornton Park, MD;  Location: [facility] ORS;  Service: Orthopedics;  Laterality: Right;  . HIP FRACTURE SURGERY Right 01/2016  . OOPHORECTOMY      Prior to Admission medications   Medication Sig Start Date End Date Taking? Authorizing Provider  acetaminophen (TYLENOL) 650 MG CR tablet Take 650 mg by mouth 3 (three) times daily.    [provider]  albuterol (PROVENTIL HFA;VENTOLIN HFA) 108 (90 Base) MCG/ACT inhaler Inhale 2 puffs into the lungs every 6 (six) hours as needed for wheezing or shortness of breath. Patient not taking: Reported on 01/09/2017 04/21/16   Viviana Simpler I, MD  amLODipine (NORVASC) 2.5 MG tablet Take 1 tablet (2.5 mg total) by mouth daily. Patient not taking: Reported on 01/09/2017 05/24/16   Viviana Simpler I, MD  lidocaine (LIDODERM) 5 % Place 1 patch onto the skin every 12 (twelve) hours. Remove & Discard patch within 12 hours or as directed by MD Patient not taking: Reported on 11/21/2017 01/09/17 01/09/18  Merlyn Lot, MD  metoprolol tartrate (LOPRESSOR) 25 MG tablet Take 1 tablet (25 mg total) by mouth 2 (two) times daily. Patient not taking: Reported on 01/09/2017 05/17/16   Venia Carbon, MD  omeprazole (PRILOSEC) 40 MG capsule Take 1 capsule (40 mg total) by mouth daily. Patient not taking: Reported on 01/09/2017 05/23/16   Lucilla Lame, MD  oxyCODONE (ROXICODONE) 5 MG immediate release tablet Take 0.5 tablets (2.5 mg total) by mouth every 8 (eight) hours as needed for severe pain. Patient not  taking: Reported on 11/21/2017 01/09/17 01/09/18  Rudene Re, MD    Allergies Oxycodone-acetaminophen and Valium [diazepam]  Family History  Problem Relation Age of Onset  . Cancer Mother   . COPD Father   . Asthma Son   . Prostate cancer Neg Hx   . Kidney cancer Neg Hx   . Bladder Cancer Neg Hx     Social History Social History   Tobacco Use  . Smoking status: Current Every Day Smoker    Packs/day: 1.00    Years: 50.00    Pack years: 50.00    Types: Cigarettes    Last attempt to quit: 05/17/2016    Years since quitting: 1.5  . Smokeless tobacco: Never Used  . Tobacco comment: smokes approximately 5 cigarettes a day  Substance Use Topics  . Alcohol use: No    Comment: occasional  . Drug use: No    Review of Systems  Constitutional: No fever. Eyes: No redness. ENT: No sore throat. Cardiovascular: Denies chest pain. Respiratory: Denies shortness of breath. Gastrointestinal: No vomiting or diarrhea.  Genitourinary: Negative for dysuria.  Musculoskeletal: Negative for back pain. Skin: Negative for rash. Neurological: Negative for headache.   ____________________________________________   PHYSICAL EXAM:  VITAL SIGNS: ED Triage Vitals  Enc Vitals Group     BP 11/21/17 1700 (!) 184/87     Pulse Rate 11/21/17 1700 90     Resp 11/21/17 1700 20     Temp 11/21/17 1700 98.2 F (36.8 C)     Temp Source 11/21/17 1700 Oral     SpO2 11/21/17 1700 94 %     Weight 11/21/17 1707 90 lb (40.8 kg)     Height 11/21/17 1707 4\' 11"  (1.499 m)     Head Circumference --      Peak Flow --      Pain Score 11/21/17 1707 0     Pain Loc --      Pain Edu? --      Excl. in [place]? --     Constitutional: Alert and oriented.  Extremely frail and weak appearing.  Cachectic.  No acute distress. Eyes: Conjunctivae are normal.  EOMI.   Head: Atraumatic. Nose: No congestion/rhinnorhea. Mouth/Throat: Mucous membranes are dry.   Neck: Normal range of motion.  Cardiovascular:  Normal rate, regular rhythm. Grossly normal heart sounds.  Good peripheral circulation. Respiratory: Normal respiratory effort.  No retractions. Lungs CTAB. Gastrointestinal: Soft and nontender. No distention.  Genitourinary: No flank tenderness. Musculoskeletal: No lower extremity edema.  Extremities warm and well perfused.  Neurologic:  Normal speech and language.  Motor intact in all extremities.  No gross focal neurologic deficits are appreciated.  Skin:  Skin is warm and dry. No rash noted. Psychiatric: Mood and affect are normal. Speech and behavior are normal.  ____________________________________________   LABS (all labs ordered are listed, but only abnormal results are displayed)  Labs Reviewed  CBC WITH DIFFERENTIAL/PLATELET - Abnormal; Notable for the following components:      Result Value   Hemoglobin 11.7 (*)    All other components within normal limits  CK - Abnormal; Notable for the following components:   Total CK 19 (*)  All other components within normal limits  COMPREHENSIVE METABOLIC PANEL - Abnormal; Notable for the following components:   Potassium 3.0 (*)    Glucose, Bld 180 (*)    Calcium 8.5 (*)    Total Protein 6.1 (*)    Albumin 2.5 (*)    All other components within normal limits  URINALYSIS, COMPLETE (UACMP) WITH MICROSCOPIC  BASIC METABOLIC PANEL  CBC   ____________________________________________  EKG  ED ECG REPORT I, Arta Silence, the attending physician, personally viewed and interpreted this ECG.  Date: 11/21/2017 EKG Time: 1728 Rate: 95 Rhythm: normal sinus rhythm QRS Axis: normal Intervals: normal ST/T Wave abnormalities: Borderline T wave abnormalities laterally Narrative Interpretation: no evidence of acute ischemia  ____________________________________________  RADIOLOGY  CXR: Nonspecific right apical and paramediastinal findings CT chest: Right lung  mass.  ____________________________________________   PROCEDURES  Procedure(s) performed: No  Procedures  Critical Care performed: No ____________________________________________   INITIAL IMPRESSION / ASSESSMENT AND PLAN / ED COURSE  Pertinent labs & imaging results that were available during my care of the patient were reviewed by me and considered in my medical decision making (see chart for details).  72 year old female with PMH as noted above presents for evaluation after social service was this was called to do a wellness check, the patient was found to be in a residence in very poor condition, covered in feces and insects.  On exam, the vital signs are normal except for hypertension.  The patient is cachectic and frail-appearing.  However she does not appear acutely toxic.  The remainder of the exam is as described above.  I reviewed the past medical records in Epic; the patient was most recently here in December of last year for an evaluation of chest pain after mechanical fall.  At this time the patient has no acute medical complaints, except she reports chronic intermittent difficulty swallowing and decreased appetite.  I suspect that her issues are overall chronic.  Per the information given from EMS and social services, the patient is not safe to go back home.  We will obtain chest x-ray, UA, and lab work-up for medical clearance.  If there is an acute medical issue that requires admission we will do so.  If the patient does not warrant admission she will need PT and social work evaluation for disposition.  ----------------------------------------- 12:27 AM on 11/22/2017 -----------------------------------------  Chest x-ray showed some nonspecific findings in the right lung which were concerning for malignancy.  I obtained a CT chest based on radiology recommendations, which confirms a right lung mass.  I counseled the patient on the presence of the mass and the concern  for malignancy.  She expressed understanding.  Although initially the plan was for medical clearance and then disposition per social work, since the patient does have an acute medical issue and I do not think she has the ability currently to be safely discharged and seek outpatient follow-up, I will admit her for further work-up and medical stabilization before her social situation can be further evaluated and she can be discharged somewhere safely.  The patient agrees with admission to the hospital.  I signed the patient out to the hospitalist Dr. Duane Boston. ____________________________________________   FINAL CLINICAL IMPRESSION(S) / ED DIAGNOSES  Final diagnoses:  Lung mass  Hypokalemia  Dehydration      NEW MEDICATIONS STARTED DURING THIS VISIT:  Current Discharge Medication List       Note:  This document was prepared using Dragon voice recognition software and may  include unintentional dictation errors.    Arta Silence, MD 11/22/17 [id number]

## 2017-11-21 NOTE — ED Notes (Signed)
Pt going to medical imaging.

## 2017-11-22 ENCOUNTER — Telehealth: Payer: Self-pay | Admitting: Internal Medicine

## 2017-11-22 DIAGNOSIS — R64 Cachexia: Secondary | ICD-10-CM

## 2017-11-22 DIAGNOSIS — J449 Chronic obstructive pulmonary disease, unspecified: Secondary | ICD-10-CM

## 2017-11-22 DIAGNOSIS — R918 Other nonspecific abnormal finding of lung field: Secondary | ICD-10-CM

## 2017-11-22 DIAGNOSIS — L8990 Pressure ulcer of unspecified site, unspecified stage: Secondary | ICD-10-CM

## 2017-11-22 DIAGNOSIS — Z7189 Other specified counseling: Secondary | ICD-10-CM

## 2017-11-22 DIAGNOSIS — Z66 Do not resuscitate: Secondary | ICD-10-CM

## 2017-11-22 DIAGNOSIS — Z681 Body mass index (BMI) 19 or less, adult: Secondary | ICD-10-CM

## 2017-11-22 DIAGNOSIS — E43 Unspecified severe protein-calorie malnutrition: Secondary | ICD-10-CM

## 2017-11-22 DIAGNOSIS — Z515 Encounter for palliative care: Secondary | ICD-10-CM

## 2017-11-22 DIAGNOSIS — F17210 Nicotine dependence, cigarettes, uncomplicated: Secondary | ICD-10-CM

## 2017-11-22 DIAGNOSIS — Z591 Inadequate housing: Secondary | ICD-10-CM

## 2017-11-22 LAB — CBC
HCT: 33.4 % — ABNORMAL LOW (ref 36.0–46.0)
Hemoglobin: 10.9 g/dL — ABNORMAL LOW (ref 12.0–15.0)
MCH: 29.5 pg (ref 26.0–34.0)
MCHC: 32.6 g/dL (ref 30.0–36.0)
MCV: 90.5 fL (ref 80.0–100.0)
Platelets: 267 10*3/uL (ref 150–400)
RBC: 3.69 MIL/uL — ABNORMAL LOW (ref 3.87–5.11)
RDW: 14.7 % (ref 11.5–15.5)
WBC: 5.6 10*3/uL (ref 4.0–10.5)
nRBC: 0 % (ref 0.0–0.2)

## 2017-11-22 LAB — POTASSIUM: Potassium: 3.6 mmol/L (ref 3.5–5.1)

## 2017-11-22 LAB — BASIC METABOLIC PANEL
Anion gap: 6 (ref 5–15)
BUN: 17 mg/dL (ref 8–23)
CALCIUM: 7.7 mg/dL — AB (ref 8.9–10.3)
CO2: 31 mmol/L (ref 22–32)
Chloride: 102 mmol/L (ref 98–111)
Creatinine, Ser: 0.67 mg/dL (ref 0.44–1.00)
GFR calc Af Amer: >60 mL/min (ref >60)
GLUCOSE: 77 mg/dL (ref 70–99)
POTASSIUM: 2.4 mmol/L — AB (ref 3.5–5.1)
Sodium: 139 mmol/L (ref 135–145)

## 2017-11-22 LAB — MAGNESIUM: MAGNESIUM: 1.6 mg/dL — AB (ref 1.7–2.4)

## 2017-11-22 LAB — PHOSPHORUS: Phosphorus: 2.7 mg/dL (ref 2.5–4.6)

## 2017-11-22 LAB — GLUCOSE, CAPILLARY: Glucose-Capillary: 78 mg/dL (ref 70–99)

## 2017-11-22 MED ORDER — HYDRALAZINE HCL 20 MG/ML IJ SOLN
10.0000 mg | Freq: Four times a day (QID) | INTRAMUSCULAR | Status: DC | PRN
  Administered 2017-11-22: 10 mg via INTRAVENOUS
  Filled 2017-11-22: qty 1

## 2017-11-22 MED ORDER — ADULT MULTIVITAMIN W/MINERALS CH
1.0000 | ORAL_TABLET | Freq: Every day | ORAL | Status: DC
  Administered 2017-11-22 – 2017-11-24 (×3): 1 via ORAL
  Filled 2017-11-22 (×3): qty 1

## 2017-11-22 MED ORDER — ALBUTEROL SULFATE (2.5 MG/3ML) 0.083% IN NEBU: 2.5000 mg | INHALATION_SOLUTION | Freq: Four times a day (QID) | RESPIRATORY_TRACT | Status: DC | PRN

## 2017-11-22 MED ORDER — HYDROCODONE-ACETAMINOPHEN 5-325 MG PO TABS
1.0000 | ORAL_TABLET | ORAL | Status: DC | PRN
  Administered 2017-11-22: 1 via ORAL
  Filled 2017-11-22: qty 1

## 2017-11-22 MED ORDER — PANTOPRAZOLE SODIUM 40 MG PO TBEC
40.0000 mg | DELAYED_RELEASE_TABLET | Freq: Every day | ORAL | Status: DC
  Administered 2017-11-22 – 2017-11-24 (×3): 40 mg via ORAL
  Filled 2017-11-22 (×3): qty 1

## 2017-11-22 MED ORDER — AMLODIPINE BESYLATE 5 MG PO TABS
5.0000 mg | ORAL_TABLET | Freq: Every day | ORAL | Status: DC
  Administered 2017-11-23 – 2017-11-24 (×2): 5 mg via ORAL
  Filled 2017-11-22 (×2): qty 1

## 2017-11-22 MED ORDER — ENSURE ENLIVE PO LIQD
237.0000 mL | Freq: Two times a day (BID) | ORAL | Status: DC
  Administered 2017-11-22 – 2017-11-24 (×2): 237 mL via ORAL

## 2017-11-22 MED ORDER — ONDANSETRON HCL 4 MG/2ML IJ SOLN: 4.0000 mg | Freq: Four times a day (QID) | INTRAMUSCULAR | Status: DC | PRN

## 2017-11-22 MED ORDER — ACETAMINOPHEN 325 MG PO TABS
650.0000 mg | ORAL_TABLET | Freq: Three times a day (TID) | ORAL | Status: DC
  Administered 2017-11-22 – 2017-11-24 (×6): 650 mg via ORAL
  Filled 2017-11-22 (×6): qty 2

## 2017-11-22 MED ORDER — ONDANSETRON HCL 4 MG PO TABS: 4.0000 mg | ORAL_TABLET | Freq: Four times a day (QID) | ORAL | Status: DC | PRN

## 2017-11-22 MED ORDER — INFLUENZA VAC SPLIT HIGH-DOSE 0.5 ML IM SUSY
0.5000 mL | PREFILLED_SYRINGE | INTRAMUSCULAR | Status: DC
  Filled 2017-11-22: qty 0.5

## 2017-11-22 MED ORDER — BISACODYL 5 MG PO TBEC: 5.0000 mg | DELAYED_RELEASE_TABLET | Freq: Every day | ORAL | Status: DC | PRN

## 2017-11-22 MED ORDER — NICOTINE 21 MG/24HR TD PT24
21.0000 mg | MEDICATED_PATCH | Freq: Every day | TRANSDERMAL | Status: DC
  Administered 2017-11-22 – 2017-11-24 (×3): 21 mg via TRANSDERMAL
  Filled 2017-11-22 (×3): qty 1

## 2017-11-22 MED ORDER — METOPROLOL TARTRATE 25 MG PO TABS
25.0000 mg | ORAL_TABLET | Freq: Two times a day (BID) | ORAL | Status: DC
  Administered 2017-11-22 – 2017-11-24 (×5): 25 mg via ORAL
  Filled 2017-11-22 (×5): qty 1

## 2017-11-22 MED ORDER — DOCUSATE SODIUM 100 MG PO CAPS
100.0000 mg | ORAL_CAPSULE | Freq: Two times a day (BID) | ORAL | Status: DC
  Administered 2017-11-22 – 2017-11-24 (×4): 100 mg via ORAL
  Filled 2017-11-22 (×4): qty 1

## 2017-11-22 MED ORDER — MAGNESIUM SULFATE 2 GM/50ML IV SOLN
2.0000 g | Freq: Once | INTRAVENOUS | Status: AC
  Administered 2017-11-22: 2 g via INTRAVENOUS
  Filled 2017-11-22: qty 50

## 2017-11-22 MED ORDER — MAGNESIUM SULFATE 2 GM/50ML IV SOLN: 2.0000 g | Freq: Once | INTRAVENOUS | Status: DC

## 2017-11-22 MED ORDER — ACETAMINOPHEN 325 MG PO TABS: 650.0000 mg | ORAL_TABLET | Freq: Four times a day (QID) | ORAL | Status: DC | PRN

## 2017-11-22 MED ORDER — LIDOCAINE 5 % EX PTCH
1.0000 | MEDICATED_PATCH | CUTANEOUS | Status: DC
  Administered 2017-11-22 – 2017-11-23 (×2): 1 via TRANSDERMAL
  Filled 2017-11-22 (×3): qty 1

## 2017-11-22 MED ORDER — PNEUMOCOCCAL VAC POLYVALENT 25 MCG/0.5ML IJ INJ
0.5000 mL | INJECTION | INTRAMUSCULAR | Status: AC
  Administered 2017-11-24: 0.5 mL via INTRAMUSCULAR
  Filled 2017-11-22 (×2): qty 0.5

## 2017-11-22 MED ORDER — NICOTINE POLACRILEX 2 MG MT GUM
2.0000 mg | CHEWING_GUM | OROMUCOSAL | Status: DC | PRN
  Filled 2017-11-22: qty 1

## 2017-11-22 MED ORDER — TRAZODONE HCL 50 MG PO TABS: 25.0000 mg | ORAL_TABLET | Freq: Every evening | ORAL | Status: DC | PRN

## 2017-11-22 MED ORDER — POTASSIUM CHLORIDE 10 MEQ/100ML IV SOLN
10.0000 meq | INTRAVENOUS | Status: AC
  Administered 2017-11-22 (×4): 10 meq via INTRAVENOUS
  Filled 2017-11-22 (×4): qty 100

## 2017-11-22 MED ORDER — ACETAMINOPHEN 650 MG RE SUPP: 650.0000 mg | Freq: Four times a day (QID) | RECTAL | Status: DC | PRN

## 2017-11-22 MED ORDER — AMLODIPINE BESYLATE 5 MG PO TABS
2.5000 mg | ORAL_TABLET | Freq: Every day | ORAL | Status: DC
  Administered 2017-11-22: 2.5 mg via ORAL
  Filled 2017-11-22: qty 1

## 2017-11-22 MED ORDER — SODIUM CHLORIDE 0.9 % IV SOLN
INTRAVENOUS | Status: DC
  Administered 2017-11-22 – 2017-11-24 (×5): via INTRAVENOUS

## 2017-11-22 MED ORDER — VITAMIN C 500 MG PO TABS
500.0000 mg | ORAL_TABLET | Freq: Two times a day (BID) | ORAL | Status: DC
  Administered 2017-11-22 – 2017-11-24 (×5): 500 mg via ORAL
  Filled 2017-11-22 (×5): qty 1

## 2017-11-22 MED ORDER — POTASSIUM CHLORIDE 10 MEQ/100ML IV SOLN
10.0000 meq | Freq: Once | INTRAVENOUS | Status: AC
  Administered 2017-11-22: 10 meq via INTRAVENOUS
  Filled 2017-11-22: qty 100

## 2017-11-22 NOTE — Progress Notes (Signed)
Family Meeting Note  Advance Directive:no  Today a meeting took place with the Patient.and son    The following clinical team members were present during this meeting:MD  The following were discussed:Patient's diagnosis: RUL mass hypokalemia, Patient's progosis: Unable to determine and Goals for treatment: Palliative Care/FULL CODE  Additional follow-up to be provided:leaning towards DNR  palliative care consult  Time spent during discussion:17 minutes  Tyrian Peart, MD

## 2017-11-22 NOTE — Consult Note (Signed)
Name: JERRIS KELTZ MRN: [id number] DOB: February 15, 1945     CONSULTATION DATE:  REFERRING MD :   CHIEF COMPLAINT:   STUDIES:     CT chest Independently reviewed by Me today Patient has a right upper lobe mass associated with right upper lobe nodules Positive for mediastinal adenopathy   HISTORY OF PRESENT ILLNESS: 72 year old female with extensive smoking history with COPD admitted for generalized weakness and drowsiness Patient was found in a uninhabitable living condition with feces and insects Has been having decreased appetite and weight loss over the last several months Frequent falls  Patient subsequently obtain CT of her chest which showed a right upper lobe mass with mediastinal adenopathy  She is very frail and weak She is alert and awake at this time I have explained to her the CT findings and she seems to understand She is NOT in resp distress  No acute SOB No NVD     PAST MEDICAL HISTORY :   has a past medical history of Back pain, chronic, COPD (chronic obstructive pulmonary disease) ([place]), Duodenal ulcer (01/2016), Essential hypertension, benign, Essential hypertension, benign, Osteoarthritis, and Osteoporosis.  has a past surgical history that includes Cholecystectomy; Oophorectomy; Colonoscopy; Hip Arthroplasty (Right, 02/07/2016); Esophagogastroduodenoscopy (egd) with propofol (N/A, 02/24/2016); Hip fracture surgery (Right, 01/2016); Abdominal hysterectomy; and Esophagogastroduodenoscopy (egd) with propofol (N/A, 06/08/2016). Prior to Admission medications   Medication Sig Start Date End Date Taking? Authorizing Provider  acetaminophen (TYLENOL) 650 MG CR tablet Take 650 mg by mouth 3 (three) times daily.    [provider]  albuterol (PROVENTIL HFA;VENTOLIN HFA) 108 (90 Base) MCG/ACT inhaler Inhale 2 puffs into the lungs every 6 (six) hours as needed for wheezing or shortness of breath. Patient not taking: Reported on 01/09/2017 04/21/16   Viviana Simpler I, MD  amLODipine (NORVASC) 2.5 MG tablet Take 1 tablet (2.5 mg total) by mouth daily. Patient not taking: Reported on 01/09/2017 05/24/16   Viviana Simpler I, MD  lidocaine (LIDODERM) 5 % Place 1 patch onto the skin every 12 (twelve) hours. Remove & Discard patch within 12 hours or as directed by MD Patient not taking: Reported on 11/21/2017 01/09/17 01/09/18  Merlyn Lot, MD  metoprolol tartrate (LOPRESSOR) 25 MG tablet Take 1 tablet (25 mg total) by mouth 2 (two) times daily. Patient not taking: Reported on 01/09/2017 05/17/16   Venia Carbon, MD  omeprazole (PRILOSEC) 40 MG capsule Take 1 capsule (40 mg total) by mouth daily. Patient not taking: Reported on 01/09/2017 05/23/16   Lucilla Lame, MD  oxyCODONE (ROXICODONE) 5 MG immediate release tablet Take 0.5 tablets (2.5 mg total) by mouth every 8 (eight) hours as needed for severe pain. Patient not taking: Reported on 11/21/2017 01/09/17 01/09/18  Rudene Re, MD   Allergies  Allergen Reactions  . Oxycodone-Acetaminophen Other (See Comments)    Hallucinations  . Valium [Diazepam]     FAMILY HISTORY:  family history includes Asthma in her son; COPD in her father; Cancer in her mother. SOCIAL HISTORY:  reports that she has been smoking cigarettes. She has a 50.00 pack-year smoking history. She has never used smokeless tobacco. She reports that she does not drink alcohol or use drugs.  REVIEW OF SYSTEMS:   Constitutional: Negative for fever, chills, weight loss, malaise/fatigue and diaphoresis.  HENT: Negative for hearing loss, ear pain, nosebleeds, congestion, sore throat, neck pain, tinnitus and ear discharge.   Eyes: Negative for blurred vision, double vision, photophobia, pain, discharge and redness.  Respiratory: Negative  for cough, hemoptysis, sputum production, shortness of breath, wheezing and stridor.   Cardiovascular: Negative for chest pain, palpitations, orthopnea, claudication, leg swelling and PND.    Gastrointestinal: Negative for heartburn, nausea, vomiting, abdominal pain, diarrhea, constipation, blood in stool and melena.  Genitourinary: Negative for dysuria, urgency, frequency, hematuria and flank pain.  Musculoskeletal: Negative for myalgias, back pain, joint pain and falls.  Skin: Negative for itching and rash.  Neurological: Negative for dizziness, tingling, tremors, sensory change, speech change, focal weakness, seizures, loss of consciousness, weakness and headaches.  Endo/Heme/Allergies: Negative for environmental allergies and polydipsia. Does not bruise/bleed easily.  ALL OTHER ROS ARE NEGATIVE    VITAL SIGNS: Temp:  [98 F (36.7 C)-98.5 F (36.9 C)] 98 F (36.7 C) (10/24 0410) Pulse Rate:  [69-90] 81 (10/24 0848) Resp:  [18-23] 18 (10/24 0848) BP: (154-184)/(66-95) 178/95 (10/24 0848) SpO2:  [94 %-100 %] 100 % (10/24 0848) Weight:  [29.4 kg-40.8 kg] 29.7 kg (10/24 0500)  Physical Examination:   GENERAL:NAD, no fevers, chills, no weakness no fatigue HEAD: Normocephalic, atraumatic.  EYES: Pupils equal, round, reactive to light. Extraocular muscles intact. No scleral icterus.  MOUTH: Moist mucosal membrane.   EAR, NOSE, THROAT: Clear without exudates. No external lesions.  NECK: Supple. No thyromegaly. No nodules. No JVD.  PULMONARY:CTA B/L no wheezes, no crackles, no rhonchi CARDIOVASCULAR: S1 and S2. Regular rate and rhythm. No murmurs, rubs, or gallops. No edema.  GASTROINTESTINAL: Soft, nontender, nondistended. No masses. Positive bowel sounds.  MUSCULOSKELETAL: No swelling, clubbing, or edema. Range of motion full in all extremities.  NEUROLOGIC: Cranial nerves II through XII are intact. No gross focal neurological deficits.  SKIN: No ulceration, lesions, rashes, or cyanosis. Skin warm and dry. Turgor intact.  PSYCHIATRIC: Mood, affect within normal limits. The patient is awake, alert and oriented x 3. Insight, judgment intact.      ASSESSMENT /  PLAN: 72 year old female with COPD extensive smoking history with pulmonary cachexia in the setting of a right upper lobe mass with right upper lobe nodules which are new based on CT scan findings which are strongly suggestive of primary lung cancer in the setting of decreased appetite and weight loss  At this time I would recommend optimization of COPD and follow-up as outpatient in her pulmonary office to set up navigation bronchoscopy and EBUS   Will contact Family for update  Eular Panek Patricia Pesa, M.D.  [facility]  Medical Director [place] Director [facility]

## 2017-11-22 NOTE — Progress Notes (Signed)
Per MD okay for RN to place pt order,

## 2017-11-22 NOTE — Progress Notes (Signed)
[facility] [place] NAME: Melissa Watkins    MR#:  [id number]  DATE OF BIRTH:  10-07-1945   SUBJECTIVE:   patient hard of hearing does not use hearing AIDS Still smokes  Poor appetite and weight loss  REVIEW OF SYSTEMS:    Review of Systems  Constitutional: Negative for fever, chills ++weight loss, generalized weakness HENT: Negative for ear pain, nosebleeds, congestion, facial swelling, rhinorrhea, neck pain, neck stiffness and ear discharge.   Respiratory: Negative for cough, shortness of breath, wheezing  Cardiovascular: Negative for chest pain, palpitations and leg swelling.  Gastrointestinal: Negative for heartburn, abdominal pain, vomiting, diarrhea or consitpation Genitourinary: Negative for dysuria, urgency, frequency, hematuria Musculoskeletal: Negative for back pain or joint pain Neurological: Negative for dizziness, seizures, syncope, focal weakness,  numbness and headaches.  Hematological: Does not bruise/bleed easily.  Psychiatric/Behavioral: Negative for hallucinations, confusion, dysphoric mood    Tolerating Diet: yes      DRUG ALLERGIES:   Allergies  Allergen Reactions  . Oxycodone-Acetaminophen Other (See Comments)    Hallucinations  . Valium [Diazepam]     VITALS:  Blood pressure (!) 178/95, pulse 81, temperature 98 F (36.7 C), resp. rate 18, height 4\' 11"  (1.499 m), weight 29.7 kg, SpO2 100 %.  PHYSICAL EXAMINATION:  Constitutional: Appears thin and frail malnourished HENT: Normocephalic. Marland Kitchen Oropharynx is clear and moist.  Eyes: Conjunctivae and EOM are normal. PERRLA, no scleral icterus.  Neck: Normal ROM. Neck supple. No JVD. No tracheal deviation. CVS: RRR, S1/S2 +, no murmurs, no gallops, no carotid bruit.  Pulmonary: Effort and breath sounds normal, no stridor, rhonchi, wheezes, rales.  Abdominal: Soft. BS +,  no distension, tenderness, rebound or guarding.  Musculoskeletal: Normal range of  motion. No edema and no tenderness.  Neuro: Alert. CN 2-12 grossly intact. No focal deficits. Skin: Skin is warm and dry. No rash noted. Psychiatric: Normal mood and affect.      LABORATORY PANEL:   CBC Recent Labs  Lab 11/22/17 0755  WBC 5.6  HGB 10.9*  HCT 33.4*  PLT 267   ------------------------------------------------------------------------------------------------------------------  Chemistries  Recent Labs  Lab 11/21/17 1806 11/22/17 0755  NA 140 139  K 3.0* 2.4*  CL 100 102  CO2 31 31  GLUCOSE 180* 77  BUN 23 17  CREATININE 0.62 0.67  CALCIUM 8.5* 7.7*  MG  --  1.6*  AST 34  --   ALT 16  --   ALKPHOS 121  --   BILITOT 0.6  --    ------------------------------------------------------------------------------------------------------------------  Cardiac Enzymes No results for input(s): TROPONINI in the last 168 hours. ------------------------------------------------------------------------------------------------------------------  RADIOLOGY:  Ct Chest W Contrast  Result Date: 11/21/2017 CLINICAL DATA:  72 year old female with right upper lobe nodule on portable chest radiograph earlier today. Weakness. EXAM: CT CHEST WITH CONTRAST TECHNIQUE: Multidetector CT imaging of the chest was performed during intravenous contrast administration. CONTRAST:  64mL OMNIPAQUE IOHEXOL 300 MG/ML  SOLN COMPARISON:  Portable chest 1743 hours today.  Chest CT 02/21/2016. FINDINGS: Cardiovascular: Cardiac size is at the upper limits of normal. No pericardial effusion. Ectatic thoracic aorta with soft and calcified atherosclerosis. Calcified coronary artery atherosclerosis. Mediastinum/Nodes: Bulky superior mediastinal lymphadenopathy measuring 27 millimeter short axis (38 millimeters long axis) with smaller abnormal right paratracheal nodes. Mildly enlarged right hilar lymph node measuring 12 millimeters. No left hilar lymphadenopathy. Small gastric hiatal hernia. Lungs/Pleura:  Chronic emphysema.  The major airways are patent. There are several right upper lobe lung nodules  anteriorly. Each of the nodules appears somewhat spiculated and has a morphology suggesting extension along the adjacent airways (coronal image 40. The largest lesion individually is up to 24 millimeters. Some of these appear to extend to the pleural surface. These are new since 2018. No associated pleural effusion or other new pulmonary nodule or opacity. Upper Abdomen: Cachectic. Stable visible upper abdominal viscera including prior cholecystectomy, left nephrolithiasis. Musculoskeletal: Chronic compression fractures of T2, T5, T7, T8, T10, T11, T12 and L1 appear stable since 2018. Interval moderate to severe T3 compression. Chronic but increased sternal fractures since 2018. No destructive osseous lesion identified. IMPRESSION: 1. Cachectic with Right Upper Lobe Lung Cancer suspected: several new spiculated upper lobe lung nodules since 2018 are associated with new and bulky superior mediastinal lymphadenopathy (nodes up to 3.8 cm). 2. Underlying Emphysema (ICD10-J43.9). Ectatic aorta with Atherosclerosis (ICD10-I70.0). 3. Increased since 2018 chronic spinal compression and sternal fractures which are probably benign/osteoporotic. No destructive osseous lesion is identified. Electronically Signed   By: Genevie Ann M.D.   On: 11/21/2017 20:27   Dg Chest Portable 1 View  Result Date: 11/21/2017 CLINICAL DATA:  Weakness. EXAM: PORTABLE CHEST 1 VIEW COMPARISON:  01/09/2017, 02/21/2016 and chest CT 02/21/2016 FINDINGS: Lungs are adequately inflated without focal airspace consolidation or effusion. 1.4 cm nodular density over the right apex projecting over the anterior right first rib end. Cardiac silhouette is within normal. Prominence of the right paratracheal soft tissues in the region of the azygos vein. Hiatal hernia. Remainder the exam is unchanged. IMPRESSION: No acute cardiopulmonary disease. 1.4 cm nodular  density over the right apex. Prominent right paramediastinal density in the region of the azygos vein as adenopathy is possible. Recommend contrast-enhanced chest CT for further evaluation. Electronically Signed   By: Marin Olp M.D.   On: 11/21/2017 18:15     ASSESSMENT AND PLAN:   72 year old female with tobacco dependence who presents to the ED with generalized weakness and found to have Right Upper Lobe Lung Cancer suspected on CT scan.   1. Right Upper Lobe Lung Cancer suspected: Patient will need lung biopsy. Pulmonary and Oncology evaluation requested  2. Tobacco dependence: Patient is encouraged to quit smoking. Counseling was provided for 4 minutes. Nicotine patch  3. Hypokalemia/ hypomagnesia from poor nutrition: Replace and recheck in am.  4. Weight loss with severe protein calorie malnutrition Dietary consult  Will need nutritional supplements  5. Essential JJK:[id number] metoprolol and increase dose of norvasc for better B control. 6. COPD: no signs of exacerbation.   Management plans discussed with the patient and son and they are in agreement.  CODE STATUS: FULL  TOTAL TIME TAKING CARE OF THIS PATIENT: 30 minutes.     POSSIBLE D/C 2 days, DEPENDING ON CLINICAL CONDITION.   Thayne Cindric M.D on 11/22/2017 at 10:56 AM  Between 7am to 6pm - Pager - [phone] After 6pm go to [email or web address] - password [email or web address] [facility]  Office  [phone]  CC: Primary care physician; Venia Carbon, MD  Note: This dictation was prepared with Dragon dictation along with smaller phrase technology. Any transcriptional errors that result from this process are unintentional.

## 2017-11-22 NOTE — Progress Notes (Signed)
PT Cancellation Note  Patient Details Name: Melissa Watkins MRN: [id number] DOB: 12-03-1945   Cancelled Treatment:    Reason Eval/Treat Not Completed: Medical issues which prohibited therapy Pt with lab values that are out of acceptable range for PT to work with.  Also currently having some elevated BP.  Spoke with nursing and we agreed that PT eval held today and to try back tomorrow.  Kreg Shropshire, DPT 11/22/2017, 11:46 AM

## 2017-11-22 NOTE — Telephone Encounter (Signed)
FYI- pt admitted for lung mass/LN- s/o lung cancer. Will need out pt follow in appx 1 week to discuss the options- Bx vs no Bx. Pt too frail/home bound for any chemo/RT.  Please schedule appt with me in 1 week post discharge/ NO labs

## 2017-11-22 NOTE — Consult Note (Signed)
Consultation Note Date: 11/22/2017   Patient Name: Melissa Watkins  DOB: 17-Nov-1945  MRN: [id number]  Age / Sex: 72 y.o., female  PCP: Venia Carbon, MD Referring Physician: Bettey Costa, MD  Reason for Consultation: Establishing goals of care  HPI/Patient Profile: 72 y.o. female  with past medical history of chronic back pain, COPD, HTN, osteoarthritis, osteoporosis, h/o compression fracture admitted on 11/21/2017 with failure to thrive after welfare visit initiated EMS. Found to have BMI down to 13.22 (she was 99 lbs/BMI 20 in January 2019) and CT lung found several new spiculated right upper lobe lung nodules with superior mediastinal lymphadenopathy.   Clinical Assessment and Goals of Care: I spoke with Melissa Watkins. She is very frail and elderly. She approached the subject of resuscitation and told me that she did NOT want resuscitation. She repeated this multiple times and this was witnessed by RN at bedside as Watkins. She tells me "when Jesus calls you, you have to go." She also expresses interest in pursuing biopsy but understands that she is poor candidate for any treatment such as chemotherapy and radiation given her frailty. She does not seem interested in aggressive interventions such as chemo/radiation. Will need to continue to discuss the risk vs benefit of biopsy if she does not desire treatment. Melissa Watkins appears to be completely alert and oriented and even asks her weight and gasps when she learns how little she weighs and shares she weighed 99 lbs at the beginning of the year (this was documented in last doctor visit).   She also is having trouble with swallowing and holds liquids in her mouth for a very long period of time. SLP at bedside and explains that this is compensatory likely from pressure of lung mass and recommending full liquids.   Melissa Watkins gives me permission to share our  discussion and her wishes with her son [name] or his wife Corporate treasurer. I asked if she has shared these wishes with [name] and she says "he doesn't listen, Melissa Watkins listens." I was unable to reach [place]. I did call and speak with her daughter-in-law, Melissa Watkins. Melissa Watkins says that her husband is a Administrator and is often driving or sleeping. I gave her my contact number to discuss the above. I shared with Beth my discussion with Melissa Watkins and that at this time we have to follow Melissa Watkins's wishes for DNR. I shared input from Dr. Mortimer Fries at this time and we discussed the risk vs benefit of biopsy needs to be considered. We are still awaiting input from oncology as Watkins to be able to move forward with making good decisions. Beth asks about prognosis and even hospice. I did explain that I believe that Melissa Watkins has poor prognosis and could be weeks to months potentially. I also believe she would be a good candidate for hospice. Most likely not yet eligible for hospice facility. Melissa Watkins explains that Melissa Watkins's daughter lives in [place] and that [name] is often away with work so they would be interested in prognosis.  Emotional support provided. I will await call to speak further with Melissa Watkins son, [name].   Primary Decision Maker PATIENT    SUMMARY OF RECOMMENDATIONS   - Patient clear about wishes for DNR - Discussed plan with daughter-in-law, await call back from son  Code Status/Advance Care Planning:  DNR   Symptom Management:   Dulcolax tablet daily.   Chronic back pain: Chronic compression fractures T2-L1.   Lidoderm patch to low back.   Recommend air mattress for skin protection.   Tylenol 650 mg TID.   Palliative Prophylaxis:   Aspiration, Bowel Regimen, Delirium Protocol, Frequent Pain Assessment, Palliative Wound Care and Turn Reposition  Psycho-social/Spiritual:   Desire for further Chaplaincy support:yes  Additional Recommendations: Caregiving  Support/Resources, Education  on Hospice and Grief/Bereavement Support  Prognosis:   TBD. I do believe she is < 6 months and hospice appropriate. Prognosis could be worse - will need to assess progress and status over next couple days.   Discharge Planning: To Be Determined      Primary Diagnoses: Present on Admission: . Mass of right lung   I have reviewed the medical record, interviewed the patient and family, and examined the patient. The following aspects are pertinent.  Past Medical History:  Diagnosis Date  . Back pain, chronic   . COPD (chronic obstructive pulmonary disease) ([place])   . Duodenal ulcer 01/2016   and esophagitis (hospitalized)  . Essential hypertension, benign   . Essential hypertension, benign   . Osteoarthritis   . Osteoporosis    Social History   Socioeconomic History  . Marital status: Widowed    Spouse name: Not on file  . Number of children: 4  . Years of education: Not on file  . Highest education level: Not on file  Occupational History  . 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 Financial resource strain: Not on file  . Food insecurity:    Worry: Not on file    Inability: Not on file  . Transportation needs:    Medical: Not on file    Non-medical: Not on file  Tobacco Use  . Smoking status: Current Every Day Smoker    Packs/day: 1.00    Years: 50.00    Pack years: 50.00    Types: Cigarettes    Last attempt to quit: 05/17/2016    Years since quitting: 1.5  . Smokeless tobacco: Never Used  . Tobacco comment: smokes approximately 5 cigarettes a day  Substance and Sexual Activity  . Alcohol use: No    Comment: occasional  . Drug use: No  . Sexual activity: Not on file  Lifestyle  . Physical activity:    Days per week: Not on file    Minutes per session: Not on file  . Stress: Not on file  Relationships  . Social connections:    Talks on phone: Not on file    Gets together: Not on file    Attends religious  service: Not on file    Active member of club or organization: Not on file    Attends meetings of clubs or organizations: Not on file    Relationship status: Not on file  Other Topics Concern  . Not on file  Social History Narrative   No living will   4 children (2 of each). Son lives with her   DNR done 02/25/16 ("if it is time to go, it is time to go")   Family  History  Problem Relation Age of Onset  . Cancer Mother   . COPD Father   . Asthma Son   . Prostate cancer Neg Hx   . Kidney cancer Neg Hx   . Bladder Cancer Neg Hx    Scheduled Meds: . [START ON 11/23/2017] amLODipine  5 mg Oral Daily  . docusate sodium  100 mg Oral BID  . feeding supplement (ENSURE ENLIVE)  237 mL Oral BID BM  . [START ON 11/23/2017] Influenza vac split quadrivalent PF  0.5 mL Intramuscular Tomorrow-1000  . metoprolol tartrate  25 mg Oral BID  . multivitamin with minerals  1 tablet Oral Daily  . nicotine  21 mg Transdermal Daily  . pantoprazole  40 mg Oral Daily  . [START ON 11/23/2017] pneumococcal 23 valent vaccine  0.5 mL Intramuscular Tomorrow-1000  . vitamin C  500 mg Oral BID   Continuous Infusions: . sodium chloride 75 mL/hr at 11/22/17 1209  . magnesium sulfate 1 - 4 g bolus IVPB    . potassium chloride 10 mEq (11/22/17 1209)   PRN Meds:.acetaminophen **OR** acetaminophen, albuterol, bisacodyl, hydrALAZINE, HYDROcodone-acetaminophen, nicotine polacrilex, ondansetron **OR** ondansetron (ZOFRAN) IV, traZODone Allergies  Allergen Reactions  . Oxycodone-Acetaminophen Other (See Comments)    Hallucinations  . Valium [Diazepam]    Review of Systems  Unable to perform ROS Constitutional: Positive for activity change, appetite change and fatigue.  Respiratory: Negative for shortness of breath.   Gastrointestinal: Positive for abdominal pain.  Neurological: Positive for weakness.    Physical Exam  Constitutional: She is oriented to person, place, and time. She appears cachectic. She has a  sickly appearance. She appears ill.  Extremely frail  Cardiovascular: Normal rate.  Pulmonary/Chest: Effort normal. No accessory muscle usage. No tachypnea. No respiratory distress.  Abdominal: Normal appearance.  Neurological: She is alert and oriented to person, place, and time.  Hard of hearing  Nursing note and vitals reviewed.   Vital Signs: BP (!) 184/79 (BP Location: Left Arm)   Pulse 60   Temp 98.6 F (37 C) (Oral)   Resp 19   Ht 4\' 11"  (1.499 m)   Wt 29.7 kg   SpO2 97%   BMI 13.22 kg/m  Pain Scale: 0-10   Pain Score: 7    SpO2: SpO2: 97 % O2 Device:SpO2: 97 % O2 Flow Rate: .   IO: Intake/output summary:   Intake/Output Summary (Last 24 hours) at 11/22/2017 1254 Last data filed at 11/22/2017 0300 Gross per 24 hour  Intake 309.23 ml  Output 350 ml  Net -40.77 ml    LBM: Last BM Date: 11/22/17 Baseline Weight: Weight: 40.8 kg Most recent weight: Weight: 29.7 kg     Palliative Assessment/Data: 30%      Time In: 1200 Time Out: 1315 Time Total: 75 min Greater than 50%  of this time was spent counseling and coordinating care related to the above assessment and plan.  Signed by: Vinie Sill, NP Palliative Medicine Team Pager # [phone] (M-F 8a-5p) Team Phone # [phone] (Nights/Weekends)

## 2017-11-22 NOTE — Progress Notes (Signed)
Palliative:  Full note to follow. I spoke with Ms. Baskins. She is very frail and elderly. She approached the subject of resuscitation and told me that she did NOT want resuscitation. She repeated this multiple times and this was witnessed by RN at bedside as well. She tells me "when Jesus calls you, you have to go." She also expresses interest in pursuing biopsy but understands that she is poor candidate for any treatment such as chemotherapy and radiation given her frailty. She does not seem interested in aggressive interventions such as chemo/radiation. Will need to continue to discuss the risk vs benefit of biopsy if she does not desire treatment.   She also is having trouble with swallowing and holds liquids in her mouth for a very long period of time. SLP at bedside and explains that this is compensatory likely from pressure of lung mass and recommending full liquids.   Ms. Zobrist gives me permission to share our discussion and her wishes with her son [name] or his wife Corporate treasurer. I asked if she has shared these wishes with [name] and she says "he doesn't listen, [name] listens." I was unable to reach [place]. I did call and speak with her daughter-in-law, [name]. [name] says that her husband is a Administrator and is often driving or sleeping. I gave her my contact number to discuss the above. I shared with Beth my discussion with Ms. Kochanski and that at this time we have to follow Ms. Strutz's wishes for DNR. I shared input from Dr. Mortimer Fries at this time and we discussed the risk vs benefit of biopsy needs to be considered. We are still awaiting input from oncology as well to be able to move forward with making good decisions.   Emotional support provided. I will await call to speak further with Ms. Littman son, Marshell Levan, NP Palliative Medicine Team Pager # [phone] (M-F 8a-5p) Team Phone # [phone] (Nights/Weekends)

## 2017-11-22 NOTE — Evaluation (Signed)
Clinical/Bedside Swallow Evaluation Patient Details  Name: Melissa Watkins MRN: [id number] Date of Birth: 1946-01-07  Today's Date: 11/22/2017 Time: SLP Start Time (ACUTE ONLY): 61 SLP Stop Time (ACUTE ONLY): 1340 SLP Time Calculation (min) (ACUTE ONLY): 60 min  Past Medical History:  Past Medical History:  Diagnosis Date  . Back pain, chronic   . COPD (chronic obstructive pulmonary disease) ([place])   . Duodenal ulcer 01/2016   and esophagitis (hospitalized)  . Essential hypertension, benign   . Essential hypertension, benign   . Osteoarthritis   . Osteoporosis    Past Surgical History:  Past Surgical History:  Procedure Laterality Date  . ABDOMINAL HYSTERECTOMY    . CHOLECYSTECTOMY    . COLONOSCOPY    . ESOPHAGOGASTRODUODENOSCOPY (EGD) WITH PROPOFOL N/A 02/24/2016   Procedure: ESOPHAGOGASTRODUODENOSCOPY (EGD) WITH PROPOFOL;  Surgeon: Jonathon Bellows, MD;  Location: [facility] ENDOSCOPY;  Service: Endoscopy;  Laterality: N/A;  . ESOPHAGOGASTRODUODENOSCOPY (EGD) WITH PROPOFOL N/A 06/08/2016   Procedure: ESOPHAGOGASTRODUODENOSCOPY (EGD) WITH PROPOFOL;  Surgeon: Jonathon Bellows, MD;  Location: [facility] ENDOSCOPY;  Service: Endoscopy;  Laterality: N/A;  . HIP ARTHROPLASTY Right 02/07/2016   Procedure: ARTHROPLASTY BIPOLAR HIP (HEMIARTHROPLASTY);  Surgeon: Thornton Park, MD;  Location: [facility] ORS;  Service: Orthopedics;  Laterality: Right;  . HIP FRACTURE SURGERY Right 01/2016  . OOPHORECTOMY     HPI:  Pt is a 72 year old female with PMH of COPD, duodenal ulcer presents for evaluation after social service was this was called to do a wellness check, the patient was found to be in a residence in very poor condition, covered in feces and insects. Pt found to have new lung mass. Chest CT revealed bulky superior mediastinal lymphadenopathy w/ abnormal right paratracheal nodes. Mildly enlarged right hilar lymph node. Pt has been having trouble with swallowing and holds liquids in her mouth for a very long period  of time. Pt also reports food getting stuck in her throat sometimes. Pt only ate bites of her breakfast this morning and reports vomiting or spitting up mucous with a piece of potato afterwards. 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 Per chart, pt has lost 26lbs(28%) wt loss over the past 10 months; this is severe per the Dietitian.    Assessment / Plan / Recommendation Clinical Impression  This was a brief assessment of pt's swallowing as pt only accepted a few po trials (liquids); no solid foods. With the trials, pt appears to present w/ adequate oral phase of swallowing w/ reduced suspicion of pharyngeal phase swallowing deficits, however, pt could have pharyngeal-esophageal phase deficits as evidenced by her long-standing reports of difficulty w/ oral intake and episodes of Regurgitation/Vomiting along w/ mucous. Unsure if this is related to pt's small gastric hiatal hernia or the newly dx'd lung cancer w/ abnormal right paratracheal nodes(could represent a compression issue). Pt exhibited oral holding w/ all trials accepted but suspect pt's oral holding was in preparation and readiness for swallowing; her uncertainty was apparent. She accepted 2 trials of sherbert then nothing further. Hard, loud swallows noted by no overt s/s of aspiration or decline in respiratory effort post trials; vocal quality was clear. No unilateral OM weakness was noted. Pt assisted in feeding self. Recommend a more modified diet to a liquid base - Full Liquids. Recommend general aspiration precautions and Pills Crushed in puree for safer swallowing, clearing. Palliative Care to meet further w/ family and pt for GOC. ST services will f/u w/ toleration of diet; education. Further assessment if  warranted.  SLP Visit Diagnosis: Dysphagia, pharyngoesophageal phase (R13.14)(suspected)    Aspiration Risk  Mild aspiration risk    Diet Recommendation  Full Liquid diet consistency  for easier swallowing, clearing. Aspiration precautions; setup and assistance at meals as needed. Reflux precautions.  Medication Administration: Crushed with puree    Other  Recommendations Recommended Consults: (Dietician f/u) Oral Care Recommendations: Oral care BID;Patient independent with oral care(assist) Other Recommendations: (n/a)   Follow up Recommendations (TBD)      Frequency and Duration min 2x/week  1 week       Prognosis Prognosis for Safe Diet Advancement: Guarded Barriers to Reach Goals: Severity of deficits      Swallow Study   General Date of Onset: 11/21/17 HPI: Pt is a 72 year old female with PMH of COPD, duodenal ulcer presents for evaluation after social service was this was called to do a wellness check, the patient was found to be in a residence in very poor condition, covered in feces and insects. Pt found to have new lung mass. Chest CT revealed bulky superior mediastinal lymphadenopathy w/ abnormal right paratracheal nodes. Mildly enlarged right hilar lymph node. Pt has been having trouble with swallowing and holds liquids in her mouth for a very long period of time. Pt also reports food getting stuck in her throat sometimes. Pt only ate bites of her breakfast this morning and reports vomiting or spitting up mucous with a piece of potato afterwards. 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 Per chart, pt has lost 26lbs(28%) wt loss over the past 10 months; this is severe per the Dietitian.  Type of Study: Bedside Swallow Evaluation Previous Swallow Assessment: none reported Diet Prior to this Study: Regular;Thin liquids(currently) Temperature Spikes Noted: No(wbc 5.6) Respiratory Status: Room air History of Recent Intubation: No Behavior/Cognition: Alert;Cooperative;Pleasant mood(HOH) Oral Cavity Assessment: (adequate) Oral Care Completed by SLP: Recent completion by staff Oral Cavity - Dentition: Missing  dentition Vision: Functional for self-feeding Self-Feeding Abilities: Able to feed self;Needs assist;Needs set up Patient Positioning: Postural control adequate for testing(legs drawn; cachectic appearing) Baseline Vocal Quality: Low vocal intensity Volitional Cough: (fair) Volitional Swallow: Able to elicit    Oral/Motor/Sensory Function Overall Oral Motor/Sensory Function: Within functional limits(grossly)   Ice Chips Ice chips: Not tested   Thin Liquid Thin Liquid: Impaired Presentation: Cup;Self Fed;Straw(4 trials total) Oral Phase Impairments: (none) Oral Phase Functional Implications: Oral holding(w/ all trials ) Pharyngeal  Phase Impairments: (hard, effortful swallows) Other Comments: suspect pt's oral holding was in preparation and readiness for swallowing; uncertainty was apparent    Nectar Thick Nectar Thick Liquid: Not tested   Honey Thick Honey Thick Liquid: Not tested   Puree Puree: Not tested Other Comments: pt declined trials    Solid     Solid: Not tested      Orinda Kenner, MS, CCC-SLP Watson,Katherine 11/22/2017,2:43 PM

## 2017-11-22 NOTE — Progress Notes (Signed)
Pharmacy Electrolyte Monitoring Consult:  Pharmacy consulted to assist in monitoring and replacing electrolytes in this 72 y.o. female admitted on 11/21/2017 with Weight Loss ([employer] to [employer]). She is likely at high refeeding risk, therefore pharmacy will monitor K, Mg and P labs daily until stable.    Labs:   Potassium (mmol/L)  Date Value  11/22/2017 3.6   Magnesium (mg/dL)  Date Value  11/22/2017 1.6 (L)   Phosphorus (mg/dL)  Date Value  11/22/2017 2.7   Calcium (mg/dL)  Date Value  11/22/2017 7.7 (L)   Albumin (g/dL)  Date Value  11/21/2017 2.5 (L)  ] Assessment/Plan: 10/24 0755 K 2.4 mmol/L, Mg 1.6mg /dL, phosphorous wnl, corrected calcium equals 8.9 mg/dL. Patient subsequently was administered KCl 10 mEq IV x 4 and 2 grams IV magnesium sulfate  10/24 1421 K 3.6 mmol/L: no further supplementation required  Will recheck electrolytes w/ am labs and replace if needed.  Vallery Sa, PharmD Clinical Pharmacist 11/22/2017

## 2017-11-22 NOTE — Consult Note (Signed)
[facility] Nurse wound consult note Reason for Consult:malnutrition with poor living conditions.  Moisture (incontinence) associated dermatitis to buttocks from exposure to feces.  Bilateral hips with deep tissue injury with nonblanchable maroon intact skin Wound type:pressure and moisture Pressure Injury POA: Yes Measurement:Bilateral hips:  2 cmx 2 cm intact maroon discoloration.  Scattered nonintact lesions to buttocks and perineal skin from exposure to incontinence.  Wound YHC:WCBJ and moist Drainage (amount, consistency, odor) minimal serosanguinous no odor Periwound:Dry skin, malnourished body habitus Dressing procedure/placement/frequency:Keep skin clean and dry.  Apply barrier cream to perineal skin.  Silicone foam dressing to bilateral hips to protect.  Encourage to turn and reposition every two hours.  Will not follow at this time.  Please re-consult if needed.  Domenic Moras MSN, RN, FNP-BC CWON Wound, Ostomy, Continence Nurse Pager [phone]

## 2017-11-22 NOTE — Consult Note (Signed)
[facility] NOTE  Patient Care Team: Venia Carbon, MD as PCP - General (Internal Medicine)  CHIEF COMPLAINTS/PURPOSE OF CONSULTATION: Lung mass  HISTORY OF PRESENTING ILLNESS: Patient is a poor historian/hard of hearing. Melissa Watkins 72 y.o.  female history of long-standing smoking is currently admitted to the hospital after patient was found to have been living in poor hygienic surroundings at home.  This was found at welfare check.   This patient was admitted to hospital for guilty breathing.  Further work-up showed patient to have right upper lobe lung nodules; bulky mediastinal adenopathy highly suspicious for malignancy.  Patient has been evaluated by pulmonary/plan outpatient. Patient states her breathing is improved since being in the hospital.   Positive for weight loss.  No headaches.  Chronic joint pains.  ROS: A complete 10 point review of system is done which is negative except mentioned above in history of present illness   MEDICAL HISTORY:  Past Medical History:  Diagnosis Date  . Back pain, chronic   . COPD (chronic obstructive pulmonary disease) ([place])   . Duodenal ulcer 01/2016   and esophagitis (hospitalized)  . Essential hypertension, benign   . Essential hypertension, benign   . Osteoarthritis   . Osteoporosis     SURGICAL HISTORY: Past Surgical History:  Procedure Laterality Date  . ABDOMINAL HYSTERECTOMY    . CHOLECYSTECTOMY    . COLONOSCOPY    . ESOPHAGOGASTRODUODENOSCOPY (EGD) WITH PROPOFOL N/A 02/24/2016   Procedure: ESOPHAGOGASTRODUODENOSCOPY (EGD) WITH PROPOFOL;  Surgeon: Jonathon Bellows, MD;  Location: [facility] ENDOSCOPY;  Service: Endoscopy;  Laterality: N/A;  . ESOPHAGOGASTRODUODENOSCOPY (EGD) WITH PROPOFOL N/A 06/08/2016   Procedure: ESOPHAGOGASTRODUODENOSCOPY (EGD) WITH PROPOFOL;  Surgeon: Jonathon Bellows, MD;  Location: [facility] ENDOSCOPY;  Service: Endoscopy;  Laterality: N/A;  . HIP ARTHROPLASTY Right 02/07/2016   Procedure:  ARTHROPLASTY BIPOLAR HIP (HEMIARTHROPLASTY);  Surgeon: Thornton Park, MD;  Location: [facility] ORS;  Service: Orthopedics;  Laterality: Right;  . HIP FRACTURE SURGERY Right 01/2016  . OOPHORECTOMY      SOCIAL HISTORY: Social History   Socioeconomic History  . Marital status: Widowed    Spouse name: Not on file  . Number of children: 4  . Years of education: Not on file  . Highest education level: Not on file  Occupational History  . 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 Financial resource strain: Not on file  . Food insecurity:    Worry: Not on file    Inability: Not on file  . Transportation needs:    Medical: Not on file    Non-medical: Not on file  Tobacco Use  . Smoking status: Current Every Day Smoker    Packs/day: 1.00    Years: 50.00    Pack years: 50.00    Types: Cigarettes    Last attempt to quit: 05/17/2016    Years since quitting: 1.5  . Smokeless tobacco: Never Used  . Tobacco comment: smokes approximately 5 cigarettes a day  Substance and Sexual Activity  . Alcohol use: No    Comment: occasional  . Drug use: No  . Sexual activity: Not on file  Lifestyle  . Physical activity:    Days per week: Not on file    Minutes per session: Not on file  . Stress: Not on file  Relationships  . Social connections:    Talks on phone: Not on file    Gets together: Not on file  Attends religious service: Not on file    Active member of club or organization: Not on file    Attends meetings of clubs or organizations: Not on file    Relationship status: Not on file  . Intimate partner violence:    Fear of current or ex partner: Not on file    Emotionally abused: Not on file    Physically abused: Not on file    Forced sexual activity: Not on file  Other Topics Concern  . Not on file  Social History Narrative   No living will   4 children (2 of each). Son lives with her   DNR done 02/25/16 ("if it is time to go, it is time  to go")    FAMILY HISTORY: Family History  Problem Relation Age of Onset  . Cancer Mother   . COPD Father   . Asthma Son   . Prostate cancer Neg Hx   . Kidney cancer Neg Hx   . Bladder Cancer Neg Hx     ALLERGIES:  is allergic to oxycodone-acetaminophen and valium [diazepam].  MEDICATIONS:  Current Facility-Administered Medications  Medication Dose Route Frequency Provider Last Rate Last Dose  . 0.9 %  sodium chloride infusion   Intravenous Continuous Amelia Jo, MD 75 mL/hr at 11/22/17 1544    . acetaminophen (TYLENOL) tablet 650 mg  650 mg Oral TID Pershing Proud, NP   244 mg at 11/22/17 1617  . albuterol (PROVENTIL) (2.5 MG/3ML) 0.083% nebulizer solution 2.5 mg  2.5 mg Inhalation Q6H PRN Amelia Jo, MD      . Derrill Memo ON 11/23/2017] amLODipine (NORVASC) tablet 5 mg  5 mg Oral Daily Mody, Sital, MD      . bisacodyl (DULCOLAX) EC tablet 5 mg  5 mg Oral Daily PRN Amelia Jo, MD      . docusate sodium (COLACE) capsule 100 mg  100 mg Oral BID Amelia Jo, MD   100 mg at 11/22/17 0851  . feeding supplement (ENSURE ENLIVE) (ENSURE ENLIVE) liquid 237 mL  237 mL Oral BID BM Mody, Sital, MD   237 mL at 11/22/17 1126  . hydrALAZINE (APRESOLINE) injection 10 mg  10 mg Intravenous Q6H PRN Bettey Costa, MD   10 mg at 11/22/17 1208  . HYDROcodone-acetaminophen (NORCO/VICODIN) 5-325 MG per tablet 1-2 tablet  1-2 tablet Oral Q4H PRN Amelia Jo, MD   1 tablet at 11/22/17 1244  . [START ON 11/23/2017] Influenza vac split quadrivalent PF (FLUZONE HIGH-DOSE) injection 0.5 mL  0.5 mL Intramuscular Tomorrow-1000 Amelia Jo, MD      . lidocaine (LIDODERM) 5 % 1 patch  1 patch Transdermal W10U Vinie Sill C, NP      . metoprolol tartrate (LOPRESSOR) tablet 25 mg  25 mg Oral BID Amelia Jo, MD   25 mg at 11/22/17 0851  . multivitamin with minerals tablet 1 tablet  1 tablet Oral Daily Bettey Costa, MD   1 tablet at 11/22/17 1125  . nicotine (NICODERM CQ - dosed in mg/24 hours) patch 21  mg  21 mg Transdermal Daily Mody, Sital, MD   21 mg at 11/22/17 1126  . nicotine polacrilex (NICORETTE) gum 2 mg  2 mg Oral PRN Bettey Costa, MD      . ondansetron (ZOFRAN) tablet 4 mg  4 mg Oral Q6H PRN Amelia Jo, MD       Or  . ondansetron [facility]) injection 4 mg  4 mg Intravenous Q6H PRN Amelia Jo, MD      .  pantoprazole (PROTONIX) EC tablet 40 mg  40 mg Oral Daily Amelia Jo, MD   40 mg at 11/22/17 0852  . [START ON 11/23/2017] pneumococcal 23 valent vaccine (PNU-IMMUNE) injection 0.5 mL  0.5 mL Intramuscular Tomorrow-1000 Amelia Jo, MD      . traZODone (DESYREL) tablet 25 mg  25 mg Oral QHS PRN Amelia Jo, MD      . vitamin C (ASCORBIC ACID) tablet 500 mg  500 mg Oral BID Bettey Costa, MD   500 mg at 11/22/17 1125      .  PHYSICAL EXAMINATION:  Vitals:   11/22/17 1142 11/22/17 1324  BP: (!) 184/79 (!) 107/47  Pulse: 60 87  Resp: 19   Temp: 98.6 F (37 C)   SpO2: 97% 97%   Filed Weights   11/21/17 1707 11/22/17 0030 11/22/17 0500  Weight: 90 lb (40.8 kg) 64 lb 13 oz (29.4 kg) 65 lb 7.6 oz (29.7 kg)    GENERAL: Cachectic appearing Caucasian female patient.  Resting in the bed.  On nasal cannula. EYES: no pallor or icterus OROPHARYNX: no thrush or ulceration. NECK: supple, no masses felt LYMPH:  no palpable lymphadenopathy in the cervical, axillary or inguinal regions LUNGS: decreased breath sounds to auscultation at bases and  No wheeze or crackles HEART/CVS: regular rate & rhythm and no murmurs; No lower extremity edema ABDOMEN: abdomen soft, non-tender and normal bowel sounds Musculoskeletal:no cyanosis of digits and no clubbing  PSYCH: alert & oriented x 3 with fluent speech NEURO: no focal motor/sensory deficits SKIN:  no rashes or significant lesions  LABORATORY DATA:  I have reviewed the data as listed Lab Results  Component Value Date   WBC 5.6 11/22/2017   HGB 10.9 (L) 11/22/2017   HCT 33.4 (L) 11/22/2017   MCV 90.5 11/22/2017   PLT 267  11/22/2017   Recent Labs    11/21/17 1806 11/22/17 0755 11/22/17 1421  NA 140 139  --   K 3.0* 2.4* 3.6  CL 100 102  --   CO2 31 31  --   GLUCOSE 180* 77  --   BUN 23 17  --   CREATININE 0.62 0.67  --   CALCIUM 8.5* 7.7*  --   GFRNONAA >60 >60  --   GFRAA >60 >60  --   PROT 6.1*  --   --   ALBUMIN 2.5*  --   --   AST 34  --   --   ALT 16  --   --   ALKPHOS 121  --   --   BILITOT 0.6  --   --     RADIOGRAPHIC STUDIES: I have personally reviewed the radiological images as listed and agreed with the findings in the report. Ct Chest W Contrast  Result Date: 11/21/2017 CLINICAL DATA:  72 year old female with right upper lobe nodule on portable chest radiograph earlier today. Weakness. EXAM: CT CHEST WITH CONTRAST TECHNIQUE: Multidetector CT imaging of the chest was performed during intravenous contrast administration. CONTRAST:  59mL OMNIPAQUE IOHEXOL 300 MG/ML  SOLN COMPARISON:  Portable chest 1743 hours today.  Chest CT 02/21/2016. FINDINGS: Cardiovascular: Cardiac size is at the upper limits of normal. No pericardial effusion. Ectatic thoracic aorta with soft and calcified atherosclerosis. Calcified coronary artery atherosclerosis. Mediastinum/Nodes: Bulky superior mediastinal lymphadenopathy measuring 27 millimeter short axis (38 millimeters long axis) with smaller abnormal right paratracheal nodes. Mildly enlarged right hilar lymph node measuring 12 millimeters. No left hilar lymphadenopathy. Small gastric hiatal hernia. Lungs/Pleura: Chronic  emphysema.  The major airways are patent. There are several right upper lobe lung nodules anteriorly. Each of the nodules appears somewhat spiculated and has a morphology suggesting extension along the adjacent airways (coronal image 40. The largest lesion individually is up to 24 millimeters. Some of these appear to extend to the pleural surface. These are new since 2018. No associated pleural effusion or other new pulmonary nodule or opacity.  Upper Abdomen: Cachectic. Stable visible upper abdominal viscera including prior cholecystectomy, left nephrolithiasis. Musculoskeletal: Chronic compression fractures of T2, T5, T7, T8, T10, T11, T12 and L1 appear stable since 2018. Interval moderate to severe T3 compression. Chronic but increased sternal fractures since 2018. No destructive osseous lesion identified. IMPRESSION: 1. Cachectic with Right Upper Lobe Lung Cancer suspected: several new spiculated upper lobe lung nodules since 2018 are associated with new and bulky superior mediastinal lymphadenopathy (nodes up to 3.8 cm). 2. Underlying Emphysema (ICD10-J43.9). Ectatic aorta with Atherosclerosis (ICD10-I70.0). 3. Increased since 2018 chronic spinal compression and sternal fractures which are probably benign/osteoporotic. No destructive osseous lesion is identified. Electronically Signed   By: Genevie Ann M.D.   On: 11/21/2017 20:27   Dg Chest Portable 1 View  Result Date: 11/21/2017 CLINICAL DATA:  Weakness. EXAM: PORTABLE CHEST 1 VIEW COMPARISON:  01/09/2017, 02/21/2016 and chest CT 02/21/2016 FINDINGS: Lungs are adequately inflated without focal airspace consolidation or effusion. 1.4 cm nodular density over the right apex projecting over the anterior right first rib end. Cardiac silhouette is within normal. Prominence of the right paratracheal soft tissues in the region of the azygos vein. Hiatal hernia. Remainder the exam is unchanged. IMPRESSION: No acute cardiopulmonary disease. 1.4 cm nodular density over the right apex. Prominent right paramediastinal density in the region of the azygos vein as adenopathy is possible. Recommend contrast-enhanced chest CT for further evaluation. Electronically Signed   By: Marin Olp M.D.   On: 11/21/2017 18:15    ASSESSMENT & PLAN:   #72 year old female patient long-standing history of smoking/ severe COPD is currently admitted to hospital for worsening shortness of breath noted to have lung  mass  #Right upper lobe lung nodules/mass; evidence of mediastinal adenopathy-highly suggestive of primary lung cancer/question small cell.  Patient will need biopsy-to conclusively prove the diagnosis/histology.   #Advanced COPD-slightly improved respiratory status since admission.  Breathing treatments/antibiotics as per primary team.  #  Prognosis-overall poor/frail status. I had a long discussion with the patient's daughter-in-law Catheryn Bacon patient's permission]-regarding the patient's clinical status.  Patient as per the daughter-in-law is homebound/ ? mostly bed bound for the last few years.  In general she has had a poor quality of life over the last few years.  As per the palliative care discussion with the patient revealed that patient is not too keen on aggressive measures.  Based on my assessment-given the patient's poor performance status/advanced COPD/cachexia-poor candidate for palliative chemoradiation therapy.  Also discussed with the daughter-in-law-utility/futility of a biopsy patient is unable to undergo chemotherapy/radiation therapies.  No conclusive decisions made.  Will again reevaluate in approximately 1 week outpatient. Discuss with pulmonologist Dr.Kasa.   Thank you Dr.Modi for allowing me to participate in the care of your pleasant patient. Please do not hesitate to contact me with questions or concerns in the interim.   # I reviewed the blood work- with the patient in detail; also reviewed the imaging independently [as summarized above]; and with the patient in detail.   All questions were answered. The patient knows to call the clinic with  any problems, questions or concerns.    Cammie Sickle, MD 11/22/2017 5:40 PM

## 2017-11-22 NOTE — Progress Notes (Signed)
Initial Nutrition Assessment  DOCUMENTATION CODES:   Severe malnutrition in context of chronic illness  INTERVENTION:   Pt at high refeeding risk; recommend monitor K, Mg and P labs for at least 3 days   Ensure Enlive po BID, each supplement provides 350 kcal and 20 grams of protein  Vital Cuisine BID, each supplement provides 520kcal and 22g of protein.   Magic cup TID with meals, each supplement provides 290 kcal and 9 grams of protein  MVI daily  Vitamin C 546m po BID  NUTRITION DIAGNOSIS:   Severe Malnutrition related to chronic illness(COPD, lung mass) as evidenced by 28 percent weight loss in 10 months, severe fat depletion, severe muscle depletion.  GOAL:   Patient will meet greater than or equal to 90% of their needs  MONITOR:   PO intake, Supplement acceptance, Labs, Weight trends, Skin, I & O's  REASON FOR ASSESSMENT:   Consult Assessment of nutrition requirement/status  ASSESSMENT:   72year old female with PMH of COPD, duodenal ulcer presents for evaluation after social service was this was called to do a wellness check, the patient was found to be in a residence in very poor condition, covered in feces and insects. Pt found to have new lung mass   Met with pt in room today. Pt is a poor historian so able to only provide limited history. Pt reports that she has been starving at home. Pt reports that she is unsure of what or when she eats. Pt is able to tell me that she does not like Ensure but later tells me that she is willing to drink strawberry Ensure. Pt also reports food getting stuck in her throat sometimes. Pt only ate bites of her breakfast this morning and reports vomiting or spitting up mucous with a piece of potato afterwards. 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 Per chart, pt has lost 26lbs(28%) wt loss over the past 10 months; this is severe. RD will order supplements and vitamins to help pt meet  her estimated needs. Pt likely at high refeeding risk; recommend monitor K, Mg and P labs daily until stable.    Medications reviewed and include: colace, nicotine, protonix, NaCl _0 /hr, Mg sulfate, KCl  Labs reviewed: K 2.4(L), P 2.7 wnl, Mg 1.6(L) Hgb 10.9(L), Hct 33.4(L)  NUTRITION - FOCUSED PHYSICAL EXAM:    Most Recent Value  Orbital Region  Severe depletion  Upper Arm Region  Severe depletion  Thoracic and Lumbar Region  Severe depletion  Buccal Region  Severe depletion  Temple Region  Severe depletion  Clavicle Bone Region  Severe depletion  Clavicle and Acromion Bone Region  Severe depletion  Scapular Bone Region  Severe depletion  Dorsal Hand  Severe depletion  Patellar Region  Severe depletion  Anterior Thigh Region  Severe depletion  Posterior Calf Region  Severe depletion  Edema (RD Assessment)  None  Hair  Reviewed [dry, brittle, thinning ]  Eyes  Reviewed  Mouth  Reviewed  Skin  Reviewed [ecchymosis]  Nails  Reviewed     Diet Order:   Diet Order            Diet regular Room service appropriate? Yes; Fluid consistency: Thin  Diet effective now             EDUCATION NEEDS:   No education needs have been identified at this time  Skin:  Skin Assessment: Reviewed RN Assessment(Stage 1 hip and coccyx, ecchymosis )  Last BM:  10/24  Height:   Ht Readings from Last 1 Encounters:  11/22/17 _0  (1.499 m)    Weight:   Wt Readings from Last 1 Encounters:  11/22/17 29.7 kg    Ideal Body Weight:  44.5 kg  BMI:  Body mass index is 13.22 kg/m.  Estimated Nutritional Needs:   Kcal:  1000-1200kcal/day   Protein:  48-54g/day   Fluid:  700-950m/day  CKoleen DistanceMS, RD, LDN Pager #- [phone]#- [phone] Hours Pager: [phone]

## 2017-11-22 NOTE — Progress Notes (Signed)
Pharmacy Electrolyte Monitoring Consult:  Pharmacy consulted to assist in monitoring and replacing electrolytes in this 72 y.o. female admitted on 11/21/2017 with Weight Loss (Faliure to [employer] )   Labs:  Sodium (mmol/L)  Date Value  11/21/2017 140   Potassium (mmol/L)  Date Value  11/21/2017 3.0 (L)   Magnesium (mg/dL)  Date Value  02/07/2016 1.8   Calcium (mg/dL)  Date Value  11/21/2017 8.5 (L)   Albumin (g/dL)  Date Value  11/21/2017 2.5 (L)    Assessment/Plan: Will replace K+ w/ 10 mEq IV x 4, Mg/phos level in progresses. Will recheck electrolytes w/ am labs.  Tobie Lords, PharmD, BCPS Clinical Pharmacist 11/22/2017

## 2017-11-22 NOTE — Progress Notes (Signed)
Palliative:  Update: I have spoken with son Melissa Watkins. He agrees with DNR. He understands the situation and admits that he is sad but also not surprised. He has seen her decline. His father/her husband died ~14-15 years ago from lung cancer. He says that his mother was stubborn and refused medical attention and it was difficult for him to see her dwindle away over the past few years. He understands that options are probably poor and that comfort is likely the best we can provide for her. 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 He understands that prognosis is very poor. Will need continued discussion regarding likely hospice care (at home vs hospice facility???) and desire for biopsy. I would recommend against biopsy given the likelihood this is not going to change plan of care. Will need to be further discussed with Melissa Watkins. Her son, Melissa Watkins, seems to agree. Will plan to keep Melissa Watkins (or his wife Melissa Watkins) updated with recommendations.   Vinie Sill, NP Palliative Medicine Team Pager # [phone] (M-F 8a-5p) Team Phone # [phone] (Nights/Weekends)

## 2017-11-23 ENCOUNTER — Telehealth: Payer: Self-pay | Admitting: Internal Medicine

## 2017-11-23 DIAGNOSIS — Z66 Do not resuscitate: Secondary | ICD-10-CM

## 2017-11-23 LAB — BASIC METABOLIC PANEL
ANION GAP: 6 (ref 5–15)
BUN: 13 mg/dL (ref 8–23)
CO2: 28 mmol/L (ref 22–32)
Calcium: 7.9 mg/dL — ABNORMAL LOW (ref 8.9–10.3)
Chloride: 105 mmol/L (ref 98–111)
Creatinine, Ser: 0.52 mg/dL (ref 0.44–1.00)
GFR calc Af Amer: >60 mL/min (ref >60)
GFR calc non Af Amer: >60 mL/min (ref >60)
GLUCOSE: 91 mg/dL (ref 70–99)
POTASSIUM: 3 mmol/L — AB (ref 3.5–5.1)
Sodium: 139 mmol/L (ref 135–145)

## 2017-11-23 LAB — GLUCOSE, CAPILLARY: GLUCOSE-CAPILLARY: 74 mg/dL (ref 70–99)

## 2017-11-23 LAB — MAGNESIUM: Magnesium: 2.4 mg/dL (ref 1.7–2.4)

## 2017-11-23 LAB — PHOSPHORUS: Phosphorus: 2.4 mg/dL — ABNORMAL LOW (ref 2.5–4.6)

## 2017-11-23 MED ORDER — NICOTINE 21 MG/24HR TD PT24: 21.0000 mg | MEDICATED_PATCH | Freq: Every day | TRANSDERMAL | 0 refills | Status: AC

## 2017-11-23 MED ORDER — ALBUTEROL SULFATE (2.5 MG/3ML) 0.083% IN NEBU: 2.5000 mg | INHALATION_SOLUTION | Freq: Four times a day (QID) | RESPIRATORY_TRACT | 12 refills | Status: AC | PRN

## 2017-11-23 MED ORDER — METOPROLOL TARTRATE 25 MG PO TABS: 25.0000 mg | ORAL_TABLET | Freq: Two times a day (BID) | ORAL | 0 refills | Status: AC

## 2017-11-23 MED ORDER — ENSURE ENLIVE PO LIQD: 237.0000 mL | Freq: Two times a day (BID) | ORAL | 12 refills | Status: AC

## 2017-11-23 MED ORDER — MOMETASONE FURO-FORMOTEROL FUM 200-5 MCG/ACT IN AERO: 2.0000 | INHALATION_SPRAY | Freq: Two times a day (BID) | RESPIRATORY_TRACT | 0 refills | Status: AC

## 2017-11-23 MED ORDER — ALPRAZOLAM 0.25 MG PO TABS
0.2500 mg | ORAL_TABLET | Freq: Three times a day (TID) | ORAL | Status: DC | PRN
  Administered 2017-11-23: 0.25 mg via ORAL
  Filled 2017-11-23: qty 1

## 2017-11-23 MED ORDER — POTASSIUM PHOSPHATE MONOBASIC 500 MG PO TABS
1000.0000 mg | ORAL_TABLET | Freq: Three times a day (TID) | ORAL | Status: AC
  Administered 2017-11-23 (×2): 1000 mg via ORAL
  Filled 2017-11-23 (×2): qty 2

## 2017-11-23 MED ORDER — POTASSIUM CHLORIDE 10 MEQ/100ML IV SOLN
10.0000 meq | INTRAVENOUS | Status: AC
  Administered 2017-11-23 (×2): 10 meq via INTRAVENOUS
  Filled 2017-11-23 (×2): qty 100

## 2017-11-23 MED ORDER — AMLODIPINE BESYLATE 5 MG PO TABS: 5.0000 mg | ORAL_TABLET | Freq: Every day | ORAL | 0 refills | Status: AC

## 2017-11-23 MED ORDER — DEXTROSE-NACL 5-0.45 % IV SOLN: INTRAVENOUS | Status: DC

## 2017-11-23 NOTE — Evaluation (Signed)
Physical Therapy Evaluation Patient Details Name: Melissa Watkins MRN: [id number] DOB: 02-Jul-1945 Today's Date: 11/23/2017   History of Present Illness  Pt is a 72 y.o female presents with EMS following a welfare check determining pt house was unihabitable covered in feces and insects. Pt found to have mass of lung however reports no difficulty with breathing. PMH significant for back pain, COPD, hip fracture with surgery, and left wrist fracture.   Clinical Impression  Pt awake and alert upon arrival very frail and HOH. Pt indicating she is unable to walk and has not even stood up in months. Pt unable to tolerate any resistance with strength testing but is able to actively move all joints against gravity. During bed mobility pt requires mod assist to get into sitting and get back to supine due to significant weakness. STS performed with min assist given but pt unable to achieve full standing. Pt unable to tolerate more than a few seconds of partial standing before leaning heavily against therapist's hands. SpO2 remaining in high 90's and HR in 70's. Pt currently has deficits in mobility, strength, and unable to perform ADL's thus would be most appropriate for STR.     Follow Up Recommendations SNF    Equipment Recommendations  (pt has RW at home)    Recommendations for Other Services       Precautions / Restrictions Precautions Precautions: Fall Restrictions Weight Bearing Restrictions: No      Mobility  Bed Mobility Overal bed mobility: Needs Assistance Bed Mobility: Supine to Sit;Sit to Supine     Supine to sit: Mod assist Sit to supine: Mod assist   General bed mobility comments: Pt requiring mod assist for basic bed mobility d/t weakness. Assistance needed to elevate trunk however pt able to bring BLE off bed independently. Unable to bring legs back to bed needing mod assist from therapist.   Transfers Overall transfer level: Needs assistance Equipment used: Rolling  walker (2 wheeled) Transfers: Sit to/from Stand Sit to Stand: Max assist;+2 safety/equipment         General transfer comment: Pt unable to get into full standing. +2 for safety due to frailty. Only min assist needed to bring hips off bed but pt immediately leaning posteriorly into PT hand requiring max assist to maintain just a few seconds of partial standing. Pt never reaching full standing.   Ambulation/Gait             General Gait Details: No ambulation performed pt unable to tolerate standing  Stairs            Wheelchair Mobility    Modified Rankin (Stroke Patients Only)       Balance Overall balance assessment: Mild deficits observed, not formally tested                                           Pertinent Vitals/Pain Pain Assessment: Faces Faces Pain Scale: Hurts a little bit Pain Location: low back Pain Intervention(s): Monitored during session    Home Living Family/patient expects to be discharged to:: Unsure Living Arrangements: Children               Additional Comments: Social services declaring her house uninhabitable.     Prior Function                 Hand Dominance  Extremity/Trunk Assessment   Upper Extremity Assessment Upper Extremity Assessment: Generalized weakness(full active movement in BUE except for L wrist flexion limited from history of fracture. Pt unable to tolerate resistance grossly 3/5)    Lower Extremity Assessment Lower Extremity Assessment: Generalized weakness(Grossly 3/5 pt unable to tolerate resistance)       Communication   Communication: HOH  Cognition Arousal/Alertness: Awake/alert Behavior During Therapy: WFL for tasks assessed/performed Overall Cognitive Status: Within Functional Limits for tasks assessed                                        General Comments      Exercises     Assessment/Plan    PT Assessment Patient needs continued PT  services  PT Problem List Decreased strength;Decreased knowledge of use of DME;Decreased activity tolerance;Decreased safety awareness;Decreased balance;Decreased mobility;Cardiopulmonary status limiting activity       PT Treatment Interventions DME instruction;Gait training;Balance training;Modalities;Stair training;Functional mobility training;Patient/family education;Therapeutic activities;Therapeutic exercise    PT Goals (Current goals can be found in the Care Plan section)  Acute Rehab PT Goals PT Goal Formulation: Patient unable to participate in goal setting    Frequency Min 2X/week   Barriers to discharge Inaccessible home environment      Co-evaluation               AM-PAC PT "6 Clicks" Daily Activity  Outcome Measure Difficulty turning over in bed (including adjusting bedclothes, sheets and blankets)?: A Lot Difficulty moving from lying on back to sitting on the side of the bed? : Unable Difficulty sitting down on and standing up from a chair with arms (e.g., wheelchair, bedside commode, etc,.)?: Unable Help needed moving to and from a bed to chair (including a wheelchair)?: Total Help needed walking in hospital room?: Total Help needed climbing 3-5 steps with a railing? : Total 6 Click Score: 7    End of Session   Activity Tolerance: Patient limited by fatigue;Other (comment)(limited by weakness) Patient left: in bed;with call bell/phone within reach;with bed alarm set Nurse Communication: Mobility status PT Visit Diagnosis: Unsteadiness on feet (R26.81);Difficulty in walking, not elsewhere classified (R26.2);Other abnormalities of gait and mobility (R26.89);History of falling (Z91.81);Muscle weakness (generalized) (M62.81)    Time: 5929-2446 PT Time Calculation (min) (ACUTE ONLY): 34 min   Charges:              Ernie Avena, SPT 11/23/2017, 1:09 PM

## 2017-11-23 NOTE — Progress Notes (Signed)
Pharmacy Electrolyte Monitoring Consult:  Pharmacy consulted to assist in monitoring and replacing electrolytes in this 72 y.o. female admitted on 11/21/2017 with Weight Loss (Faliure to [employer]). She is likely at high refeeding risk, therefore pharmacy will monitor K, Mg and P labs daily until stable.    Labs:   Potassium (mmol/L)  Date Value  11/23/2017 3.0 (L)   Magnesium (mg/dL)  Date Value  11/23/2017 2.4   Phosphorus (mg/dL)  Date Value  11/23/2017 2.4 (L)   Calcium (mg/dL)  Date Value  11/23/2017 7.9 (L)   Albumin (g/dL)  Date Value  11/21/2017 2.5 (L)  ] Assessment/Plan: 10/24 0755 K 2.4 mmol/L, Mg 1.6mg /dL, phosphorous wnl, corrected calcium equals 8.9 mg/dL. Patient subsequently was administered KCl 10 mEq IV x 4 and 2 grams IV magnesium sulfate  10/24 1421 K 3.6 mmol/L: no further supplementation required  10/25 0536: K and phosphorous low. Replacement therapy on 10/24 of 40 mEq IV raised K from 2.4 to 3.6. Will order 2 runs IV KCl (13mEq total) and K-phos original 2 tabs TIDWC x2  Will recheck electrolytes w/ am labs and replace if needed.  Vallery Sa, PharmD Clinical Pharmacist 11/23/2017

## 2017-11-23 NOTE — Progress Notes (Addendum)
Daily Progress Note   Patient Name: Melissa Watkins       Date: 11/23/2017 DOB: 01-08-1946  Age: 72 y.o. MRN#: [id number] Attending Physician: Bettey Costa, MD Primary Care Physician: Venia Carbon, MD Admit Date: 11/21/2017  Reason for Consultation/Follow-up: Establishing goals of care  Subjective: Pleasant - sitting up in bed watching TV. Tells me she feels well. Off of oxygen.  Length of Stay: 2  Current Medications: Scheduled Meds:  . acetaminophen  650 mg Oral TID  . amLODipine  5 mg Oral Daily  . docusate sodium  100 mg Oral BID  . feeding supplement (ENSURE ENLIVE)  237 mL Oral BID BM  . Influenza vac split quadrivalent PF  0.5 mL Intramuscular Tomorrow-1000  . lidocaine  1 patch Transdermal Q24H  . metoprolol tartrate  25 mg Oral BID  . multivitamin with minerals  1 tablet Oral Daily  . nicotine  21 mg Transdermal Daily  . pantoprazole  40 mg Oral Daily  . pneumococcal 23 valent vaccine  0.5 mL Intramuscular Tomorrow-1000  . potassium phosphate (monobasic)  1,000 mg Oral TID WC & HS  . vitamin C  500 mg Oral BID    Continuous Infusions: . sodium chloride 75 mL/hr at 11/23/17 0202  . potassium chloride 10 mEq (11/23/17 1058)    PRN Meds: albuterol, bisacodyl, hydrALAZINE, HYDROcodone-acetaminophen, nicotine polacrilex, ondansetron **OR** ondansetron (ZOFRAN) IV, traZODone  Physical Exam         Constitutional: She is oriented to person, place, and time. She appears cachectic. She has a sickly appearance. She appears ill.  Extremely frail  Cardiovascular: Normal rate.  Pulmonary/Chest: Effort normal. No accessory muscle usage. No tachypnea. No respiratory distress.  Abdominal: Normal appearance.  Neurological: She is alert and oriented to person, place, and time.  Hard of  hearing  Nursing note and vitals reviewed.  Vital Signs: BP (!) 135/54   Pulse 85   Temp 98 F (36.7 C) (Oral)   Resp 20   Ht 4\' 11"  (1.499 m)   Wt 32.6 kg   SpO2 97%   BMI 14.52 kg/m  SpO2: SpO2: 97 % O2 Device: O2 Device: Room Air O2 Flow Rate:    Intake/output summary:   Intake/Output Summary (Last 24 hours) at 11/23/2017 1219 Last data filed at 11/23/2017 1039 Gross per 24 hour  Intake 1972.42 ml  Output 600 ml  Net 1372.42 ml   LBM: Last BM Date: 11/23/17 Baseline Weight: Weight: 40.8 kg Most recent weight: Weight: 32.6 kg       Palliative Assessment/Data: PPS 30%      Patient Active Problem List   Diagnosis Date Noted  . Protein-calorie malnutrition, severe 11/22/2017  . Pressure injury of skin 11/22/2017  . DNR (do not resuscitate)   . Goals of care, counseling/discussion   . Palliative care encounter   . Mass of right lung 11/21/2017  . Malnutrition of mild degree ([place]) 05/24/2016  . COPD (chronic obstructive pulmonary disease) ([place])   . Essential hypertension, benign   . Osteoporosis   . Osteoarthritis   . Acute bronchitis 02/25/2016  . COPD exacerbation ([place]) 02/25/2016  . Bilateral hydronephrosis 02/25/2016  . Bladder outlet obstruction 02/25/2016  .  Duodenal ulcer 02/25/2016  . Acute esophagitis 02/25/2016  . Palpitations 07/17/2013    Palliative Care Assessment & Plan   HPI: 72 y.o. female  with past medical history of chronic back pain, COPD, HTN, osteoarthritis, osteoporosis, h/o compression fracture admitted on 11/21/2017 with failure to thrive after welfare visit initiated EMS. Found to have BMI down to 13.22 (she was 99 lbs/BMI 20 in January 2019) and CT lung found several new spiculated right upper lobe lung nodules with superior mediastinal lymphadenopathy.   Assessment: Received update for RN who tells me patient is doing well today - off of oxygen and eating full liquid diet  Follow up with [name] today. She tells me how ill  she is - refers to her frailty and malnutrition. She again immediately talks about resuscitation and how she does not want that and is ready for death "when Jesus calls". However, she also tells me that she does not want to let her children down and feels as though "she must fight". We discussed her options and she tells me she would receive chemotherapy if is were offered. We discussed why she is a poor candidate for cancer treatment - poor functional status, advanced COPD, and cachexia - she seems accepting of this.   When then discussed biopsy and discussed why it may be futile - she tells me "I'm the type of person that needs answers". She later shares "I don't believe that it is cancer". She tells me she feels this way because her husband had cancer and she does not feel the way he felt. We discussed high suspicion of lung cancer - discussed difficulty breathing yesterday and severe weight loss.   I brought up hospice and she tells me "I know what that is". At this point, she is considering her options but seems to be leaning towards going to rehab.  Noted that patient is being discharged today - appears she is going to SNF rehab.  Recommendations/Plan:  Patient continues to insist on DNR but is considering other options  Tells me she may take chemo if it were offered - we discussed why this may not be a good option for her  She is also interested in proceeding with biopsy because "she needs answers" and doesn't believe she has cancer - we discussed this at length  She is interested in pursuing rehab  Continue dulcolax, lidoderm, and scheduled tylenol  Rec OP pallaitive  ADDENDUM: Spoke with [name] (daughter in law) regarding conversation with patient. She is concerned about patient insisting on biopsy. She understands poor prognosis. Long discussion about EOL expectations. Agrees to rehab and palliative care for now.   Code Status:  DNR  Prognosis:   Unable to determine  Discharge  Planning:  [place] for rehab with Palliative care service follow-up  Care plan was discussed with patient and RN  Thank you for allowing the Palliative Medicine Team to assist in the care of this patient.   Total Time 40 minutes Prolonged Time Billed  no       Greater than 50%  of this time was spent counseling and coordinating care related to the above assessment and plan.  Juel Burrow, DNP, [facility] Palliative Medicine Team Team Phone # [phone]  Pager [phone]

## 2017-11-23 NOTE — Clinical Social Work Note (Signed)
Clinical Social Work Assessment  Patient Details  Name: Melissa Watkins MRN: [id number] Date of Birth: 1945-03-27  Date of referral:  11/23/17               Reason for consult:  Facility Placement                Permission sought to share information with:    Permission granted to share information::     Name::        Agency::     Relationship::     Contact Information:     Housing/Transportation Living arrangements for the past 2 months:  Single Family Home Source of Information:  (Dept of Social Services APS) Patient Interpreter Needed:  None Criminal Activity/Legal Involvement Pertinent to Current Situation/Hospitalization:  No - Comment as needed Significant Relationships:  Adult Children Lives with:  Adult Children Do you feel safe going back to the place where you live?  No Need for family participation in patient care:  Yes (Comment)  Care giving concerns:  Patient resides with a son who has not been able to care for patient. Patient's other son, [name], and his wife [name] are assisting with the the healthcare plan and discharge disposition.   Social Worker assessment / plan:  Patient has been residing with one of her sons who was unable to care for her. DSS Adult Protective Services is involved and have stated she will need to be placed in facility. PT has evaluated patient and recommended short term rehab. Patient's daughter in law: [name] (husband is patient's other son: [name]) has accepted bed offer from [facility]. She is going to notify her husband of the information as he is a Administrator. Patient has potential cancer but is stating she would like to pursue options at this time. Patient will have Palliative follow at [facility] healthcare.    Employment status:    Insurance information:  Managed Care PT Recommendations:  [place] Information / Referral to community resources:     Patient/Family's Response to care:  Patient's daughter in law  beth expressed appreciation for CSW assistance.  Patient/Family's Understanding of and Emotional Response to Diagnosis, Current Treatment, and Prognosis:  Patient's daughter in law is aware of patient's potential prognosis and is in agreement with current plan, as is patient.   Emotional Assessment Appearance:  Appears stated age Attitude/Demeanor/Rapport:    Affect (typically observed):  Calm Orientation:  Oriented to Self, Oriented to Place, Oriented to Situation Alcohol / Substance use:  Not Applicable Psych involvement (Current and /or in the community):  No (Comment)  Discharge Needs  Concerns to be addressed:  Care Coordination Readmission within the last 30 days:  No Current discharge risk:  None Barriers to Discharge:  No Barriers Identified   Shela Leff, LCSW 11/23/2017, 2:39 PM

## 2017-11-23 NOTE — Progress Notes (Signed)
Melissa Watkins   DOB:1945/12/16   JH#:[id number]    Subjective: Patient sitting in the bed.  Watching TV.  Denies any pain.  Breathing improved.  Objective:  Vitals:   11/23/17 0851 11/23/17 1230  BP: (!) 135/54 (!) 138/98  Pulse: 85 75  Resp:  16  Temp:    SpO2:  100%     Intake/Output Summary (Last 24 hours) at 11/23/2017 1703 Last data filed at 11/23/2017 1418 Gross per 24 hour  Intake 2504.34 ml  Output 600 ml  Net 1904.34 ml    GENERAL: Cachectic appearing Caucasian female patient.  Resting in the bed.  On nasal cannula. EYES: no pallor or icterus OROPHARYNX: no thrush or ulceration. NECK: supple, no masses felt LYMPH:  no palpable lymphadenopathy in the cervical, axillary or inguinal regions LUNGS: decreased breath sounds to auscultation at bases and  No wheeze or crackles HEART/CVS: regular rate & rhythm and no murmurs; No lower extremity edema ABDOMEN: abdomen soft, non-tender and normal bowel sounds Musculoskeletal:no cyanosis of digits and no clubbing  PSYCH: alert & oriented x 3 with fluent speech NEURO: no focal motor/sensory deficits SKIN:  no rashes or significant lesions   Labs:  Lab Results  Component Value Date   WBC 5.6 11/22/2017   HGB 10.9 (L) 11/22/2017   HCT 33.4 (L) 11/22/2017   MCV 90.5 11/22/2017   PLT 267 11/22/2017   NEUTROABS 4.3 11/21/2017    Lab Results  Component Value Date   NA 139 11/23/2017   K 3.0 (L) 11/23/2017   CL 105 11/23/2017   CO2 28 11/23/2017    Studies:  Ct Chest W Contrast  Result Date: 11/21/2017 CLINICAL DATA:  72 year old female with right upper lobe nodule on portable chest radiograph earlier today. Weakness. EXAM: CT CHEST WITH CONTRAST TECHNIQUE: Multidetector CT imaging of the chest was performed during intravenous contrast administration. CONTRAST:  35mL OMNIPAQUE IOHEXOL 300 MG/ML  SOLN COMPARISON:  Portable chest 1743 hours today.  Chest CT 02/21/2016. FINDINGS: Cardiovascular: Cardiac size is at the  upper limits of normal. No pericardial effusion. Ectatic thoracic aorta with soft and calcified atherosclerosis. Calcified coronary artery atherosclerosis. Mediastinum/Nodes: Bulky superior mediastinal lymphadenopathy measuring 27 millimeter short axis (38 millimeters long axis) with smaller abnormal right paratracheal nodes. Mildly enlarged right hilar lymph node measuring 12 millimeters. No left hilar lymphadenopathy. Small gastric hiatal hernia. Lungs/Pleura: Chronic emphysema.  The major airways are patent. There are several right upper lobe lung nodules anteriorly. Each of the nodules appears somewhat spiculated and has a morphology suggesting extension along the adjacent airways (coronal image 40. The largest lesion individually is up to 24 millimeters. Some of these appear to extend to the pleural surface. These are new since 2018. No associated pleural effusion or other new pulmonary nodule or opacity. Upper Abdomen: Cachectic. Stable visible upper abdominal viscera including prior cholecystectomy, left nephrolithiasis. Musculoskeletal: Chronic compression fractures of T2, T5, T7, T8, T10, T11, T12 and L1 appear stable since 2018. Interval moderate to severe T3 compression. Chronic but increased sternal fractures since 2018. No destructive osseous lesion identified. IMPRESSION: 1. Cachectic with Right Upper Lobe Lung Cancer suspected: several new spiculated upper lobe lung nodules since 2018 are associated with new and bulky superior mediastinal lymphadenopathy (nodes up to 3.8 cm). 2. Underlying Emphysema (ICD10-J43.9). Ectatic aorta with Atherosclerosis (ICD10-I70.0). 3. Increased since 2018 chronic spinal compression and sternal fractures which are probably benign/osteoporotic. No destructive osseous lesion is identified. Electronically Signed   By: Herminio Heads.D.  On: 11/21/2017 20:27   Dg Chest Portable 1 View  Result Date: 11/21/2017 CLINICAL DATA:  Weakness. EXAM: PORTABLE CHEST 1 VIEW COMPARISON:   01/09/2017, 02/21/2016 and chest CT 02/21/2016 FINDINGS: Lungs are adequately inflated without focal airspace consolidation or effusion. 1.4 cm nodular density over the right apex projecting over the anterior right first rib end. Cardiac silhouette is within normal. Prominence of the right paratracheal soft tissues in the region of the azygos vein. Hiatal hernia. Remainder the exam is unchanged. IMPRESSION: No acute cardiopulmonary disease. 1.4 cm nodular density over the right apex. Prominent right paramediastinal density in the region of the azygos vein as adenopathy is possible. Recommend contrast-enhanced chest CT for further evaluation. Electronically Signed   By: Marin Olp M.D.   On: 11/21/2017 18:15    Assessment & Plan:    # 72 year old female patient long-standing history of smoking/ severe COPD is currently admitted to hospital for worsening shortness of breath noted to have lung mass  #Right upper lobe lung nodules/mass; evidence of mediastinal adenopathy-highly suggestive of primary lung cancer/question small cell.    # Advanced COPD-slightly improved respiratory status since admission.   #Prognosis poor; agree with DNR/DNI.  Given patient overall frailty/cachexia/homebound status-mobility-patient is a poor candidate for chemoradiation therapy.  Discussed with patient's daughter-in-law [name].  I also spoke to pulmonologist Dr. Mortimer Fries.  Recommend outpatient follow-up/to review the biopsy plan.   Cammie Sickle, MD 11/23/2017  5:03 PM

## 2017-11-23 NOTE — Progress Notes (Signed)
Speech Language Pathology Treatment: Dysphagia  Patient Details Name: Melissa Watkins MRN: [id number] DOB: [birth year] Today's Date: 11/23/2017 Time: 1150-1230 SLP Time Calculation (min) (ACUTE ONLY): 40 min  Assessment / Plan / Recommendation Clinical Impression  Pt seen for ongoing assessment of toleration of diet. Pt is on the recommended Full Liquid diet and is tolerating this well per NSG report. She especially loves "ice cream". Pt appeared much more alert today vs at yesterday's eval. She engaged in more verbal communication w/ SLP laughing at times.  Pt was fairly upright and forward in her position in bed; legs drawn up. She was feeding herself during the lunch meal consuming thin liquids via cup/straw and 2 different puree-type foods as well. She seemed to gravitate more toward the liquids but appeared to better swallow all trials during the meal w/ no overt s/s of aspiration noted. She exhibited clear vocal quality b/t trials during mumbled speech and talking about the Nurse bringing her "lots of ice cream today". Oral phase was still c/b min slower bolus A-P movement, slight oral holding, in her readiness to swallow. Moreso suspect this could be more Esophageal Dysmotility in nature as she did not exhibit any pharyngeal phase deficits during swallowing or overt s/s of aspiration. 4-5 liquid trials were swallowed almost timely. Pt fed self w/ setup support, positioning. Recommend continue w/ a more Full Liquid diet consistency at this time as pt appears to be tolerating this diet as well as no Esophageal dysmotility has been reported(regurgitation of food material as w/ solid diet). Recommend GI f/u for full Esophageal assessment to r/o dysmotility, stricture. Recommend general aspiration precautions; Pills in puree for safer swallowing; monitoring at meals. ST services will be available for further education while pt is admitted. NSG updated on above.     HPI HPI: Pt is a 72 year old  female with PMH of COPD, duodenal ulcer presents for evaluation after social service was this was called to do a wellness check, the patient was found to be in a residence in very poor condition, covered in feces and insects. Pt found to have new lung mass. Chest CT revealed bulky superior mediastinal lymphadenopathy w/ abnormal right paratracheal nodes. Mildly enlarged right hilar lymph node. Pt has been having trouble with swallowing and holds liquids in her mouth for a very long period of time. Pt also reports food getting stuck in her throat sometimes. Pt only ate bites of her breakfast this morning and reports vomiting or spitting up mucous with a piece of potato afterwards. 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 Per chart, pt has lost 26lbs(28%) wt loss over the past 10 months; this is severe per the Dietitian.       SLP Plan  Continue with current plan of care       Recommendations  Diet recommendations: Dysphagia 1 (puree);Thin liquid(Full Liquid diet at this time) Liquids provided via: Cup;Straw Medication Administration: Crushed with puree Supervision: Patient able to self feed;Intermittent supervision to cue for compensatory strategies Compensations: Minimize environmental distractions;Slow rate;Small sips/bites;Lingual sweep for clearance of pocketing;Multiple dry swallows after each bite/sip;Follow solids with liquid Postural Changes and/or Swallow Maneuvers: Seated upright 90 degrees;Upright 30-60 min after meal                General recommendations: (GI consult; Dietician f/u) Oral Care Recommendations: Oral care BID;Patient independent with oral care;Staff/trained caregiver to provide oral care Follow up Recommendations: None(TBD) SLP Visit Diagnosis: Dysphagia, pharyngoesophageal phase (R13.14)(Esophageal phase  suspected) Plan: Continue with current plan of care       Melissa Watkins, Melissa Watkins,  CCC-SLP Melissa Watkins 11/23/2017, 4:28 PM

## 2017-11-23 NOTE — Telephone Encounter (Signed)
Melissa Watkins-Patient is currently discharged nursing home. Please reach out to patient's daughter-in-law Melissa Watkins regarding follow-up with [place] in approximately a week/also follow-up with pulmonary regarding discussion for biopsy.   GB

## 2017-11-23 NOTE — NC FL2 (Signed)
[place] LEVEL OF CARE SCREENING TOOL     IDENTIFICATION  Patient Name: Melissa Watkins Birthdate: [birth year] Sex: female Admission Date (Current Location): 11/21/2017  [place] and [place] Number:  Engineering geologist and Address:  [facility], [address] [place], [place] [address]      Provider Number: [id number]  Attending Physician Name and Address:  Bettey Costa, MD  Relative Name and Phone Number:       Current Level of Care: Hospital Recommended Level of Care: [place] Prior Approval Number:    Date Approved/Denied:   PASRR Number: [id number]  Discharge Plan: SNF    Current Diagnoses: Patient Active Problem List   Diagnosis Date Noted  . Protein-calorie malnutrition, severe 11/22/2017  . Pressure injury of skin 11/22/2017  . DNR (do not resuscitate)   . Goals of care, counseling/discussion   . Palliative care encounter   . Mass of right lung 11/21/2017  . Malnutrition of mild degree ([place]) 05/24/2016  . COPD (chronic obstructive pulmonary disease) ([place])   . Essential hypertension, benign   . Osteoporosis   . Osteoarthritis   . Acute bronchitis 02/25/2016  . COPD exacerbation ([place]) 02/25/2016  . Bilateral hydronephrosis 02/25/2016  . Bladder outlet obstruction 02/25/2016  . Duodenal ulcer 02/25/2016  . Acute esophagitis 02/25/2016  . Palpitations 07/17/2013    Orientation RESPIRATION BLADDER Height & Weight     Self, Time, Place  Normal Incontinent Weight: 71 lb 13.9 oz (32.6 kg) Height:  4\' 11"  (149.9 cm)  BEHAVIORAL SYMPTOMS/MOOD NEUROLOGICAL BOWEL NUTRITION STATUS  (none) (none) Incontinent Diet(regular)  AMBULATORY STATUS COMMUNICATION OF NEEDS Skin   Extensive Assist Verbally PU Stage and Appropriate Care                       Personal Care Assistance Level of Assistance  Dressing, Feeding, Bathing Bathing Assistance: Limited assistance Feeding assistance: Limited  assistance Dressing Assistance: Limited assistance     Functional Limitations Info  (none noted)          SPECIAL CARE FACTORS FREQUENCY  PT (By licensed PT)                    Contractures Contractures Info: Not present    Additional Factors Info  Code Status Code Status Info: dnr             Current Medications (11/23/2017):  This is the current hospital active medication list Current Facility-Administered Medications  Medication Dose Route Frequency Provider Last Rate Last Dose  . 0.9 %  sodium chloride infusion   Intravenous Continuous Amelia Jo, MD 75 mL/hr at 11/23/17 0202    . acetaminophen (TYLENOL) tablet 650 mg  650 mg Oral TID Pershing Proud, NP   440 mg at 11/23/17 0850  . albuterol (PROVENTIL) (2.5 MG/3ML) 0.083% nebulizer solution 2.5 mg  2.5 mg Inhalation Q6H PRN Amelia Jo, MD      . amLODipine (NORVASC) tablet 5 mg  5 mg Oral Daily Bettey Costa, MD   5 mg at 11/23/17 0851  . bisacodyl (DULCOLAX) EC tablet 5 mg  5 mg Oral Daily PRN Amelia Jo, MD      . docusate sodium (COLACE) capsule 100 mg  100 mg Oral BID Amelia Jo, MD   100 mg at 11/22/17 2137  . feeding supplement (ENSURE ENLIVE) (ENSURE ENLIVE) liquid 237 mL  237 mL Oral BID BM Mody, Sital, MD   237 mL  at 11/22/17 1126  . hydrALAZINE (APRESOLINE) injection 10 mg  10 mg Intravenous Q6H PRN Bettey Costa, MD   10 mg at 11/22/17 1208  . HYDROcodone-acetaminophen (NORCO/VICODIN) 5-325 MG per tablet 1-2 tablet  1-2 tablet Oral Q4H PRN Amelia Jo, MD   1 tablet at 11/22/17 1244  . Influenza vac split quadrivalent PF (FLUZONE HIGH-DOSE) injection 0.5 mL  0.5 mL Intramuscular Tomorrow-1000 Amelia Jo, MD      . lidocaine (LIDODERM) 5 % 1 patch  1 patch Transdermal W43X Pershing Proud, NP   1 patch at 11/22/17 2015  . metoprolol tartrate (LOPRESSOR) tablet 25 mg  25 mg Oral BID Amelia Jo, MD   25 mg at 11/23/17 0851  . multivitamin with minerals tablet 1 tablet  1 tablet Oral Daily  Bettey Costa, MD   1 tablet at 11/23/17 0851  . nicotine (NICODERM CQ - dosed in mg/24 hours) patch 21 mg  21 mg Transdermal Daily Bettey Costa, MD   21 mg at 11/23/17 0851  . nicotine polacrilex (NICORETTE) gum 2 mg  2 mg Oral PRN Bettey Costa, MD      . ondansetron (ZOFRAN) tablet 4 mg  4 mg Oral Q6H PRN Amelia Jo, MD       Or  . ondansetron [facility]) injection 4 mg  4 mg Intravenous Q6H PRN Amelia Jo, MD      . pantoprazole (PROTONIX) EC tablet 40 mg  40 mg Oral Daily Amelia Jo, MD   40 mg at 11/23/17 0851  . pneumococcal 23 valent vaccine (PNU-IMMUNE) injection 0.5 mL  0.5 mL Intramuscular Tomorrow-1000 Amelia Jo, MD      . potassium chloride 10 mEq in 100 mL IVPB  10 mEq Intravenous Q2H Mody, Sital, MD 50 mL/hr at 11/23/17 1058 10 mEq at 11/23/17 1058  . potassium phosphate (monobasic) (K-PHOS ORIGINAL) tablet 1,000 mg  1,000 mg Oral TID WC & HS Mody, Sital, MD   1,000 mg at 11/23/17 1058  . traZODone (DESYREL) tablet 25 mg  25 mg Oral QHS PRN Amelia Jo, MD      . vitamin C (ASCORBIC ACID) tablet 500 mg  500 mg Oral BID Bettey Costa, MD   500 mg at 11/23/17 5400     Discharge Medications: Please see discharge summary for a list of discharge medications.  Relevant Imaging Results:  Relevant Lab Results:   Additional Information ss: [id number]  Shela Leff, LCSW

## 2017-11-23 NOTE — Discharge Summary (Addendum)
[place] [facility] [place] NAME: Melissa Watkins    MR#:  [id number]  DATE OF BIRTH:  10-19-1945  DATE OF ADMISSION:  11/21/2017 ADMITTING PHYSICIAN: Amelia Jo, MD  DATE OF DISCHARGE: 11/24/2017  PRIMARY CARE PHYSICIAN: Venia Carbon, MD    ADMISSION DIAGNOSIS:  Dehydration [E86.0] Hypokalemia [E87.6] Lung mass [R91.8]  DISCHARGE DIAGNOSIS:  Active Problems:   Mass of right lung   Protein-calorie malnutrition, severe   Pressure injury of skin   DNR (do not resuscitate)   Goals of care, counseling/discussion   Palliative care encounter   SECONDARY DIAGNOSIS:   Past Medical History:  Diagnosis Date  . Back pain, chronic   . COPD (chronic obstructive pulmonary disease) ([place])   . Duodenal ulcer 01/2016   and esophagitis (hospitalized)  . Essential hypertension, benign   . Essential hypertension, benign   . Osteoarthritis   . 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   1. 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  Recommendations are to optimize COPD and follow patient up as an outpatient with pulmonologist and oncologist for possible bronchoscopy/biopsy.  Patient has very poor performance and cachexia and may be poor candidate for palliative chemoradiation as per oncology.  Plan right now is to follow-up with the patient in 1 week for further discussion of plan.  Patient was evaluated by the palliative care services while in the hospital.  She will need to continue outpatient palliative care services.    2. Tobacco dependence: Patient is encouraged to quit smoking. Counseling was provided . She will be discharged on nicotine patch3. Hypokalemia/ hypomagnesia from poor nutrition: Replace and recheck in am.  4. Weight loss  with severe protein calorie malnutrition We will continue nutritional supplements  5. Essential IWP:YKDXIPJA metoprolol and increase dose of norvasc.  6. COPD: no signs of exacerbation. She will have outpatient follow-up with pulmonary  She will need outpatient palliative care service upon discharge  DISCHARGE CONDITIONS AND DIET:   Guarded condition on regular diet  CONSULTS OBTAINED:  Treatment Team:  Flora Lipps, MD Cammie Sickle, MD  DRUG ALLERGIES:   Allergies  Allergen Reactions  . Oxycodone-Acetaminophen Other (See Comments)    Hallucinations  . Valium [Diazepam]     DISCHARGE MEDICATIONS:   Allergies as of 11/23/2017      Reactions   Oxycodone-acetaminophen Other (See Comments)   Hallucinations   Valium [diazepam]       Medication List    STOP taking these medications   albuterol 108 (90 Base) MCG/ACT inhaler Commonly known as:  PROVENTIL HFA;VENTOLIN HFA Replaced by:  albuterol (2.5 MG/3ML) 0.083% nebulizer solution   lidocaine 5 % Commonly known as:  LIDODERM   omeprazole 40 MG capsule Commonly known as:  PRILOSEC   oxyCODONE 5 MG immediate release tablet Commonly known as:  Oxy IR/ROXICODONE     TAKE these medications   acetaminophen 650 MG CR tablet Commonly known as:  TYLENOL Take 650 mg by mouth 3 (three) times daily.   albuterol (2.5 MG/3ML) 0.083% nebulizer solution Commonly known as:  PROVENTIL Inhale 3 mLs (2.5 mg total) into the lungs every 6 (six) hours as needed for wheezing or shortness of breath. Replaces:  albuterol 108 (90 Base) MCG/ACT inhaler   amLODipine  5 MG tablet Commonly known as:  NORVASC Take 1 tablet (5 mg total) by mouth daily. What changed:    medication strength  how much to take   feeding supplement (ENSURE ENLIVE) Liqd Take 237 mLs by mouth 2 (two) times daily between meals.   metoprolol tartrate 25 MG tablet Commonly known as:  LOPRESSOR Take 1 tablet (25 mg total) by mouth 2 (two) times  daily.   mometasone-formoterol 200-5 MCG/ACT Aero Commonly known as:  DULERA Inhale 2 puffs into the lungs 2 (two) times daily.   nicotine 21 mg/24hr patch Commonly known as:  NICODERM CQ - dosed in mg/24 hours Place 1 patch (21 mg total) onto the skin daily. Start taking on:  11/24/2017         Today   CHIEF COMPLAINT:   No acute events overnight Pressure has improved   VITAL SIGNS:  Blood pressure (!) 135/54, pulse 85, temperature 98 F (36.7 C), temperature source Oral, resp. rate 20, height 4\' 11"  (1.499 m), weight 32.6 kg, SpO2 97 %.   REVIEW OF SYSTEMS:  Review of Systems  Constitutional: Positive for malaise/fatigue. Negative for chills and fever.  HENT: Positive for hearing loss. Negative for ear discharge, ear pain, nosebleeds and sore throat.   Eyes: Negative.  Negative for blurred vision and pain.  Respiratory: Negative.  Negative for cough, hemoptysis, shortness of breath and wheezing.   Cardiovascular: Negative.  Negative for chest pain, palpitations and leg swelling.  Gastrointestinal: Negative.  Negative for abdominal pain, blood in stool, diarrhea, nausea and vomiting.  Genitourinary: Negative.  Negative for dysuria.  Musculoskeletal: Negative.  Negative for back pain.  Skin: Negative.   Neurological: Negative for dizziness, tremors, speech change, focal weakness, seizures and headaches.  Endo/Heme/Allergies: Negative.  Does not bruise/bleed easily.  Psychiatric/Behavioral: Negative.  Negative for depression, hallucinations and suicidal ideas.     PHYSICAL EXAMINATION:  GENERAL:  72 y.o.-year-old patient lying in the bed with no acute distress.  Thin and frail NECK:  Supple, no jugular venous distention. No thyroid enlargement, no tenderness.  LUNGS: Normal breath sounds bilaterally, no wheezing, rales,rhonchi  No use of accessory muscles of respiration.  CARDIOVASCULAR: S1, S2 normal. No murmurs, rubs, or gallops.  ABDOMEN: Soft, non-tender,  non-distended. Bowel sounds present. No organomegaly or mass.  EXTREMITIES: No pedal edema, cyanosis, or clubbing.  PSYCHIATRIC: The patient is alert and oriented x 3.  SKIN: No obvious rash, lesion, or ulcer.   DATA REVIEW:   CBC Recent Labs  Lab 11/22/17 0755  WBC 5.6  HGB 10.9*  HCT 33.4*  PLT 267    Chemistries  Recent Labs  Lab 11/21/17 1806  11/23/17 0536  NA 140   < > 139  K 3.0*   < > 3.0*  CL 100   < > 105  CO2 31   < > 28  GLUCOSE 180*   < > 91  BUN 23   < > 13  CREATININE 0.62   < > 0.52  CALCIUM 8.5*   < > 7.9*  MG  --    < > 2.4  AST 34  --   --   ALT 16  --   --   ALKPHOS 121  --   --   BILITOT 0.6  --   --    < > = values in this interval not displayed.    Cardiac Enzymes No results for input(s): TROPONINI in the last 168 hours.  Microbiology Results  @MICRORSLT48 @  RADIOLOGY:  Ct Chest W Contrast  Result Date: 11/21/2017 CLINICAL DATA:  72 year old female with right upper lobe nodule on portable chest radiograph earlier today. Weakness. EXAM: CT CHEST WITH CONTRAST TECHNIQUE: Multidetector CT imaging of the chest was performed during intravenous contrast administration. CONTRAST:  75mL OMNIPAQUE IOHEXOL 300 MG/ML  SOLN COMPARISON:  Portable chest 1743 hours today.  Chest CT 02/21/2016. FINDINGS: Cardiovascular: Cardiac size is at the upper limits of normal. No pericardial effusion. Ectatic thoracic aorta with soft and calcified atherosclerosis. Calcified coronary artery atherosclerosis. Mediastinum/Nodes: Bulky superior mediastinal lymphadenopathy measuring 27 millimeter short axis (38 millimeters long axis) with smaller abnormal right paratracheal nodes. Mildly enlarged right hilar lymph node measuring 12 millimeters. No left hilar lymphadenopathy. Small gastric hiatal hernia. Lungs/Pleura: Chronic emphysema.  The major airways are patent. There are several right upper lobe lung nodules anteriorly. Each of the nodules appears somewhat spiculated and  has a morphology suggesting extension along the adjacent airways (coronal image 40. The largest lesion individually is up to 24 millimeters. Some of these appear to extend to the pleural surface. These are new since 2018. No associated pleural effusion or other new pulmonary nodule or opacity. Upper Abdomen: Cachectic. Stable visible upper abdominal viscera including prior cholecystectomy, left nephrolithiasis. Musculoskeletal: Chronic compression fractures of T2, T5, T7, T8, T10, T11, T12 and L1 appear stable since 2018. Interval moderate to severe T3 compression. Chronic but increased sternal fractures since 2018. No destructive osseous lesion identified. IMPRESSION: 1. Cachectic with Right Upper Lobe Lung Cancer suspected: several new spiculated upper lobe lung nodules since 2018 are associated with new and bulky superior mediastinal lymphadenopathy (nodes up to 3.8 cm). 2. Underlying Emphysema (ICD10-J43.9). Ectatic aorta with Atherosclerosis (ICD10-I70.0). 3. Increased since 2018 chronic spinal compression and sternal fractures which are probably benign/osteoporotic. No destructive osseous lesion is identified. Electronically Signed   By: Genevie Ann M.D.   On: 11/21/2017 20:27   Dg Chest Portable 1 View  Result Date: 11/21/2017 CLINICAL DATA:  Weakness. EXAM: PORTABLE CHEST 1 VIEW COMPARISON:  01/09/2017, 02/21/2016 and chest CT 02/21/2016 FINDINGS: Lungs are adequately inflated without focal airspace consolidation or effusion. 1.4 cm nodular density over the right apex projecting over the anterior right first rib end. Cardiac silhouette is within normal. Prominence of the right paratracheal soft tissues in the region of the azygos vein. Hiatal hernia. Remainder the exam is unchanged. IMPRESSION: No acute cardiopulmonary disease. 1.4 cm nodular density over the right apex. Prominent right paramediastinal density in the region of the azygos vein as adenopathy is possible. Recommend contrast-enhanced chest CT  for further evaluation. Electronically Signed   By: Marin Olp M.D.   On: 11/21/2017 18:15      Allergies as of 11/23/2017      Reactions   Oxycodone-acetaminophen Other (See Comments)   Hallucinations   Valium [diazepam]       Medication List    STOP taking these medications   albuterol 108 (90 Base) MCG/ACT inhaler Commonly known as:  PROVENTIL HFA;VENTOLIN HFA Replaced by:  albuterol (2.5 MG/3ML) 0.083% nebulizer solution   lidocaine 5 % Commonly known as:  LIDODERM   omeprazole 40 MG capsule Commonly known as:  PRILOSEC   oxyCODONE 5 MG immediate release tablet Commonly known as:  Oxy IR/ROXICODONE     TAKE these medications   acetaminophen 650 MG CR tablet Commonly known as:  TYLENOL Take 650 mg by mouth 3 (three) times daily.   albuterol (2.5 MG/3ML) 0.083% nebulizer solution Commonly known as:  PROVENTIL Inhale 3 mLs (2.5 mg total) into the lungs every 6 (six) hours as needed for wheezing or shortness of breath. Replaces:  albuterol 108 (90 Base) MCG/ACT inhaler   amLODipine 5 MG tablet Commonly known as:  NORVASC Take 1 tablet (5 mg total) by mouth daily. What changed:    medication strength  how much to take   feeding supplement (ENSURE ENLIVE) Liqd Take 237 mLs by mouth 2 (two) times daily between meals.   metoprolol tartrate 25 MG tablet Commonly known as:  LOPRESSOR Take 1 tablet (25 mg total) by mouth 2 (two) times daily.   mometasone-formoterol 200-5 MCG/ACT Aero Commonly known as:  DULERA Inhale 2 puffs into the lungs 2 (two) times daily.   nicotine 21 mg/24hr patch Commonly known as:  NICODERM CQ - dosed in mg/24 hours Place 1 patch (21 mg total) onto the skin daily. Start taking on:  11/24/2017          Management plans discussed with the patient and family and they are in agreement. Stable for discharge   Patient should follow up with oncology  CODE STATUS:     Code Status Orders  (From admission, onward)          Start     Ordered   11/22/17 1307  Do not attempt resuscitation (DNR)  Continuous    Question Answer Comment  In the event of cardiac or respiratory ARREST Do not call a "code blue"   In the event of cardiac or respiratory ARREST Do not perform Intubation, CPR, defibrillation or ACLS   In the event of cardiac or respiratory ARREST Use medication by any route, position, wound care, and other measures to relive pain and suffering. May use oxygen, suction and manual treatment of airway obstruction as needed for comfort.      11/22/17 1306        Code Status History    Date Active Date Inactive Code Status Order ID Comments User Context   11/22/2017 0022 11/22/2017 1306 Full Code [id number]  Amelia Jo, MD Inpatient   02/21/2016 2238 02/25/2016 1500 Full Code [id number]  Theodoro Grist, MD Inpatient   02/07/2016 1441 02/07/2016 2154 Full Code [id number]  Thornton Park, MD Inpatient   02/07/2016 0542 02/07/2016 1441 Full Code [id number]  Saundra Shelling, MD Inpatient      TOTAL TIME TAKING CARE OF THIS PATIENT: 38 minutes.    Note: This dictation was prepared with Dragon dictation along with smaller phrase technology. Any transcriptional errors that result from this process are unintentional.  Zach Tietje M.D on 11/23/2017 at 10:53 AM  Between 7am to 6pm - Pager - [phone] After 6pm go to [email or web address] - password [email or web address] [facility]  Office  [phone]  CC: Primary care physician; Venia Carbon, MD

## 2017-11-24 DIAGNOSIS — R531 Weakness: Secondary | ICD-10-CM | POA: Diagnosis not present

## 2017-11-24 DIAGNOSIS — Z7952 Long term (current) use of systemic steroids: Secondary | ICD-10-CM | POA: Diagnosis not present

## 2017-11-24 DIAGNOSIS — E46 Unspecified protein-calorie malnutrition: Secondary | ICD-10-CM | POA: Diagnosis not present

## 2017-11-24 DIAGNOSIS — C3491 Malignant neoplasm of unspecified part of right bronchus or lung: Secondary | ICD-10-CM | POA: Diagnosis not present

## 2017-11-24 DIAGNOSIS — M549 Dorsalgia, unspecified: Secondary | ICD-10-CM | POA: Diagnosis not present

## 2017-11-24 DIAGNOSIS — E43 Unspecified severe protein-calorie malnutrition: Secondary | ICD-10-CM | POA: Diagnosis not present

## 2017-11-24 DIAGNOSIS — M6281 Muscle weakness (generalized): Secondary | ICD-10-CM | POA: Diagnosis not present

## 2017-11-24 DIAGNOSIS — E876 Hypokalemia: Secondary | ICD-10-CM | POA: Diagnosis not present

## 2017-11-24 DIAGNOSIS — J449 Chronic obstructive pulmonary disease, unspecified: Secondary | ICD-10-CM | POA: Diagnosis not present

## 2017-11-24 DIAGNOSIS — Z716 Tobacco abuse counseling: Secondary | ICD-10-CM | POA: Diagnosis not present

## 2017-11-24 DIAGNOSIS — W19XXXA Unspecified fall, initial encounter: Secondary | ICD-10-CM | POA: Diagnosis not present

## 2017-11-24 DIAGNOSIS — R112 Nausea with vomiting, unspecified: Secondary | ICD-10-CM | POA: Diagnosis not present

## 2017-11-24 DIAGNOSIS — F17210 Nicotine dependence, cigarettes, uncomplicated: Secondary | ICD-10-CM | POA: Diagnosis not present

## 2017-11-24 DIAGNOSIS — Z720 Tobacco use: Secondary | ICD-10-CM | POA: Diagnosis not present

## 2017-11-24 DIAGNOSIS — J439 Emphysema, unspecified: Secondary | ICD-10-CM | POA: Diagnosis not present

## 2017-11-24 DIAGNOSIS — R5381 Other malaise: Secondary | ICD-10-CM | POA: Diagnosis not present

## 2017-11-24 DIAGNOSIS — I10 Essential (primary) hypertension: Secondary | ICD-10-CM | POA: Diagnosis not present

## 2017-11-24 DIAGNOSIS — R918 Other nonspecific abnormal finding of lung field: Secondary | ICD-10-CM | POA: Diagnosis not present

## 2017-11-24 DIAGNOSIS — G501 Atypical facial pain: Secondary | ICD-10-CM | POA: Diagnosis not present

## 2017-11-24 DIAGNOSIS — R5383 Other fatigue: Secondary | ICD-10-CM | POA: Diagnosis not present

## 2017-11-24 DIAGNOSIS — I2510 Atherosclerotic heart disease of native coronary artery without angina pectoris: Secondary | ICD-10-CM | POA: Diagnosis not present

## 2017-11-24 DIAGNOSIS — G8929 Other chronic pain: Secondary | ICD-10-CM | POA: Diagnosis not present

## 2017-11-24 DIAGNOSIS — I700 Atherosclerosis of aorta: Secondary | ICD-10-CM | POA: Diagnosis not present

## 2017-11-24 DIAGNOSIS — M25551 Pain in right hip: Secondary | ICD-10-CM | POA: Diagnosis not present

## 2017-11-24 DIAGNOSIS — Z79899 Other long term (current) drug therapy: Secondary | ICD-10-CM | POA: Diagnosis not present

## 2017-11-24 DIAGNOSIS — Z23 Encounter for immunization: Secondary | ICD-10-CM | POA: Diagnosis not present

## 2017-11-24 DIAGNOSIS — Z66 Do not resuscitate: Secondary | ICD-10-CM | POA: Diagnosis not present

## 2017-11-24 DIAGNOSIS — R1312 Dysphagia, oropharyngeal phase: Secondary | ICD-10-CM | POA: Diagnosis not present

## 2017-11-24 DIAGNOSIS — M810 Age-related osteoporosis without current pathological fracture: Secondary | ICD-10-CM | POA: Diagnosis not present

## 2017-11-24 DIAGNOSIS — M1990 Unspecified osteoarthritis, unspecified site: Secondary | ICD-10-CM | POA: Diagnosis not present

## 2017-11-24 DIAGNOSIS — R634 Abnormal weight loss: Secondary | ICD-10-CM | POA: Diagnosis not present

## 2017-11-24 DIAGNOSIS — Z7401 Bed confinement status: Secondary | ICD-10-CM | POA: Diagnosis not present

## 2017-11-24 LAB — MAGNESIUM: MAGNESIUM: 1.9 mg/dL (ref 1.7–2.4)

## 2017-11-24 LAB — BASIC METABOLIC PANEL
ANION GAP: 3 — AB (ref 5–15)
BUN: 11 mg/dL (ref 8–23)
CHLORIDE: 109 mmol/L (ref 98–111)
CO2: 26 mmol/L (ref 22–32)
Calcium: 7.7 mg/dL — ABNORMAL LOW (ref 8.9–10.3)
Creatinine, Ser: 0.45 mg/dL (ref 0.44–1.00)
GFR calc Af Amer: >60 mL/min (ref >60)
Glucose, Bld: 74 mg/dL (ref 70–99)
POTASSIUM: 4.5 mmol/L (ref 3.5–5.1)
Sodium: 138 mmol/L (ref 135–145)

## 2017-11-24 LAB — PHOSPHORUS: PHOSPHORUS: 2.6 mg/dL (ref 2.5–4.6)

## 2017-11-24 LAB — GLUCOSE, CAPILLARY: Glucose-Capillary: 70 mg/dL (ref 70–99)

## 2017-11-24 NOTE — Progress Notes (Signed)
Pharmacy Electrolyte Monitoring Consult:  Pharmacy consulted to assist in monitoring and replacing electrolytes in this 72 y.o. female admitted on 11/21/2017 with Weight Loss ([employer] to [employer]). She is likely at high refeeding risk, therefore pharmacy will monitor K, Mg and P labs daily until stable.    Labs:   Potassium (mmol/L)  Date Value  11/24/2017 4.5   Magnesium (mg/dL)  Date Value  11/24/2017 1.9   Phosphorus (mg/dL)  Date Value  11/24/2017 2.6   Calcium (mg/dL)  Date Value  11/24/2017 7.7 (L)   Albumin (g/dL)  Date Value  11/21/2017 2.5 (L)  ] Assessment/Plan: 10/24 0755 K 2.4 mmol/L, Mg 1.6mg /dL, phosphorous wnl, corrected calcium equals 8.9 mg/dL. Patient subsequently was administered KCl 10 mEq IV x 4 and 2 grams IV magnesium sulfate  10/24 1421 K 3.6 mmol/L: no further supplementation required  10/25 0536: K and phosphorous low. Replacement therapy on 10/24 of 40 mEq IV raised K from 2.4 to 3.6. Will order 2 runs IV KCl (77mEq total) and K-phos original 2 tabs TIDWC x2  10/26- Labs WNL. No replacement at this time.  Will recheck electrolytes w/ am labs and replace if needed.  Chinita Greenland PharmD Clinical Pharmacist 11/24/2017

## 2017-11-24 NOTE — Progress Notes (Signed)
[place] at [place] NAME: Autumn Pruitt    MR#:  [id number]  DATE OF BIRTH:  1945-11-21  SUBJECTIVE:   Patient feeling weak this am    REVIEW OF SYSTEMS:    Review of Systems  Constitutional: Negative for fever, chills ++weight loss HENT: Negative for ear pain, nosebleeds, congestion, facial swelling, rhinorrhea, neck pain, neck stiffness and ear discharge.   Respiratory: Negative for cough, shortness of breath, wheezing  Cardiovascular: Negative for chest pain, palpitations and leg swelling.  Gastrointestinal: Negative for heartburn, abdominal pain, vomiting, diarrhea or consitpation Genitourinary: Negative for dysuria, urgency, frequency, hematuria Musculoskeletal: Negative for back pain or joint pain Neurological: Negative for dizziness, seizures, syncope, focal weakness,  numbness and headaches.  Hematological: Does not bruise/bleed easily.  Psychiatric/Behavioral: Negative for hallucinations, confusion, dysphoric mood    Tolerating Diet: yes      DRUG ALLERGIES:   Allergies  Allergen Reactions  . Oxycodone-Acetaminophen Other (See Comments)    Hallucinations  . Valium [Diazepam]     VITALS:  Blood pressure (!) 160/79, pulse 74, temperature 97.6 F (36.4 C), temperature source Oral, resp. rate 20, height 4\' 11"  (1.499 m), weight 33.6 kg, SpO2 100 %.  PHYSICAL EXAMINATION:  Constitutional: Appears frail thin. No distress. HENT: Normocephalic. Marland Kitchen Oropharynx is clear and moist.  Eyes: Conjunctivae and EOM are normal. PERRLA, no scleral icterus.  Neck: Normal ROM. Neck supple. No JVD. No tracheal deviation. CVS: RRR, S1/S2 +, no murmurs, no gallops, no carotid bruit.  Pulmonary: Effort and breath sounds normal, no stridor, rhonchi, wheezes, rales.  Abdominal: Soft. BS +,  no distension, tenderness, rebound or guarding.  Musculoskeletal: Normal range of motion. No edema and no tenderness.  Neuro: Alert. CN 2-12 grossly  intact. No focal deficits. Skin: Skin is warm and dry. No rash noted. Psychiatric: Normal mood and affect.      LABORATORY PANEL:   CBC Recent Labs  Lab 11/22/17 0755  WBC 5.6  HGB 10.9*  HCT 33.4*  PLT 267   ------------------------------------------------------------------------------------------------------------------  Chemistries  Recent Labs  Lab 11/21/17 1806  11/23/17 0536  NA 140   < > 139  K 3.0*   < > 3.0*  CL 100   < > 105  CO2 31   < > 28  GLUCOSE 180*   < > 91  BUN 23   < > 13  CREATININE 0.62   < > 0.52  CALCIUM 8.5*   < > 7.9*  MG  --    < > 2.4  AST 34  --   --   ALT 16  --   --   ALKPHOS 121  --   --   BILITOT 0.6  --   --    < > = values in this interval not displayed.   ------------------------------------------------------------------------------------------------------------------  Cardiac Enzymes No results for input(s): TROPONINI in the last 168 hours. ------------------------------------------------------------------------------------------------------------------  RADIOLOGY:  No results found.   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   1. 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  Recommendations are to optimize COPD and follow patient up as an outpatient with pulmonologist and oncologist for possible bronchoscopy/biopsy.  Patient has very poor performance and cachexia and may be poor candidate for palliative chemoradiation as per oncology.  Plan right now is to follow-up with the patient in 1 week for further discussion of plan.  Patient was evaluated by the palliative care services while in the hospital.  She will need to continue outpatient palliative care services.    2. Tobacco dependence: Patient is encouraged to quit smoking. Counseling  was provided . She will be discharged on nicotine patch3. Hypokalemia/ hypomagnesia from poor nutrition: Replace and recheck in am.  4. Weight loss with severe protein calorie malnutrition We will continue nutritional supplements  5. Essential KLK:JZPHXTAV metoprolol and increase dose of norvasc.  6. COPD: no signs of exacerbation. She will have outpatient follow-up with pulmonary  She will need outpatient palliative care service upon discharge  She needs good discharge plan not safe to go home as no one can care for this elderly frail women        Management plans discussed with the patient and she is in agreement.  CODE STATUS: dnr  TOTAL TIME TAKING CARE OF THIS PATIENT: *27 minutes.     POSSIBLE D/C tomorrow, DEPENDING ON CLINICAL CONDITION.   Liron Eissler M.D on 11/24/2017 at 8:10 AM  Between 7am to 6pm - Pager - [phone] After 6pm go to [email or web address] - password [email or web address] [facility]  Office  [phone]  CC: Primary care physician; Venia Carbon, MD  Note: This dictation was prepared with Dragon dictation along with smaller phrase technology. Any transcriptional errors that result from this process are unintentional.

## 2017-11-24 NOTE — Clinical Social Work Note (Signed)
The patient will discharge today to [facility] via non-emergent EMS. The patient and the facility are aware, and the CSW has left a HIPPA compliant voicemail for the patient's daughter-in-law, [name]. The CSW has sent all pertinent documentation to the facility, and the discharge packet has been delivered including the DNR. The CSW is signing off. Please consult should needs arise.  Santiago Bumpers, MSW, Latanya Presser [phone]

## 2017-11-24 NOTE — Progress Notes (Signed)
Discharge order received. Patient is alert and oriented. Vital signs stable . No signs of acute distress. Discharge instructions given to pt. Patient verbalized understanding. No other issues noted at this time. Report given to Joneen Caraway at [facility].

## 2017-11-26 ENCOUNTER — Telehealth: Payer: Self-pay | Admitting: Internal Medicine

## 2017-11-26 NOTE — Telephone Encounter (Signed)
Message sent to scheduling to schedule pt for follow up. Will notify pulmonary to schedule follow up with them as well. Thx!

## 2017-11-26 NOTE — Telephone Encounter (Signed)
Spoke with Morris on DPR re: [facility]fu appt.  Scheduled. 11/1 with Kasa.    Per request please call to discuss dc instructions / dx / care plan/ specifically 02 need or cancel.  Patient daughter states hospital dc instructions not clear and she is trying to figure things out.   Please call beth's cell .

## 2017-11-26 NOTE — Telephone Encounter (Signed)
Spoke to [facility] regarding hospital discharge. I was unable to answer her questions regarding discharge notes, but did give her the number for Greig Right, Craig who took over the discharge case. Nothing further needed at this time.

## 2017-11-27 DIAGNOSIS — I10 Essential (primary) hypertension: Secondary | ICD-10-CM | POA: Diagnosis not present

## 2017-11-27 DIAGNOSIS — E46 Unspecified protein-calorie malnutrition: Secondary | ICD-10-CM | POA: Diagnosis not present

## 2017-11-27 DIAGNOSIS — J449 Chronic obstructive pulmonary disease, unspecified: Secondary | ICD-10-CM | POA: Diagnosis not present

## 2017-11-27 DIAGNOSIS — C3491 Malignant neoplasm of unspecified part of right bronchus or lung: Secondary | ICD-10-CM | POA: Diagnosis not present

## 2017-11-30 ENCOUNTER — Encounter: Payer: Self-pay | Admitting: Internal Medicine

## 2017-11-30 ENCOUNTER — Ambulatory Visit (INDEPENDENT_AMBULATORY_CARE_PROVIDER_SITE_OTHER): Payer: Medicare Other | Admitting: Internal Medicine

## 2017-11-30 ENCOUNTER — Inpatient Hospital Stay: Payer: Medicare Other | Attending: Internal Medicine | Admitting: Internal Medicine

## 2017-11-30 VITALS — BP 139/64 | HR 87 | Resp 16

## 2017-11-30 VITALS — BP 90/60 | HR 95 | Ht 59.0 in

## 2017-11-30 DIAGNOSIS — J449 Chronic obstructive pulmonary disease, unspecified: Secondary | ICD-10-CM | POA: Insufficient documentation

## 2017-11-30 DIAGNOSIS — R918 Other nonspecific abnormal finding of lung field: Secondary | ICD-10-CM

## 2017-11-30 DIAGNOSIS — R5381 Other malaise: Secondary | ICD-10-CM | POA: Diagnosis not present

## 2017-11-30 DIAGNOSIS — R634 Abnormal weight loss: Secondary | ICD-10-CM | POA: Diagnosis not present

## 2017-11-30 DIAGNOSIS — M1990 Unspecified osteoarthritis, unspecified site: Secondary | ICD-10-CM

## 2017-11-30 DIAGNOSIS — R112 Nausea with vomiting, unspecified: Secondary | ICD-10-CM | POA: Insufficient documentation

## 2017-11-30 DIAGNOSIS — R5383 Other fatigue: Secondary | ICD-10-CM | POA: Diagnosis not present

## 2017-11-30 DIAGNOSIS — R531 Weakness: Secondary | ICD-10-CM | POA: Diagnosis not present

## 2017-11-30 DIAGNOSIS — F17210 Nicotine dependence, cigarettes, uncomplicated: Secondary | ICD-10-CM | POA: Diagnosis not present

## 2017-11-30 DIAGNOSIS — M810 Age-related osteoporosis without current pathological fracture: Secondary | ICD-10-CM | POA: Insufficient documentation

## 2017-11-30 DIAGNOSIS — I10 Essential (primary) hypertension: Secondary | ICD-10-CM | POA: Diagnosis not present

## 2017-11-30 DIAGNOSIS — Z66 Do not resuscitate: Secondary | ICD-10-CM | POA: Insufficient documentation

## 2017-11-30 DIAGNOSIS — M549 Dorsalgia, unspecified: Secondary | ICD-10-CM | POA: Insufficient documentation

## 2017-11-30 DIAGNOSIS — G8929 Other chronic pain: Secondary | ICD-10-CM

## 2017-11-30 DIAGNOSIS — Z79899 Other long term (current) drug therapy: Secondary | ICD-10-CM

## 2017-11-30 DIAGNOSIS — I2510 Atherosclerotic heart disease of native coronary artery without angina pectoris: Secondary | ICD-10-CM | POA: Insufficient documentation

## 2017-11-30 DIAGNOSIS — Z7952 Long term (current) use of systemic steroids: Secondary | ICD-10-CM

## 2017-11-30 MED ORDER — PREDNISONE 20 MG PO TABS: 20.0000 mg | ORAL_TABLET | Freq: Every day | ORAL | 0 refills | Status: AC

## 2017-11-30 MED ORDER — ONDANSETRON HCL 4 MG PO TABS: 4.0000 mg | ORAL_TABLET | Freq: Three times a day (TID) | ORAL | 0 refills | Status: AC | PRN

## 2017-11-30 MED ORDER — TRAMADOL HCL 50 MG PO TABS: 50.0000 mg | ORAL_TABLET | Freq: Three times a day (TID) | ORAL | 0 refills | Status: AC | PRN

## 2017-11-30 NOTE — Patient Instructions (Signed)
Follow up in 3 months

## 2017-11-30 NOTE — Progress Notes (Signed)
Name: Melissa Watkins MRN: [id number] DOB: October 05, 1945     CONSULTATION DATE: 11.1.19 REFERRING MD : shah  CHIEF COMPLAINT: abnormal CT chest  STUDIES:      10.23.19 CT chest Independently reviewed by Me today spiculated RUL nodule likely lung cancer  HISTORY OF PRESENT ILLNESS:  72 yo pleasant white female with recent admission for COPD exacerbation Patient found to have RUL lung nodules/mass  Patient is very cachectic and frail Has lost a significant amount of weight over the last 6 months Approximately 60 pound weight loss  Patient is malnourished does not eat much She continues to smoke  CT chest is consistent with primary lung cancer She also has back pain which could be related to her cancer which is undiagnosed at this time  After discussion with oncology patient is a very poor performance status in the setting of severe malnutrition With progressive decline in her physical function Patient is a poor candidate for chemo or radiation therapy  I have explained this to the patient and the family member past who is the daughter-in-law They are all in agreement and therefore pursuing a biopsy via ENB is not ideal at this time  I recommended hospice referral  No acute signs of infection at this time No respiratory distress at  this time  No signs of heart failure   PAST MEDICAL HISTORY :   has a past medical history of Back pain, chronic, COPD (chronic obstructive pulmonary disease) ([place]), Duodenal ulcer (01/2016), Essential hypertension, benign, Essential hypertension, benign, Osteoarthritis, and Osteoporosis.  has a past surgical history that includes Cholecystectomy; Oophorectomy; Colonoscopy; Hip Arthroplasty (Right, 02/07/2016); Esophagogastroduodenoscopy (egd) with propofol (N/A, 02/24/2016); Hip fracture surgery (Right, 01/2016); Abdominal hysterectomy; and Esophagogastroduodenoscopy (egd) with propofol (N/A, 06/08/2016). Prior to Admission medications     Medication Sig Start Date End Date Taking? Authorizing Provider  acetaminophen (TYLENOL) 650 MG CR tablet Take 650 mg by mouth 3 (three) times daily.   Yes [provider]  albuterol (PROVENTIL) (2.5 MG/3ML) 0.083% nebulizer solution Inhale 3 mLs (2.5 mg total) into the lungs every 6 (six) hours as needed for wheezing or shortness of breath. 11/23/17  Yes Mody, Ulice Bold, MD  amLODipine (NORVASC) 5 MG tablet Take 1 tablet (5 mg total) by mouth daily. 11/23/17  Yes Mody, Ulice Bold, MD  feeding supplement, ENSURE ENLIVE, (ENSURE ENLIVE) LIQD Take 237 mLs by mouth 2 (two) times daily between meals. 11/23/17  Yes Bettey Costa, MD  metoprolol tartrate (LOPRESSOR) 25 MG tablet Take 1 tablet (25 mg total) by mouth 2 (two) times daily. 11/23/17  Yes Mody, Ulice Bold, MD  mometasone-formoterol (DULERA) 200-5 MCG/ACT AERO Inhale 2 puffs into the lungs 2 (two) times daily. 11/23/17 12/24/17 Yes Mody, Ulice Bold, MD  nicotine (NICODERM CQ - DOSED IN MG/24 HOURS) 21 mg/24hr patch Place 1 patch (21 mg total) onto the skin daily. 11/24/17  Yes Bettey Costa, MD   Allergies  Allergen Reactions  . Oxycodone-Acetaminophen Other (See Comments)    Hallucinations  . Valium [Diazepam]     FAMILY HISTORY:  family history includes Asthma in her son; COPD in her father; Cancer in her mother. SOCIAL HISTORY:  reports that she has been smoking cigarettes. She has a 50.00 pack-year smoking history. She has never used smokeless tobacco. She reports that she does not drink alcohol or use drugs.  REVIEW OF SYSTEMS:   Constitutional: Negative for fever, chills, weight loss, malaise/fatigue and diaphoresis.  HENT: Negative for hearing loss, ear pain, nosebleeds, congestion, sore throat,  neck pain, tinnitus and ear discharge.   Eyes: Negative for blurred vision, double vision, photophobia, pain, discharge and redness.  Respiratory:Negative for cough,-hemoptysis,-sputum production, + shortness of breath,-wheezing and stridor.    Cardiovascular: Negative for chest pain, palpitations, orthopnea, claudication, leg swelling and PND.  Gastrointestinal: Negative for heartburn, nausea, vomiting, abdominal pain, diarrhea, constipation, blood in stool and melena.  Genitourinary: Negative for dysuria, urgency, frequency, hematuria and flank pain.  Musculoskeletal: Negative for myalgias, back pain, joint pain and falls.  Skin: Negative for itching and rash.  Neurological: Negative for dizziness, tingling, tremors, sensory change, speech change, focal weakness, seizures, loss of consciousness, weakness and headaches.  Endo/Heme/Allergies: Negative for environmental allergies and polydipsia. Does not bruise/bleed easily.  ALL OTHER ROS ARE NEGATIVE   BP 90/60 (BP Location: Left Arm, Cuff Size: Normal)   Pulse 95   Ht 4\' 11"  (1.499 m)   SpO2 94%   BMI 14.95 kg/m    Physical Examination:   GENERAL:NAD, no fevers, chills, no weakness no fatigue HEAD: Normocephalic, atraumatic.  EYES: Pupils equal, round, reactive to light. Extraocular muscles intact. No scleral icterus.  MOUTH: Moist mucosal membrane.   EAR, NOSE, THROAT: Clear without exudates. No external lesions.  NECK: Supple. No thyromegaly. No nodules. No JVD.  PULMONARY:CTA B/L no wheezes, no crackles, no rhonchi CARDIOVASCULAR: S1 and S2. Regular rate and rhythm. No murmurs, rubs, or gallops. No edema.  GASTROINTESTINAL: Soft, nontender, nondistended. No masses. Positive bowel sounds.  MUSCULOSKELETAL: No swelling, clubbing, or edema. Range of motion full in all extremities.  NEUROLOGIC: Cranial nerves II through XII are intact. No gross focal neurological deficits.  SKIN: No ulceration, lesions, rashes, or cyanosis. Skin warm and dry. Turgor intact.  PSYCHIATRIC: Mood, affect within normal limits. The patient is awake, alert and oriented x 3. Insight, judgment intact.      ASSESSMENT / PLAN: 72 year old pleasant white female thin frail with poor performance  status with significant malnutrition in the setting of chronic tobacco abuse with underlying COPD with a right upper lobe lung mass consistent with primary lung cancer with underlying back pain which is probably consistent with metastatic disease  Overall her prognosis is poor, I recommended to the patient and family that referral to hospice is very appropriate at this time She is not a candidate for chemoradiation so therefore it is not a candidate for ENB procedure and is at high risk for cardiac arrest and death  At this time I would recommend that she get albuterol nebs every 4 hours Patient continues to smoke 1 to 2 cigarettes/day  Continue supportive care    Patient/Family are satisfied with Plan of action and management. All questions answered Follow-up in 3 months   Vinessa Macconnell Patricia Pesa, M.D.  [facility]  Medical Director [place] Director [facility] Cardio-Pulmonary Department

## 2017-11-30 NOTE — Progress Notes (Signed)
[place] CONSULT NOTE  Patient Care Team: Venia Carbon, MD as PCP - General (Internal Medicine)  CHIEF COMPLAINTS/PURPOSE OF CONSULTATION:  Lung mass  # RIGHT UPPER LOBE LUNG MASS/ MEDIASTINAL LN  # severe COPD/ frailty/ severe arthritis/ SNF    No history exists.     HISTORY OF PRESENTING ILLNESS:  Melissa Watkins 72 y.o.  female with a history of severe arthritis/severe COPD was recently admitted the hospital after found in poor hygienic surroundings at home.  On further work-up in the hospital patient was noted to have right upper lobe lung nodule/mass along with bulky mediastinal adenopathy highly suspicious of malignancy.  Patient was discharged subsequent follow-up with pulmonary.  Patient was evaluated pulmonary today felt patient is a poor candidate for any further interventions.  Patient continues to have weight loss.  Patient complains of intermittent nausea; intermittent vomiting..  Denies any headaches.  Chronic joint pains.  Chronic back pain.  Review of Systems  Constitutional: Positive for malaise/fatigue and weight loss. Negative for chills, diaphoresis and fever.  HENT: Negative for nosebleeds and sore throat.   Eyes: Negative for double vision.  Respiratory: Positive for cough, sputum production, shortness of breath and wheezing. Negative for hemoptysis.   Cardiovascular: Negative for chest pain, palpitations, orthopnea and leg swelling.  Gastrointestinal: Positive for nausea and vomiting. Negative for abdominal pain, blood in stool, constipation, diarrhea, heartburn and melena.  Genitourinary: Negative for dysuria, frequency and urgency.  Musculoskeletal: Positive for back pain. Negative for joint pain.  Skin: Negative.  Negative for itching and rash.  Neurological: Negative for dizziness, tingling, focal weakness, weakness and headaches.  Endo/Heme/Allergies: Does not bruise/bleed easily.  Psychiatric/Behavioral: Negative for  depression. The patient is not nervous/anxious and does not have insomnia.      MEDICAL HISTORY:  Past Medical History:  Diagnosis Date  . Back pain, chronic   . COPD (chronic obstructive pulmonary disease) ([place])   . Duodenal ulcer 01/2016   and esophagitis (hospitalized)  . Essential hypertension, benign   . Essential hypertension, benign   . Osteoarthritis   . Osteoporosis     SURGICAL HISTORY: Past Surgical History:  Procedure Laterality Date  . ABDOMINAL HYSTERECTOMY    . CHOLECYSTECTOMY    . COLONOSCOPY    . ESOPHAGOGASTRODUODENOSCOPY (EGD) WITH PROPOFOL N/A 02/24/2016   Procedure: ESOPHAGOGASTRODUODENOSCOPY (EGD) WITH PROPOFOL;  Surgeon: Jonathon Bellows, MD;  Location: [facility] ENDOSCOPY;  Service: Endoscopy;  Laterality: N/A;  . ESOPHAGOGASTRODUODENOSCOPY (EGD) WITH PROPOFOL N/A 06/08/2016   Procedure: ESOPHAGOGASTRODUODENOSCOPY (EGD) WITH PROPOFOL;  Surgeon: Jonathon Bellows, MD;  Location: [facility] ENDOSCOPY;  Service: Endoscopy;  Laterality: N/A;  . HIP ARTHROPLASTY Right 02/07/2016   Procedure: ARTHROPLASTY BIPOLAR HIP (HEMIARTHROPLASTY);  Surgeon: Thornton Park, MD;  Location: [facility] ORS;  Service: Orthopedics;  Laterality: Right;  . HIP FRACTURE SURGERY Right 01/2016  . OOPHORECTOMY      SOCIAL HISTORY: Social History   Socioeconomic History  . Marital status: Widowed    Spouse name: Not on file  . Number of children: 4  . Years of education: Not on file  . Highest education level: Not on file  Occupational History  . 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 Financial resource strain: Not on file  . Food insecurity:    Worry: Not on file    Inability: Not on file  . Transportation needs:    Medical: Not on file    Non-medical: Not on file  Tobacco  Use  . Smoking status: Current Every Day Smoker    Packs/day: 1.00    Years: 50.00    Pack years: 50.00    Types: Cigarettes    Last attempt to quit: 05/17/2016    Years since  quitting: 1.5  . Smokeless tobacco: Never Used  . Tobacco comment: smokes approximately 5 cigarettes a day  Substance and Sexual Activity  . Alcohol use: No    Comment: occasional  . Drug use: No  . Sexual activity: Not on file  Lifestyle  . Physical activity:    Days per week: Not on file    Minutes per session: Not on file  . Stress: Not on file  Relationships  . Social connections:    Talks on phone: Not on file    Gets together: Not on file    Attends religious service: Not on file    Active member of club or organization: Not on file    Attends meetings of clubs or organizations: Not on file    Relationship status: Not on file  . Intimate partner violence:    Fear of current or ex partner: Not on file    Emotionally abused: Not on file    Physically abused: Not on file    Forced sexual activity: Not on file  Other Topics Concern  . Not on file  Social History Narrative   No living will   4 children (2 of each). Son lives with her   DNR done 02/25/16 ("if it is time to go, it is time to go")    FAMILY HISTORY: Family History  Problem Relation Age of Onset  . Cancer Mother   . COPD Father   . Asthma Son   . Prostate cancer Neg Hx   . Kidney cancer Neg Hx   . Bladder Cancer Neg Hx     ALLERGIES:  is allergic to oxycodone-acetaminophen and valium [diazepam].  MEDICATIONS:  Current Outpatient Medications  Medication Sig Dispense Refill  . amLODipine (NORVASC) 5 MG tablet Take 1 tablet (5 mg total) by mouth daily. 30 tablet 0  . feeding supplement, ENSURE ENLIVE, (ENSURE ENLIVE) LIQD Take 237 mLs by mouth 2 (two) times daily between meals. 237 mL 12  . metoprolol tartrate (LOPRESSOR) 25 MG tablet Take 1 tablet (25 mg total) by mouth 2 (two) times daily. 60 tablet 0  . mometasone-formoterol (DULERA) 200-5 MCG/ACT AERO Inhale 2 puffs into the lungs 2 (two) times daily. 1 Inhaler 0  . nicotine (NICODERM CQ - DOSED IN MG/24 HOURS) 21 mg/24hr patch Place 1 patch (21 mg  total) onto the skin daily. 28 patch 0  . acetaminophen (TYLENOL) 650 MG CR tablet Take 650 mg by mouth 3 (three) times daily.    Marland Kitchen albuterol (PROVENTIL) (2.5 MG/3ML) 0.083% nebulizer solution Inhale 3 mLs (2.5 mg total) into the lungs every 6 (six) hours as needed for wheezing or shortness of breath. (Patient not taking: Reported on 11/30/2017) 75 mL 12  . ondansetron (ZOFRAN) 4 MG tablet Take 1 tablet (4 mg total) by mouth every 8 (eight) hours as needed for nausea or vomiting. 20 tablet 0  . predniSONE (DELTASONE) 20 MG tablet Take 1 tablet (20 mg total) by mouth daily with breakfast. Once a day with food x 14 days. 14 tablet 0  . traMADol (ULTRAM) 50 MG tablet Take 1 tablet (50 mg total) by mouth every 8 (eight) hours as needed. 45 tablet 0   No current facility-administered medications for  this visit.       Marland Kitchen  PHYSICAL EXAMINATION: ECOG PERFORMANCE STATUS: 3 - Symptomatic, >50% confined to bed  Vitals:   11/30/17 1142  BP: 139/64  Pulse: 87  Resp: 16   There were no vitals filed for this visit.  Physical Exam  Constitutional: She is oriented to person, place, and time.  Frail-appearing Caucasian female patient.  She is in a wheelchair.  She appears much older than his stated age.  She is accompanied by her daughter-in-law.  HENT:  Head: Normocephalic and atraumatic.  Mouth/Throat: Oropharynx is clear and moist. No oropharyngeal exudate.  Eyes: Pupils are equal, round, and reactive to light.  Neck: Normal range of motion. Neck supple.  Cardiovascular: Normal rate and regular rhythm.  Pulmonary/Chest: No respiratory distress. She has no wheezes.  Decreased air entry bilaterally.  Abdominal: Soft. Bowel sounds are normal. She exhibits no distension and no mass. There is no tenderness. There is no rebound and no guarding.  Musculoskeletal: Normal range of motion. She exhibits no edema or tenderness.  Neurological: She is alert and oriented to person, place, and time.  Skin: Skin  is warm.  Psychiatric: Affect normal.     LABORATORY DATA:  I have reviewed the data as listed Lab Results  Component Value Date   WBC 5.6 11/22/2017   HGB 10.9 (L) 11/22/2017   HCT 33.4 (L) 11/22/2017   MCV 90.5 11/22/2017   PLT 267 11/22/2017   Recent Labs    11/21/17 1806 11/22/17 0755 11/22/17 1421 11/23/17 0536 11/24/17 0742  NA 140 139  --  139 138  K 3.0* 2.4* 3.6 3.0* 4.5  CL 100 102  --  105 109  CO2 31 31  --  28 26  GLUCOSE 180* 77  --  91 74  BUN 23 17  --  13 11  CREATININE 0.62 0.67  --  0.52 0.45  CALCIUM 8.5* 7.7*  --  7.9* 7.7*  GFRNONAA >60 >60  --  >60 >60  GFRAA >60 >60  --  >60 >60  PROT 6.1*  --   --   --   --   ALBUMIN 2.5*  --   --   --   --   AST 34  --   --   --   --   ALT 16  --   --   --   --   ALKPHOS 121  --   --   --   --   BILITOT 0.6  --   --   --   --     RADIOGRAPHIC STUDIES: I have personally reviewed the radiological images as listed and agreed with the findings in the report. Ct Chest W Contrast  Result Date: 11/21/2017 CLINICAL DATA:  72 year old female with right upper lobe nodule on portable chest radiograph earlier today. Weakness. EXAM: CT CHEST WITH CONTRAST TECHNIQUE: Multidetector CT imaging of the chest was performed during intravenous contrast administration. CONTRAST:  79mL OMNIPAQUE IOHEXOL 300 MG/ML  SOLN COMPARISON:  Portable chest 1743 hours today.  Chest CT 02/21/2016. FINDINGS: Cardiovascular: Cardiac size is at the upper limits of normal. No pericardial effusion. Ectatic thoracic aorta with soft and calcified atherosclerosis. Calcified coronary artery atherosclerosis. Mediastinum/Nodes: Bulky superior mediastinal lymphadenopathy measuring 27 millimeter short axis (38 millimeters long axis) with smaller abnormal right paratracheal nodes. Mildly enlarged right hilar lymph node measuring 12 millimeters. No left hilar lymphadenopathy. Small gastric hiatal hernia. Lungs/Pleura: Chronic emphysema.  The major airways are  patent. There are several right upper lobe lung nodules anteriorly. Each of the nodules appears somewhat spiculated and has a morphology suggesting extension along the adjacent airways (coronal image 40. The largest lesion individually is up to 24 millimeters. Some of these appear to extend to the pleural surface. These are new since 2018. No associated pleural effusion or other new pulmonary nodule or opacity. Upper Abdomen: Cachectic. Stable visible upper abdominal viscera including prior cholecystectomy, left nephrolithiasis. Musculoskeletal: Chronic compression fractures of T2, T5, T7, T8, T10, T11, T12 and L1 appear stable since 2018. Interval moderate to severe T3 compression. Chronic but increased sternal fractures since 2018. No destructive osseous lesion identified. IMPRESSION: 1. Cachectic with Right Upper Lobe Lung Cancer suspected: several new spiculated upper lobe lung nodules since 2018 are associated with new and bulky superior mediastinal lymphadenopathy (nodes up to 3.8 cm). 2. Underlying Emphysema (ICD10-J43.9). Ectatic aorta with Atherosclerosis (ICD10-I70.0). 3. Increased since 2018 chronic spinal compression and sternal fractures which are probably benign/osteoporotic. No destructive osseous lesion is identified. Electronically Signed   By: Genevie Ann M.D.   On: 11/21/2017 20:27   Dg Chest Portable 1 View  Result Date: 11/21/2017 CLINICAL DATA:  Weakness. EXAM: PORTABLE CHEST 1 VIEW COMPARISON:  01/09/2017, 02/21/2016 and chest CT 02/21/2016 FINDINGS: Lungs are adequately inflated without focal airspace consolidation or effusion. 1.4 cm nodular density over the right apex projecting over the anterior right first rib end. Cardiac silhouette is within normal. Prominence of the right paratracheal soft tissues in the region of the azygos vein. Hiatal hernia. Remainder the exam is unchanged. IMPRESSION: No acute cardiopulmonary disease. 1.4 cm nodular density over the right apex. Prominent right  paramediastinal density in the region of the azygos vein as adenopathy is possible. Recommend contrast-enhanced chest CT for further evaluation. Electronically Signed   By: Marin Olp M.D.   On: 11/21/2017 18:15    ASSESSMENT & PLAN:   Mass of upper lobe of right lung #72 year old female patient with long-standing history of smoking severe COPD/chronic respiratory failure with lung mass right upper lobe; positive for mediastinal adenopathy highly suspicious for primary lung cancer.   #Given patient's multiple comorbidities/poor performance status is baseline 3-4 on ECOG scale [patient homebound]-patient is not a good candidate for any procedures.  In the absence of any good treatment options-recommend hospice. Also discussed with pulmonologist Dr.Kasa; who also recommends hospice.   # Chronic pain- recommend tramadol 50 q 8 hours prn.   # Nasuea/ no brain mri-question brain mets versus others.  Would not recommend imaging as I do not think patient is a good candidate for any radiation even if she has brain mets.  For now prednisone 20 mg/day x2 weeks; and the STOP.  #A long discussion with the patient/daughter-in-law-agree with the plan.  She was given a copy of CT scan.   # I reviewed the blood work- with the patient in detail; also reviewed the imaging independently [as summarized above]; and with the patient in detail.   # 40 minutes face-to-face with the patient discussing the above plan of care; more than 50% of time spent on prognosis/ natural history; counseling and coordination.   #Hospice referral; follow-up as needed.   All questions were answered. The patient knows to call the clinic with any problems, questions or concerns.     Cammie Sickle, MD 12/09/2017 5:54 PM

## 2017-11-30 NOTE — Assessment & Plan Note (Addendum)
#  72 year old female patient with long-standing history of smoking severe COPD/chronic respiratory failure with lung mass right upper lobe; positive for mediastinal adenopathy highly suspicious for primary lung cancer.   #Given patient's multiple comorbidities/poor performance status is baseline 3-4 on ECOG scale [patient homebound]-patient is not a good candidate for any procedures.  In the absence of any good treatment options-recommend hospice. Also discussed with pulmonologist Dr.Kasa; who also recommends hospice.   # Chronic pain- recommend tramadol 50 q 8 hours prn.   # Nasuea/ no brain mri-question brain mets versus others.  Would not recommend imaging as I do not think patient is a good candidate for any radiation even if she has brain mets.  For now prednisone 20 mg/day x2 weeks; and the STOP.  #A long discussion with the patient/daughter-in-law-agree with the plan.  She was given a copy of CT scan.   # I reviewed the blood work- with the patient in detail; also reviewed the imaging independently [as summarized above]; and with the patient in detail.   # 40 minutes face-to-face with the patient discussing the above plan of care; more than 50% of time spent on prognosis/ natural history; counseling and coordination.   #Hospice referral; follow-up as needed.

## 2017-12-04 ENCOUNTER — Telehealth: Payer: Self-pay | Admitting: Internal Medicine

## 2017-12-04 NOTE — Telephone Encounter (Signed)
Per hospice [facility] , [facility] facility MD is providing the palliative care in facility and hospice will be unable to do so.     Questions or concerns  Melissa Watkins  [phone]

## 2017-12-07 ENCOUNTER — Telehealth: Payer: Self-pay | Admitting: *Deleted

## 2017-12-07 NOTE — Telephone Encounter (Signed)
Son Melissa Watkins called and requests a call back [phone]) stating that hospice has some questions before they will open her to services.  I called and spoke with hospice intake coordinator who states hte referral was rejected due to patient residing at [facility] and Dr Nyra Capes did not want Hospice care for the patient as he plans to do Palliative Care for her himself.

## 2017-12-11 NOTE — Telephone Encounter (Signed)
I left message for patient's son [name] to return phone call.

## 2017-12-11 NOTE — Telephone Encounter (Signed)
Patient's son returned phone call. He states that him & his family prefer hospice care over [facility] so she can be closer to the family. I contacted [place] and left a message for social worker, Baxter Flattery, to discuss Dr. Nyra Capes rejecting the hospice referral. I will await return phone call.

## 2017-12-12 NOTE — Telephone Encounter (Signed)
I left additional voicemail for Baxter Flattery to return phone call regarding patient and hospice referral.

## 2017-12-13 ENCOUNTER — Other Ambulatory Visit: Payer: Self-pay | Admitting: *Deleted

## 2017-12-13 NOTE — Patient Outreach (Signed)
[place] [facility]) Care Management  12/13/2017  Melissa Watkins 03-Sep-1945 [id number]   Spoke with discharge planner Ileene Patrick at [facility]  Desiree stated patient is receiving speech therapy to improve swallowing.  No discharge plan has been set for patient yet.   Will follow up next week.   Rutherford Limerick RN, BSN [place] Acute Care Coordinator [phone]) Business [employer] [phone]) Toll free office

## 2017-12-19 ENCOUNTER — Other Ambulatory Visit: Payer: Self-pay | Admitting: *Deleted

## 2017-12-19 NOTE — Patient Outreach (Signed)
[place] [facility]) Care Management  12/19/2017  MARLITA KEIL 1945/02/27 [id number]   Electronic medical record reveals patient's discharge plan is Hospice. [facility] Care Management services not appropriate at this time. If patient's post SNF needs change please place a [facility] Care Management consult. For questions please contact:   Athan Casalino RN, [facility] Liaison  [phone]) [employer] [phone]) Toll free office

## 2017-12-28 IMAGING — MR MR HEAD W/O CM
10 series · 48 of 48 positions shown · non-contrast
Comparison: Head CT 01/09/2017

CLINICAL DATA: Fall

EXAM:
MRI HEAD WITHOUT CONTRAST
TECHNIQUE: Multiplanar, multiecho pulse sequences of the brain and surrounding
structures were obtained without intravenous contrast.

[Series 3: DWI · axial · 3.0mm · 1.80mm/px · z∈[-56,+109]mm · 6 of 57 slices shown (1 of 2)]
[im 1/57]
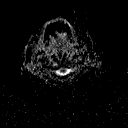
[im 12/57]
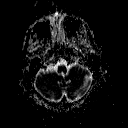
[im 23/57]
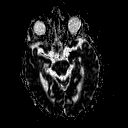
[im 34/57]
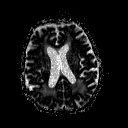
[im 45/57]
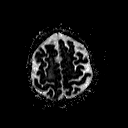
[im 57/57]
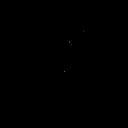

[Series 5: DWI · coronal · 3.0mm · 1.80mm/px · 6 of 49 slices shown (2 of 2)]
[im 1/49]
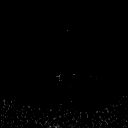
[im 10/49]
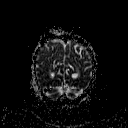
[im 20/49]
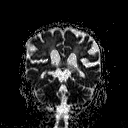
[im 29/49]
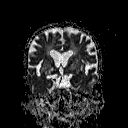
[im 39/49]
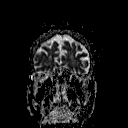
[im 49/49]
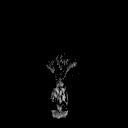

[Series 6: T1 · sagittal · 5.0mm · 0.45mm/px · 3 of 29 slices shown (1 of 2)]
[im 1/29]
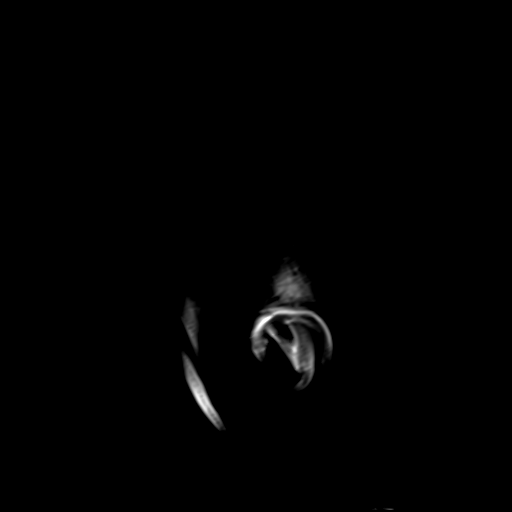
[im 15/29]
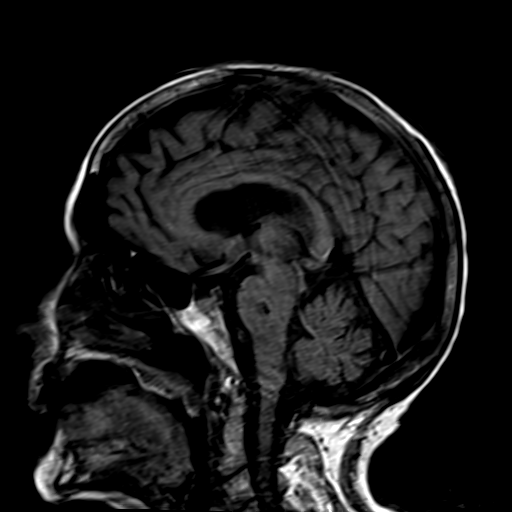
[im 29/29]
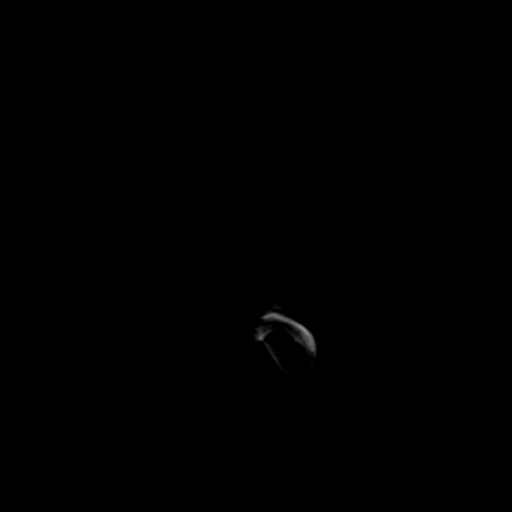

[Series 7: T2 · axial · 5.0mm · 0.90mm/px · z∈[-58,+108]mm · 3 of 27 slices shown (1 of 3)]
[im 1/27]
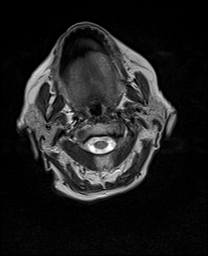
[im 14/27]
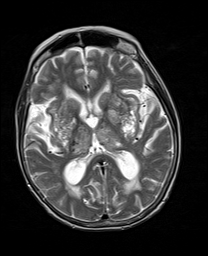
[im 27/27]
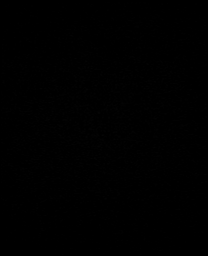

[Series 8: FLAIR · axial · 5.0mm · 0.45mm/px · z∈[-58,+108]mm · 3 of 27 slices shown]
[im 1/27]
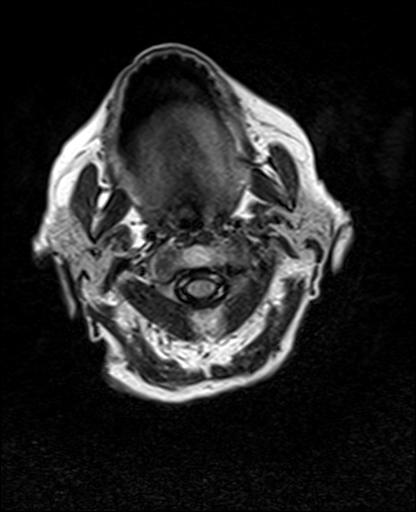
[im 14/27]
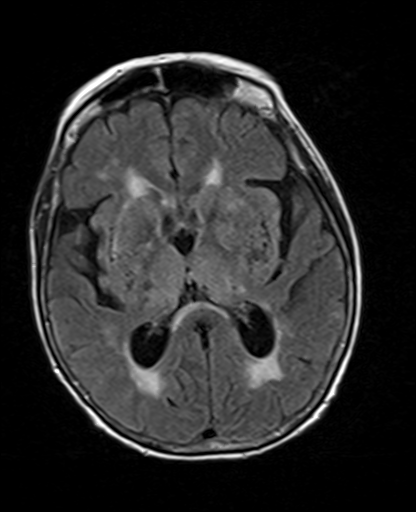
[im 27/27]
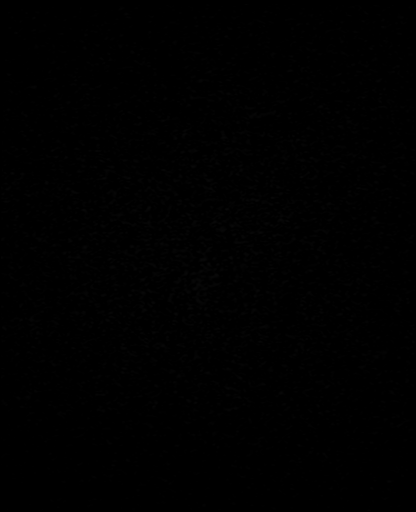

[Series 9: T2 · axial · 5.0mm · 0.45mm/px · z∈[-58,+108]mm · 3 of 27 slices shown (2 of 3)]
[im 1/27]
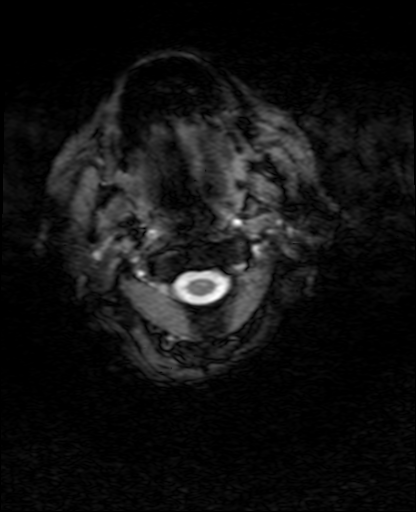
[im 14/27]
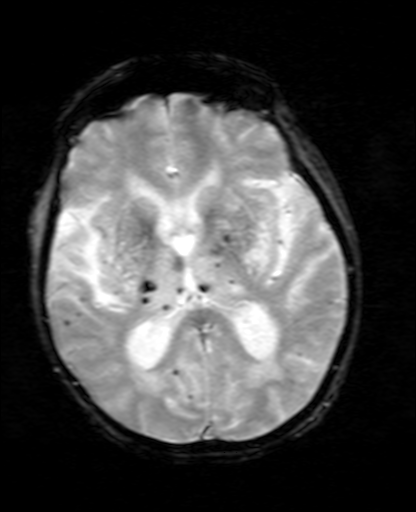
[im 27/27]
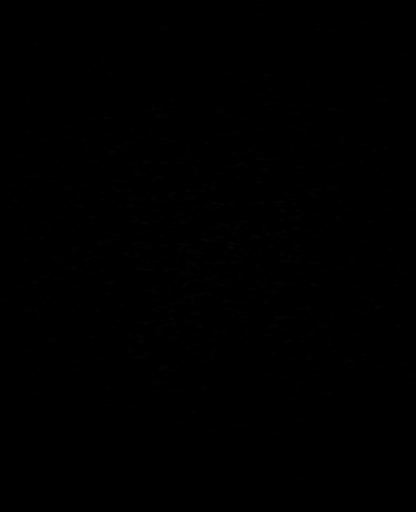

[Series 10: T1 · axial · 3.0mm · 1.00mm/px · z∈[-54,+108]mm · 7 of 56 slices shown (2 of 2)]
[im 1/56]
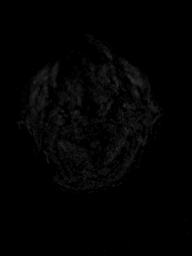
[im 10/56]
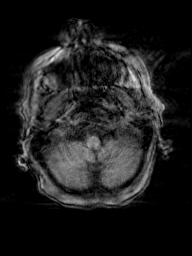
[im 19/56]
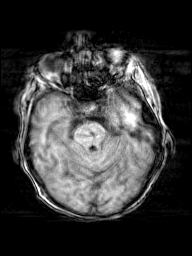
[im 28/56]
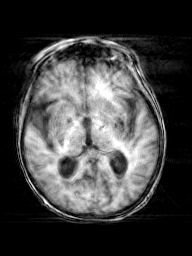
[im 37/56]
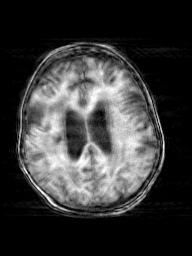
[im 46/56]
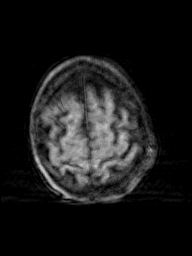
[im 56/56]
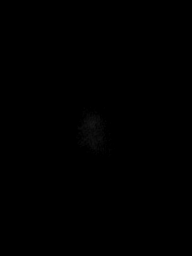

[Series 11: T2 · coronal · 5.0mm · 0.86mm/px · 4 of 30 slices shown (3 of 3)]
[im 1/30]
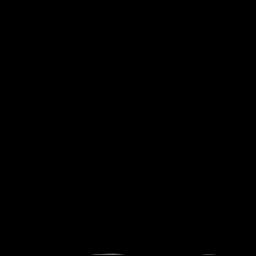
[im 10/30]
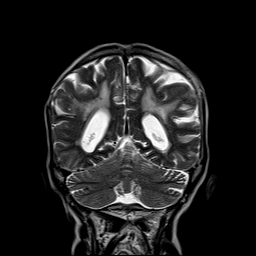
[im 20/30]
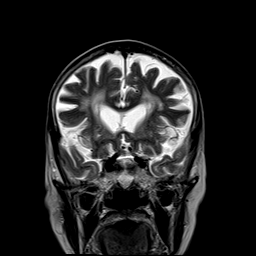
[im 30/30]
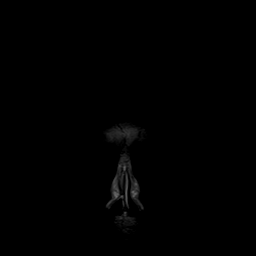

[Series 100: ax (id) · axial · 3.0mm · 1.80mm/px · z∈[-56,+109]mm · 7 of 57 slices shown]
[im 1/57]
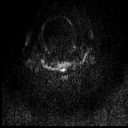
[im 10/57]
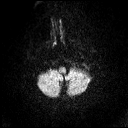
[im 19/57]
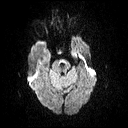
[im 29/57]
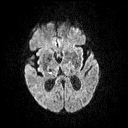
[im 38/57]
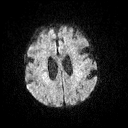
[im 47/57]
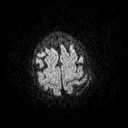
[im 57/57]
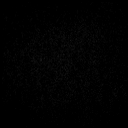

[Series 101: cor (id) · coronal · 3.0mm · 1.80mm/px · 6 of 49 slices shown]
[im 1/49]
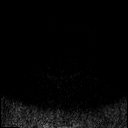
[im 10/49]
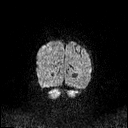
[im 20/49]
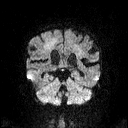
[im 29/49]
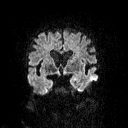
[im 39/49]
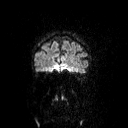
[im 49/49]
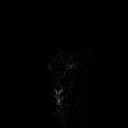

[48 of 48 positions shown; findings below may reference images not displayed]

FINDINGS: Brain: The midline structures are normal. There is no acute infarct
or acute hemorrhage. No mass lesion, hydrocephalus, dural
abnormality or extra-axial collection. There is diffuse confluent
hyperintense T2-weighted signal within the periventricular, deep and
juxtacortical white matter, most often seen in the setting of
chronic microvascular ischemia. No age-advanced or lobar predominant
atrophy. There are numerous bilateral central and peripheral
scattered foci of chronic microhemorrhage, the largest which are
located in the left thalamus and left pons at the areas of
abnormalities seen on the earlier CT.

Vascular: Major intracranial arterial and venous sinus flow voids
are preserved.

Skull and upper cervical spine: The visualized skull base,
calvarium, upper cervical spine and extracranial soft tissues are
normal.

Sinuses/Orbits: No fluid levels or advanced mucosal thickening. No
mastoid or middle ear effusion. Normal orbits.
IMPRESSION: 1. Numerous foci of chronic microhemorrhage scattered throughout
both cerebellar and cerebral hemispheres, centrally and
peripherally, with the 2 largest foci corresponding to the
abnormalities identified in the pons and left thalamus on the
earlier head CT. In the context of the numerous chronic micro
hemorrhages, these are most likely acute or subacute hemorrhagic
foci. A pontine cavernoma could also have this appearance.
2. Advanced white matter changes of chronic ischemic
microangiopathy.

## 2017-12-30 DEATH — deceased

## 2019-02-28 ENCOUNTER — Telehealth: Payer: Self-pay

## 2019-02-28 NOTE — Telephone Encounter (Signed)
Received medical records request from [employer].  Request has been forwarded to [facility] via epic. Received successful fax confirmation.
